# Patient Record
Sex: Male | Born: 1935 | Race: White | Hispanic: No | Marital: Single | Smoking: Former smoker
Health system: Southern US, Community
[De-identification: ages and names within clinical notes are randomized; demographics above are authoritative.]

## PROBLEM LIST (undated history)

## (undated) DIAGNOSIS — R0602 Shortness of breath: Secondary | ICD-10-CM

## (undated) DIAGNOSIS — I5042 Chronic combined systolic (congestive) and diastolic (congestive) heart failure: Secondary | ICD-10-CM

## (undated) DIAGNOSIS — I714 Abdominal aortic aneurysm, without rupture, unspecified: Secondary | ICD-10-CM

## (undated) DIAGNOSIS — I10 Essential (primary) hypertension: Secondary | ICD-10-CM

## (undated) DIAGNOSIS — E785 Hyperlipidemia, unspecified: Secondary | ICD-10-CM

## (undated) DIAGNOSIS — I2510 Atherosclerotic heart disease of native coronary artery without angina pectoris: Secondary | ICD-10-CM

## (undated) DIAGNOSIS — I255 Ischemic cardiomyopathy: Secondary | ICD-10-CM

## (undated) DIAGNOSIS — I480 Paroxysmal atrial fibrillation: Secondary | ICD-10-CM

## (undated) DIAGNOSIS — K219 Gastro-esophageal reflux disease without esophagitis: Secondary | ICD-10-CM

## (undated) DIAGNOSIS — J449 Chronic obstructive pulmonary disease, unspecified: Secondary | ICD-10-CM

## (undated) DIAGNOSIS — G4730 Sleep apnea, unspecified: Secondary | ICD-10-CM

## (undated) DIAGNOSIS — J8410 Pulmonary fibrosis, unspecified: Secondary | ICD-10-CM

## (undated) DIAGNOSIS — I350 Nonrheumatic aortic (valve) stenosis: Secondary | ICD-10-CM

## (undated) HISTORY — PX: LEG SURGERY: SHX1003

## (undated) HISTORY — DX: Sleep apnea, unspecified: G47.30

## (undated) HISTORY — PX: OTHER SURGICAL HISTORY: SHX169

## (undated) HISTORY — DX: Abdominal aortic aneurysm, without rupture: I71.4

## (undated) HISTORY — DX: Chronic obstructive pulmonary disease, unspecified: J44.9

## (undated) HISTORY — DX: Hyperlipidemia, unspecified: E78.5

## (undated) HISTORY — DX: Essential (primary) hypertension: I10

## (undated) HISTORY — DX: Abdominal aortic aneurysm, without rupture, unspecified: I71.40

## (undated) HISTORY — PX: NECK SURGERY: SHX720

## (undated) HISTORY — PX: BACK SURGERY: SHX140

## (undated) HISTORY — DX: Pulmonary fibrosis, unspecified: J84.10

## (undated) HISTORY — DX: Nonrheumatic aortic (valve) stenosis: I35.0

---

## 1998-01-10 ENCOUNTER — Emergency Department (HOSPITAL_COMMUNITY): Admission: EM | Admit: 1998-01-10 | Discharge: 1998-01-10 | Payer: Self-pay | Admitting: Emergency Medicine

## 1998-03-10 ENCOUNTER — Emergency Department (HOSPITAL_COMMUNITY): Admission: EM | Admit: 1998-03-10 | Discharge: 1998-03-10 | Payer: Self-pay | Admitting: Emergency Medicine

## 1998-12-12 ENCOUNTER — Other Ambulatory Visit: Admission: RE | Admit: 1998-12-12 | Discharge: 1998-12-12 | Payer: Self-pay | Admitting: Gastroenterology

## 1999-11-19 ENCOUNTER — Encounter: Payer: Self-pay | Admitting: Cardiology

## 1999-11-19 ENCOUNTER — Ambulatory Visit (HOSPITAL_COMMUNITY): Admission: RE | Admit: 1999-11-19 | Discharge: 1999-11-19 | Payer: Self-pay | Admitting: Cardiology

## 2001-03-26 ENCOUNTER — Emergency Department (HOSPITAL_COMMUNITY): Admission: EM | Admit: 2001-03-26 | Discharge: 2001-03-26 | Payer: Self-pay | Admitting: Emergency Medicine

## 2001-03-26 ENCOUNTER — Encounter: Payer: Self-pay | Admitting: Emergency Medicine

## 2004-05-11 ENCOUNTER — Emergency Department (HOSPITAL_COMMUNITY): Admission: EM | Admit: 2004-05-11 | Discharge: 2004-05-11 | Payer: Self-pay | Admitting: Emergency Medicine

## 2004-06-19 ENCOUNTER — Ambulatory Visit: Payer: Self-pay | Admitting: Internal Medicine

## 2004-07-02 ENCOUNTER — Ambulatory Visit: Payer: Self-pay | Admitting: Internal Medicine

## 2004-07-02 ENCOUNTER — Ambulatory Visit: Payer: Self-pay | Admitting: Endocrinology

## 2004-09-30 ENCOUNTER — Ambulatory Visit: Payer: Self-pay | Admitting: Internal Medicine

## 2004-11-12 ENCOUNTER — Ambulatory Visit: Payer: Self-pay | Admitting: Internal Medicine

## 2005-01-07 ENCOUNTER — Ambulatory Visit: Payer: Self-pay | Admitting: Internal Medicine

## 2005-06-17 ENCOUNTER — Ambulatory Visit: Payer: Self-pay | Admitting: Internal Medicine

## 2005-06-24 ENCOUNTER — Ambulatory Visit: Payer: Self-pay | Admitting: Family Medicine

## 2005-12-11 ENCOUNTER — Ambulatory Visit: Payer: Self-pay | Admitting: Family Medicine

## 2006-01-09 ENCOUNTER — Ambulatory Visit: Payer: Self-pay | Admitting: Family Medicine

## 2006-03-18 ENCOUNTER — Ambulatory Visit: Payer: Self-pay | Admitting: Family Medicine

## 2006-03-25 ENCOUNTER — Encounter: Payer: Self-pay | Admitting: Cardiology

## 2006-03-25 ENCOUNTER — Ambulatory Visit: Payer: Self-pay

## 2006-04-04 ENCOUNTER — Encounter (INDEPENDENT_AMBULATORY_CARE_PROVIDER_SITE_OTHER): Payer: Self-pay | Admitting: *Deleted

## 2006-04-04 ENCOUNTER — Emergency Department (HOSPITAL_COMMUNITY): Admission: EM | Admit: 2006-04-04 | Discharge: 2006-04-04 | Payer: Self-pay | Admitting: Emergency Medicine

## 2006-05-15 ENCOUNTER — Ambulatory Visit: Payer: Self-pay | Admitting: Cardiology

## 2006-06-30 ENCOUNTER — Ambulatory Visit: Payer: Self-pay | Admitting: Family Medicine

## 2006-07-15 ENCOUNTER — Ambulatory Visit: Payer: Self-pay | Admitting: Family Medicine

## 2006-07-15 LAB — CONVERTED CEMR LAB
ALT: 19 units/L (ref 0–40)
AST: 25 units/L (ref 0–37)
Albumin: 3.7 g/dL (ref 3.5–5.2)
Alkaline Phosphatase: 58 units/L (ref 39–117)
BUN: 21 mg/dL (ref 6–23)
GFR calc non Af Amer: 63 mL/min
Glomerular Filtration Rate, Af Am: 76 mL/min/{1.73_m2}
Sodium: 141 meq/L (ref 135–145)
Total Bilirubin: 0.7 mg/dL (ref 0.3–1.2)
Total Protein: 7.3 g/dL (ref 6.0–8.3)
VLDL: 24 mg/dL (ref 0–40)

## 2006-07-16 ENCOUNTER — Ambulatory Visit: Payer: Self-pay | Admitting: Cardiology

## 2006-07-17 ENCOUNTER — Ambulatory Visit: Payer: Self-pay | Admitting: Family Medicine

## 2006-10-26 ENCOUNTER — Ambulatory Visit: Payer: Self-pay | Admitting: Internal Medicine

## 2006-10-27 ENCOUNTER — Encounter: Payer: Self-pay | Admitting: Internal Medicine

## 2006-11-17 ENCOUNTER — Ambulatory Visit: Payer: Self-pay | Admitting: Family Medicine

## 2006-11-17 LAB — CONVERTED CEMR LAB
Eosinophils Absolute: 0.3 10*3/uL (ref 0.0–0.6)
Eosinophils Relative: 4.5 % (ref 0.0–5.0)
Hemoglobin: 14.5 g/dL (ref 13.0–17.0)
MCHC: 33.3 g/dL (ref 30.0–36.0)
Monocytes Relative: 7.2 % (ref 3.0–11.0)
Neutrophils Relative %: 58.5 % (ref 43.0–77.0)
RBC: 5.16 M/uL (ref 4.22–5.81)
TSH: 1.38 microintl units/mL (ref 0.35–5.50)

## 2006-12-24 ENCOUNTER — Ambulatory Visit (HOSPITAL_BASED_OUTPATIENT_CLINIC_OR_DEPARTMENT_OTHER): Admission: RE | Admit: 2006-12-24 | Discharge: 2006-12-24 | Payer: Self-pay | Admitting: Internal Medicine

## 2006-12-24 ENCOUNTER — Encounter: Payer: Self-pay | Admitting: Internal Medicine

## 2007-01-02 ENCOUNTER — Ambulatory Visit: Payer: Self-pay | Admitting: Pulmonary Disease

## 2007-02-10 ENCOUNTER — Ambulatory Visit: Payer: Self-pay | Admitting: Vascular Surgery

## 2007-06-04 ENCOUNTER — Ambulatory Visit: Payer: Self-pay | Admitting: Cardiology

## 2007-06-04 ENCOUNTER — Telehealth (INDEPENDENT_AMBULATORY_CARE_PROVIDER_SITE_OTHER): Payer: Self-pay | Admitting: *Deleted

## 2007-06-04 DIAGNOSIS — J984 Other disorders of lung: Secondary | ICD-10-CM | POA: Insufficient documentation

## 2007-06-14 ENCOUNTER — Ambulatory Visit: Payer: Self-pay | Admitting: Cardiology

## 2007-06-18 ENCOUNTER — Ambulatory Visit: Payer: Self-pay | Admitting: Family Medicine

## 2007-06-21 ENCOUNTER — Ambulatory Visit: Payer: Self-pay | Admitting: Cardiology

## 2007-06-22 ENCOUNTER — Telehealth (INDEPENDENT_AMBULATORY_CARE_PROVIDER_SITE_OTHER): Payer: Self-pay | Admitting: *Deleted

## 2007-06-29 ENCOUNTER — Encounter: Payer: Self-pay | Admitting: Cardiology

## 2007-06-29 ENCOUNTER — Ambulatory Visit: Payer: Self-pay

## 2007-06-29 ENCOUNTER — Encounter: Payer: Self-pay | Admitting: Pulmonary Disease

## 2007-06-29 ENCOUNTER — Ambulatory Visit: Payer: Self-pay | Admitting: Cardiology

## 2007-07-06 ENCOUNTER — Ambulatory Visit: Payer: Self-pay | Admitting: Pulmonary Disease

## 2007-07-20 ENCOUNTER — Ambulatory Visit: Payer: Self-pay | Admitting: Internal Medicine

## 2007-08-11 ENCOUNTER — Ambulatory Visit: Payer: Self-pay | Admitting: Internal Medicine

## 2007-08-11 ENCOUNTER — Ambulatory Visit (HOSPITAL_COMMUNITY): Admission: RE | Admit: 2007-08-11 | Discharge: 2007-08-11 | Payer: Self-pay | Admitting: Internal Medicine

## 2007-08-13 ENCOUNTER — Ambulatory Visit: Payer: Self-pay | Admitting: Pulmonary Disease

## 2007-08-13 DIAGNOSIS — I509 Heart failure, unspecified: Secondary | ICD-10-CM | POA: Insufficient documentation

## 2007-08-13 DIAGNOSIS — G4730 Sleep apnea, unspecified: Secondary | ICD-10-CM | POA: Insufficient documentation

## 2007-08-13 DIAGNOSIS — R599 Enlarged lymph nodes, unspecified: Secondary | ICD-10-CM | POA: Insufficient documentation

## 2007-08-13 DIAGNOSIS — J84111 Idiopathic interstitial pneumonia, not otherwise specified: Secondary | ICD-10-CM | POA: Insufficient documentation

## 2007-08-13 DIAGNOSIS — I2510 Atherosclerotic heart disease of native coronary artery without angina pectoris: Secondary | ICD-10-CM | POA: Insufficient documentation

## 2007-09-27 ENCOUNTER — Encounter: Payer: Self-pay | Admitting: Family Medicine

## 2007-09-27 ENCOUNTER — Inpatient Hospital Stay (HOSPITAL_COMMUNITY): Admission: EM | Admit: 2007-09-27 | Discharge: 2007-09-29 | Payer: Self-pay | Admitting: Emergency Medicine

## 2007-09-27 ENCOUNTER — Ambulatory Visit: Payer: Self-pay | Admitting: Internal Medicine

## 2007-10-12 ENCOUNTER — Ambulatory Visit: Payer: Self-pay | Admitting: Family Medicine

## 2007-10-12 DIAGNOSIS — J209 Acute bronchitis, unspecified: Secondary | ICD-10-CM | POA: Insufficient documentation

## 2007-10-12 DIAGNOSIS — I714 Abdominal aortic aneurysm, without rupture, unspecified: Secondary | ICD-10-CM | POA: Insufficient documentation

## 2007-10-12 DIAGNOSIS — I10 Essential (primary) hypertension: Secondary | ICD-10-CM

## 2007-10-12 DIAGNOSIS — E785 Hyperlipidemia, unspecified: Secondary | ICD-10-CM | POA: Insufficient documentation

## 2007-12-31 ENCOUNTER — Ambulatory Visit: Payer: Self-pay | Admitting: Cardiology

## 2008-01-03 ENCOUNTER — Telehealth (INDEPENDENT_AMBULATORY_CARE_PROVIDER_SITE_OTHER): Payer: Self-pay | Admitting: *Deleted

## 2008-01-24 ENCOUNTER — Encounter: Payer: Self-pay | Admitting: Internal Medicine

## 2008-01-31 ENCOUNTER — Ambulatory Visit: Payer: Self-pay | Admitting: Pulmonary Disease

## 2008-01-31 DIAGNOSIS — F17200 Nicotine dependence, unspecified, uncomplicated: Secondary | ICD-10-CM | POA: Insufficient documentation

## 2008-02-15 ENCOUNTER — Ambulatory Visit: Payer: Self-pay | Admitting: Vascular Surgery

## 2008-02-25 ENCOUNTER — Telehealth (INDEPENDENT_AMBULATORY_CARE_PROVIDER_SITE_OTHER): Payer: Self-pay | Admitting: *Deleted

## 2008-04-21 ENCOUNTER — Telehealth: Payer: Self-pay | Admitting: Family Medicine

## 2008-05-22 ENCOUNTER — Ambulatory Visit: Payer: Self-pay | Admitting: Family Medicine

## 2008-05-22 ENCOUNTER — Telehealth (INDEPENDENT_AMBULATORY_CARE_PROVIDER_SITE_OTHER): Payer: Self-pay | Admitting: *Deleted

## 2008-05-22 DIAGNOSIS — I83893 Varicose veins of bilateral lower extremities with other complications: Secondary | ICD-10-CM | POA: Insufficient documentation

## 2008-05-29 ENCOUNTER — Ambulatory Visit: Payer: Self-pay | Admitting: Family Medicine

## 2008-05-29 DIAGNOSIS — J189 Pneumonia, unspecified organism: Secondary | ICD-10-CM

## 2008-06-12 ENCOUNTER — Ambulatory Visit: Payer: Self-pay | Admitting: Family Medicine

## 2008-07-03 ENCOUNTER — Ambulatory Visit: Payer: Self-pay | Admitting: Family Medicine

## 2008-09-12 ENCOUNTER — Ambulatory Visit: Payer: Self-pay | Admitting: Family Medicine

## 2008-09-13 ENCOUNTER — Ambulatory Visit: Payer: Self-pay | Admitting: Family Medicine

## 2008-09-27 LAB — CONVERTED CEMR LAB
ALT: 16 units/L (ref 0–53)
Albumin: 3.7 g/dL (ref 3.5–5.2)
BUN: 18 mg/dL (ref 6–23)
CO2: 32 meq/L (ref 19–32)
Calcium: 9.3 mg/dL (ref 8.4–10.5)
Cholesterol: 237 mg/dL (ref 0–200)
Creatinine, Ser: 1 mg/dL (ref 0.4–1.5)
Direct LDL: 169.3 mg/dL
Glucose, Bld: 102 mg/dL — ABNORMAL HIGH (ref 70–99)
Total Bilirubin: 0.9 mg/dL (ref 0.3–1.2)
Total CHOL/HDL Ratio: 8.3
Total Protein: 7.1 g/dL (ref 6.0–8.3)
Triglycerides: 184 mg/dL — ABNORMAL HIGH (ref 0–149)

## 2008-09-28 ENCOUNTER — Encounter (INDEPENDENT_AMBULATORY_CARE_PROVIDER_SITE_OTHER): Payer: Self-pay | Admitting: *Deleted

## 2008-10-28 ENCOUNTER — Ambulatory Visit: Payer: Self-pay | Admitting: Diagnostic Radiology

## 2008-10-28 ENCOUNTER — Emergency Department (HOSPITAL_BASED_OUTPATIENT_CLINIC_OR_DEPARTMENT_OTHER): Admission: EM | Admit: 2008-10-28 | Discharge: 2008-10-28 | Payer: Self-pay | Admitting: Emergency Medicine

## 2008-11-15 ENCOUNTER — Ambulatory Visit: Payer: Self-pay | Admitting: Family Medicine

## 2008-11-16 ENCOUNTER — Telehealth (INDEPENDENT_AMBULATORY_CARE_PROVIDER_SITE_OTHER): Payer: Self-pay | Admitting: *Deleted

## 2008-11-20 ENCOUNTER — Telehealth (INDEPENDENT_AMBULATORY_CARE_PROVIDER_SITE_OTHER): Payer: Self-pay | Admitting: *Deleted

## 2008-11-23 ENCOUNTER — Ambulatory Visit: Payer: Self-pay | Admitting: Family Medicine

## 2008-12-01 ENCOUNTER — Ambulatory Visit: Payer: Self-pay | Admitting: Pulmonary Disease

## 2009-01-12 ENCOUNTER — Telehealth (INDEPENDENT_AMBULATORY_CARE_PROVIDER_SITE_OTHER): Payer: Self-pay | Admitting: *Deleted

## 2009-01-15 ENCOUNTER — Telehealth (INDEPENDENT_AMBULATORY_CARE_PROVIDER_SITE_OTHER): Payer: Self-pay | Admitting: *Deleted

## 2009-02-20 ENCOUNTER — Ambulatory Visit: Payer: Self-pay | Admitting: Vascular Surgery

## 2009-03-09 ENCOUNTER — Telehealth (INDEPENDENT_AMBULATORY_CARE_PROVIDER_SITE_OTHER): Payer: Self-pay | Admitting: *Deleted

## 2009-03-27 ENCOUNTER — Ambulatory Visit: Payer: Self-pay | Admitting: Family Medicine

## 2009-03-27 DIAGNOSIS — W57XXXA Bitten or stung by nonvenomous insect and other nonvenomous arthropods, initial encounter: Secondary | ICD-10-CM

## 2009-03-27 DIAGNOSIS — T148 Other injury of unspecified body region: Secondary | ICD-10-CM | POA: Insufficient documentation

## 2009-03-29 LAB — CONVERTED CEMR LAB
Basophils Absolute: 0 10*3/uL (ref 0.0–0.1)
Eosinophils Relative: 8.4 % — ABNORMAL HIGH (ref 0.0–5.0)
Monocytes Relative: 5.9 % (ref 3.0–12.0)
Neutrophils Relative %: 55.4 % (ref 43.0–77.0)
Platelets: 158 10*3/uL (ref 150.0–400.0)
WBC: 5.8 10*3/uL (ref 4.5–10.5)

## 2009-04-02 ENCOUNTER — Telehealth (INDEPENDENT_AMBULATORY_CARE_PROVIDER_SITE_OTHER): Payer: Self-pay | Admitting: *Deleted

## 2009-04-02 ENCOUNTER — Encounter (INDEPENDENT_AMBULATORY_CARE_PROVIDER_SITE_OTHER): Payer: Self-pay | Admitting: *Deleted

## 2009-05-10 ENCOUNTER — Ambulatory Visit: Payer: Self-pay | Admitting: Family Medicine

## 2009-05-24 ENCOUNTER — Telehealth (INDEPENDENT_AMBULATORY_CARE_PROVIDER_SITE_OTHER): Payer: Self-pay | Admitting: *Deleted

## 2009-06-11 ENCOUNTER — Ambulatory Visit: Payer: Self-pay | Admitting: Family Medicine

## 2009-06-11 ENCOUNTER — Telehealth: Payer: Self-pay | Admitting: Family Medicine

## 2009-06-12 ENCOUNTER — Ambulatory Visit (HOSPITAL_BASED_OUTPATIENT_CLINIC_OR_DEPARTMENT_OTHER): Admission: RE | Admit: 2009-06-12 | Discharge: 2009-06-12 | Payer: Self-pay | Admitting: Family Medicine

## 2009-06-12 ENCOUNTER — Ambulatory Visit: Payer: Self-pay | Admitting: Diagnostic Radiology

## 2009-06-14 ENCOUNTER — Telehealth (INDEPENDENT_AMBULATORY_CARE_PROVIDER_SITE_OTHER): Payer: Self-pay | Admitting: *Deleted

## 2009-06-21 ENCOUNTER — Telehealth (INDEPENDENT_AMBULATORY_CARE_PROVIDER_SITE_OTHER): Payer: Self-pay | Admitting: *Deleted

## 2009-07-24 ENCOUNTER — Ambulatory Visit: Payer: Self-pay | Admitting: Family Medicine

## 2009-07-24 DIAGNOSIS — D239 Other benign neoplasm of skin, unspecified: Secondary | ICD-10-CM | POA: Insufficient documentation

## 2009-07-24 DIAGNOSIS — R413 Other amnesia: Secondary | ICD-10-CM | POA: Insufficient documentation

## 2009-07-25 ENCOUNTER — Telehealth: Payer: Self-pay | Admitting: Family Medicine

## 2009-07-25 LAB — CONVERTED CEMR LAB
AST: 20 units/L (ref 0–37)
Albumin: 4.1 g/dL (ref 3.5–5.2)
BUN: 27 mg/dL — ABNORMAL HIGH (ref 6–23)
Basophils Absolute: 0 10*3/uL (ref 0.0–0.1)
CO2: 30 meq/L (ref 19–32)
Calcium: 9.7 mg/dL (ref 8.4–10.5)
Direct LDL: 185.1 mg/dL
Eosinophils Absolute: 1.1 10*3/uL — ABNORMAL HIGH (ref 0.0–0.7)
Folate: 19.6 ng/mL
GFR calc non Af Amer: 68.84 mL/min (ref >60)
Glucose, Bld: 104 mg/dL — ABNORMAL HIGH (ref 70–99)
HCT: 45 % (ref 39.0–52.0)
HDL: 33.8 mg/dL — ABNORMAL LOW (ref >39.00)
Lymphs Abs: 1.9 10*3/uL (ref 0.7–4.0)
MCHC: 32.3 g/dL (ref 30.0–36.0)
Monocytes Relative: 7.5 % (ref 3.0–12.0)
Neutro Abs: 3.9 10*3/uL (ref 1.4–7.7)
Platelets: 151 10*3/uL (ref 150.0–400.0)
Potassium: 4.7 meq/L (ref 3.5–5.1)
RDW: 14.1 % (ref 11.5–14.6)
TSH: 0.99 microintl units/mL (ref 0.35–5.50)
Total Bilirubin: 0.9 mg/dL (ref 0.3–1.2)
Triglycerides: 165 mg/dL — ABNORMAL HIGH (ref 0.0–149.0)
Vitamin B-12: 971 pg/mL — ABNORMAL HIGH (ref 211–911)

## 2009-07-30 ENCOUNTER — Ambulatory Visit: Payer: Self-pay | Admitting: Family Medicine

## 2009-07-30 ENCOUNTER — Encounter: Payer: Self-pay | Admitting: Internal Medicine

## 2009-08-01 ENCOUNTER — Telehealth: Payer: Self-pay | Admitting: Internal Medicine

## 2009-08-01 ENCOUNTER — Telehealth (INDEPENDENT_AMBULATORY_CARE_PROVIDER_SITE_OTHER): Payer: Self-pay | Admitting: *Deleted

## 2009-09-25 ENCOUNTER — Ambulatory Visit: Payer: Self-pay | Admitting: Family Medicine

## 2009-09-25 ENCOUNTER — Telehealth: Payer: Self-pay | Admitting: Family Medicine

## 2009-09-25 LAB — CONVERTED CEMR LAB
Alkaline Phosphatase: 142 units/L — ABNORMAL HIGH (ref 39–117)
Bilirubin, Direct: 0 mg/dL (ref 0.0–0.3)
Total Bilirubin: 0.3 mg/dL (ref 0.3–1.2)
Total Protein: 8.2 g/dL (ref 6.0–8.3)

## 2009-12-11 ENCOUNTER — Ambulatory Visit: Payer: Self-pay | Admitting: Family Medicine

## 2009-12-11 DIAGNOSIS — I739 Peripheral vascular disease, unspecified: Secondary | ICD-10-CM

## 2009-12-11 DIAGNOSIS — R748 Abnormal levels of other serum enzymes: Secondary | ICD-10-CM | POA: Insufficient documentation

## 2009-12-11 DIAGNOSIS — I8310 Varicose veins of unspecified lower extremity with inflammation: Secondary | ICD-10-CM | POA: Insufficient documentation

## 2009-12-12 ENCOUNTER — Telehealth (INDEPENDENT_AMBULATORY_CARE_PROVIDER_SITE_OTHER): Payer: Self-pay | Admitting: *Deleted

## 2009-12-12 LAB — CONVERTED CEMR LAB
Alkaline Phosphatase: 81 units/L (ref 39–117)
Bilirubin, Direct: 0.1 mg/dL (ref 0.0–0.3)
Calcium: 9.2 mg/dL (ref 8.4–10.5)
Creatinine, Ser: 1.2 mg/dL (ref 0.4–1.5)
GFR calc non Af Amer: 65.33 mL/min (ref >60)
HDL: 33.5 mg/dL — ABNORMAL LOW (ref >39.00)
LDL Cholesterol: 138 mg/dL — ABNORMAL HIGH (ref 0–99)
Sodium: 145 meq/L (ref 135–145)
Total Bilirubin: 0.6 mg/dL (ref 0.3–1.2)
Total CHOL/HDL Ratio: 6
Total Protein: 7.1 g/dL (ref 6.0–8.3)
Triglycerides: 126 mg/dL (ref 0.0–149.0)
VLDL: 25.2 mg/dL (ref 0.0–40.0)

## 2009-12-17 ENCOUNTER — Encounter: Payer: Self-pay | Admitting: Family Medicine

## 2010-01-01 ENCOUNTER — Telehealth: Payer: Self-pay | Admitting: Family Medicine

## 2010-01-01 ENCOUNTER — Ambulatory Visit: Payer: Self-pay | Admitting: Vascular Surgery

## 2010-01-01 ENCOUNTER — Encounter: Payer: Self-pay | Admitting: Family Medicine

## 2010-01-21 ENCOUNTER — Telehealth (INDEPENDENT_AMBULATORY_CARE_PROVIDER_SITE_OTHER): Payer: Self-pay | Admitting: *Deleted

## 2010-01-23 ENCOUNTER — Ambulatory Visit: Payer: Self-pay | Admitting: Family Medicine

## 2010-02-05 ENCOUNTER — Ambulatory Visit: Payer: Self-pay | Admitting: Family Medicine

## 2010-02-05 ENCOUNTER — Ambulatory Visit: Payer: Self-pay | Admitting: Diagnostic Radiology

## 2010-02-05 ENCOUNTER — Ambulatory Visit (HOSPITAL_BASED_OUTPATIENT_CLINIC_OR_DEPARTMENT_OTHER): Admission: RE | Admit: 2010-02-05 | Discharge: 2010-02-05 | Payer: Self-pay | Admitting: Family Medicine

## 2010-04-09 ENCOUNTER — Ambulatory Visit: Payer: Self-pay | Admitting: Vascular Surgery

## 2010-04-15 ENCOUNTER — Ambulatory Visit: Payer: Self-pay | Admitting: Vascular Surgery

## 2010-04-18 ENCOUNTER — Telehealth (INDEPENDENT_AMBULATORY_CARE_PROVIDER_SITE_OTHER): Payer: Self-pay | Admitting: *Deleted

## 2010-04-19 ENCOUNTER — Ambulatory Visit: Payer: Self-pay | Admitting: Family Medicine

## 2010-04-22 ENCOUNTER — Ambulatory Visit: Payer: Self-pay | Admitting: Vascular Surgery

## 2010-05-01 ENCOUNTER — Telehealth (INDEPENDENT_AMBULATORY_CARE_PROVIDER_SITE_OTHER): Payer: Self-pay | Admitting: *Deleted

## 2010-05-01 ENCOUNTER — Ambulatory Visit (HOSPITAL_BASED_OUTPATIENT_CLINIC_OR_DEPARTMENT_OTHER): Admission: RE | Admit: 2010-05-01 | Discharge: 2010-05-01 | Payer: Self-pay | Admitting: Family Medicine

## 2010-05-01 ENCOUNTER — Ambulatory Visit: Payer: Self-pay | Admitting: Diagnostic Radiology

## 2010-05-02 ENCOUNTER — Telehealth (INDEPENDENT_AMBULATORY_CARE_PROVIDER_SITE_OTHER): Payer: Self-pay | Admitting: *Deleted

## 2010-05-20 ENCOUNTER — Telehealth: Payer: Self-pay | Admitting: Family Medicine

## 2010-05-21 ENCOUNTER — Ambulatory Visit: Payer: Self-pay | Admitting: Family Medicine

## 2010-07-10 ENCOUNTER — Ambulatory Visit: Payer: Self-pay | Admitting: Family Medicine

## 2010-07-19 ENCOUNTER — Ambulatory Visit: Payer: Self-pay | Admitting: Family Medicine

## 2010-07-19 ENCOUNTER — Telehealth: Payer: Self-pay | Admitting: Family Medicine

## 2010-07-19 LAB — CONVERTED CEMR LAB
ALT: 14 units/L (ref 0–53)
BUN: 27 mg/dL — ABNORMAL HIGH (ref 6–23)
Bilirubin, Direct: 0.1 mg/dL (ref 0.0–0.3)
Chloride: 105 meq/L (ref 96–112)
Direct LDL: 177.1 mg/dL
GFR calc non Af Amer: 70.9 mL/min (ref >60.00)
Glucose, Bld: 114 mg/dL — ABNORMAL HIGH (ref 70–99)
HDL: 34.2 mg/dL — ABNORMAL LOW (ref >39.00)
Potassium: 4.9 meq/L (ref 3.5–5.1)
Sodium: 142 meq/L (ref 135–145)
Total Bilirubin: 0.5 mg/dL (ref 0.3–1.2)
Total Protein: 6.9 g/dL (ref 6.0–8.3)
Triglycerides: 162 mg/dL — ABNORMAL HIGH (ref 0.0–149.0)
VLDL: 32.4 mg/dL (ref 0.0–40.0)

## 2010-07-30 ENCOUNTER — Ambulatory Visit
Admission: RE | Admit: 2010-07-30 | Discharge: 2010-07-30 | Payer: Self-pay | Source: Home / Self Care | Attending: Internal Medicine | Admitting: Internal Medicine

## 2010-08-21 ENCOUNTER — Telehealth (INDEPENDENT_AMBULATORY_CARE_PROVIDER_SITE_OTHER): Payer: Self-pay | Admitting: *Deleted

## 2010-09-03 NOTE — Medication Information (Signed)
Summary: Order for Compression Garment/[facility]  Order for Compression Garment/[facility]   Imported By: Lanelle Bal 01/09/2010 11:12:42  _____________________________________________________________________  External Attachment:    Type:   Image     Comment:   External Document

## 2010-09-03 NOTE — Progress Notes (Signed)
Summary: COMPRESSION STOCKING  Phone Note From Other Clinic   Caller: liz worth([facility]) Summary of Call: rx received for compression stocking, however documentation is needed that pt has tried them before.According to pt dr Laury Axon did about 6 to 8 months ago. If there is documentation the pt can skip the conservative session which is 3 month of wearing the stocking and they can began helping the pt with his varicose vein issues. Please fax same form along with documentation back to [phone], ATTN: LIZ . ph  Initial call taken by: Jeremy Johann CMA,  Jan 01, 2010 4:10 PM  Follow-up for Phone Call        I thought that vascular called asking for documentation.  Ask liz who called earlier---- ok to send another rx for stockings if needed.   Follow-up by: Loreen Freud DO,  Jan 01, 2010 5:10 PM  Additional Follow-up for Phone Call Additional follow up Details #1::        Will fax everything over again. Army Fossa CMA  January 02, 2010 9:03 AM

## 2010-09-03 NOTE — Assessment & Plan Note (Signed)
Summary: congested/cbs   Vital Signs:  Patient profile:   77 year old male Weight:      204.4 pounds O2 Sat:      95 % on Room air Temp:     98.3 degrees F oral Pulse rate:   62 / minute Pulse rhythm:   regular BP sitting:   118 / 60  (left arm)  Vitals Entered By: Almeta Monas CMA [facility]) (April 19, 2010 4:17 PM)  O2 Flow:  Room air CC: c/o cough, sinus pressure and congestion, Cough   History of Present Illness:  Cough      This is a 77 year old man who presents with Cough.  The symptoms began 1 week ago.  Pt taking advil cold and sinus.  The patient reports productive cough, shortness of breath, wheezing, and exertional dyspnea, but denies non-productive cough, pleuritic chest pain, fever, and hemoptysis.  Associated symtpoms include cold/URI symptoms and nasal congestion.  The patient denies the following symptoms: sore throat, chronic rhinitis, weight loss, acid reflux symptoms, and peripheral edema.  The cough is worse with activity and lying down.  Ineffective prior treatments have included OTC cough medication.    Current Medications (verified): 1)  Adult Aspirin Ec Low Strength 81 Mg  Tbec (Aspirin) .Marland Kitchen.. 1 By Mouth Once Daily 2)  Lisinopril-Hydrochlorothiazide 20-12.5 Mg Tabs (Lisinopril-Hydrochlorothiazide) .... 2 By Mouth Once Daily 3)  Lipitor 10 Mg Tabs (Atorvastatin Calcium) .Marland Kitchen.. 1 By Mouth Every Day. 4)  Mvi 5)  Garlic 6)  Advair Diskus 250-50 Mcg/dose Aepb (Fluticasone-Salmeterol) .Marland Kitchen.. 1 Inh Two Times A Day 7)  Biaxin Xl Pac 500 Mg Xr24h-Tab (Clarithromycin) .... 2 By Mouth Once Daily  Allergies (verified): No Known Drug Allergies  Past History:  Past medical, surgical, family and social histories (including risk factors) reviewed for relevance to current acute and chronic problems.  Past Medical History: Reviewed history from 10/12/2007 and no changes required. Coronary Heart Disease - isch CMP 45%, aortic stenosis 1.0 cm211/08 Sleep Apnea aortic  aneurysm, abdominal Pulmonary fibrosis Congestive heart failure pulmonary nodule Hyperlipidemia Hypertension  Past Surgical History: Reviewed history from 12/01/2008 and no changes required. neck surgery arms surgery leg surgery  back surgery  Family History: Reviewed history from 12/01/2008 and no changes required. heart disease- mother rheumatism- mother  Social History: Reviewed history from 10/12/2007 and no changes required. Occupation:TRUCK DRIVER Married Regular exercise-yes Drug use-no  Review of Systems      See HPI  Physical Exam  General:  Well-developed,well-nourished,in no acute distress; alert,appropriate and cooperative throughout examination Ears:  External ear exam shows no significant lesions or deformities.  Otoscopic examination reveals clear canals, tympanic membranes are intact bilaterally without bulging, retraction, inflammation or discharge. Hearing is grossly normal bilaterally. Nose:  External nasal examination shows no deformity or inflammation. Nasal mucosa are pink and moist without lesions or exudates. Mouth:  Oral mucosa and oropharynx without lesions or exudates.  Teeth in good repair. Neck:  No deformities, masses, or tenderness noted. Lungs:  R wheezes and L wheezes.   Heart:  Normal rate and regular rhythm. S1 and S2 normal without gallop, murmur, click, rub or other extra sounds. Skin:  Intact without suspicious lesions or rashes Psych:  Cognition and judgment appear intact. Alert and cooperative with normal attention span and concentration. No apparent delusions, illusions, hallucinations   Impression & Recommendations:  Problem # 1:  ACUTE BRONCHITIS (ICD-466.0)  His updated medication list for this problem includes:    Advair Diskus 250-50 Mcg/dose  Aepb (Fluticasone-salmeterol) .Marland Kitchen... 1 inh two times a day    Biaxin Xl Pac 500 Mg Xr24h-tab (Clarithromycin) .Marland Kitchen... 2 by mouth once daily  Take antibiotics and other medications as  directed. Encouraged to push clear liquids, get enough rest, and take acetaminophen as needed. To be seen in 5-7 days if no improvement, sooner if worse.  Complete Medication List: 1)  Adult Aspirin Ec Low Strength 81 Mg Tbec (Aspirin) .Marland Kitchen.. 1 by mouth once daily 2)  Lisinopril-hydrochlorothiazide 20-12.5 Mg Tabs (Lisinopril-hydrochlorothiazide) .... 2 by mouth once daily 3)  Lipitor 10 Mg Tabs (Atorvastatin calcium) .Marland Kitchen.. 1 by mouth every day. 4)  Mvi  5)  Garlic  6)  Advair Diskus 250-50 Mcg/dose Aepb (Fluticasone-salmeterol) .Marland Kitchen.. 1 inh two times a day 7)  Biaxin Xl Pac 500 Mg Xr24h-tab (Clarithromycin) .... 2 by mouth once daily  Patient Instructions: 1)  Acute Bronchitis symptoms for less then 10 days are not  helped by antibiotics. Take over the counter cough medications. Call if no improvement in 5-7 days, sooner if increasing cough, fever, or new symptoms ( shortness of breath, chest pain) .  Prescriptions: BIAXIN XL PAC 500 MG XR24H-TAB (CLARITHROMYCIN) 2 by mouth once daily  #14 x 0   Entered and Authorized by:   Loreen Freud DO   Signed by:   Loreen Freud DO on 04/19/2010   Method used:   Print then Give to Patient   RxID:   [id number] ADVAIR DISKUS 250-50 MCG/DOSE AEPB (FLUTICASONE-SALMETEROL) 1 inh two times a day  #1 x 2   Entered and Authorized by:   Loreen Freud DO   Signed by:   Loreen Freud DO on 04/19/2010   Method used:   Print then Give to Patient   RxID:   [id number]

## 2010-09-03 NOTE — Progress Notes (Signed)
Summary: QUESTION ABOUT PNEUMONIA SHOT TOMORROW??  Phone Note Call from Patient   Caller: Spouse Summary of Call: PATIENT IS COMING IN TUESDAY 10/18 AT 9:30 FOR FLU SHOT---WIFE MADE APPT AND WANTS TO KNOW IF HE SHOULD GET PNEUMONIA SHOT TOO---(PAPER CHART SHOWS PNEUMONIA SHOT = 09/1999)--DOES  HE NEED ANOTHER  ONE??      IF YES,  CAN HE GET FLU SHOT AND PNEUMONIA SHOT TOGETHER OR SHOULD THEY BE SPACED ONE WEEK APART?? Initial call taken by: Jerolyn Shin,  May 20, 2010 9:33 AM  Follow-up for Phone Call        yes --he can have both Follow-up by: Loreen Freud DO,  May 20, 2010 11:55 AM  Additional Follow-up for Phone Call Additional follow up Details #1::        left pt detail message ok to have both...........Marland KitchenFelecia Deloach CMA  May 20, 2010 12:23 PM

## 2010-09-03 NOTE — Miscellaneous (Signed)
Summary: Orders Update  Medications Added ZOSTAVAX 16109 UNT/0.65ML SOLR (ZOSTER VACCINE LIVE) 1 ml  Im x1       Clinical Lists Changes  Medications: Added new medication of ZOSTAVAX 60454 UNT/0.65ML SOLR (ZOSTER VACCINE LIVE) 1 ml  Im x1 - Signed Rx of ZOSTAVAX 09811 UNT/0.65ML SOLR (ZOSTER VACCINE LIVE) 1 ml  Im x1;  #1 x 0;  Signed;  Entered by: Loreen Freud DO;  Authorized by: Loreen Freud DO;  Method used: Print then Give to Patient    Prescriptions: ZOSTAVAX 91478 UNT/0.65ML SOLR (ZOSTER VACCINE LIVE) 1 ml  Im x1  #1 x 0   Entered and Authorized by:   Loreen Freud DO   Signed by:   Loreen Freud DO on 12/17/2009   Method used:   Print then Give to Patient   RxID:   [id number]

## 2010-09-03 NOTE — Assessment & Plan Note (Signed)
Summary: bp check/cbs   Vital Signs:  Patient profile:   77 year old male Weight:      199.25 pounds Pulse rate:   78 / minute Pulse rhythm:   regular BP sitting:   142 / 70  (left arm) Cuff size:   large  Vitals Entered By: Army Fossa CMA (January 23, 2010 11:10 AM) CC: Pt here for bp follow up   History of Present Illness:  Hypertension follow-up      This is a 77 year old man who presents for Hypertension follow-up.  The patient denies lightheadedness, urinary frequency, headaches, edema, impotence, rash, and fatigue.  The patient denies the following associated symptoms: chest pain, chest pressure, exercise intolerance, dyspnea, palpitations, syncope, leg edema, and pedal edema.  Compliance with medications (by patient report) has been near 100%.  The patient reports that dietary compliance has been good.  Adjunctive measures currently used by the patient include salt restriction.    Current Medications (verified): 1)  Adult Aspirin Ec Low Strength 81 Mg  Tbec (Aspirin) .Marland Kitchen.. 1 By Mouth Once Daily 2)  Lisinopril-Hydrochlorothiazide 20-12.5 Mg Tabs (Lisinopril-Hydrochlorothiazide) .... 2 By Mouth Once Daily 3)  Lipitor 10 Mg Tabs (Atorvastatin Calcium) .Marland Kitchen.. 1 By Mouth Every Other Day. 4)  Mvi 5)  Garlic 6)  Zostavax 14782 Unt/0.50ml Solr (Zoster Vaccine Live) .Marland Kitchen.. 1 Ml Im X1  Allergies (verified): No Known Drug Allergies  Past History:  Past medical, surgical, family and social histories (including risk factors) reviewed for relevance to current acute and chronic problems.  Past Medical History: Reviewed history from 10/12/2007 and no changes required. Coronary Heart Disease - isch CMP 45%, aortic stenosis 1.0 cm211/08 Sleep Apnea aortic aneurysm, abdominal Pulmonary fibrosis Congestive heart failure pulmonary nodule Hyperlipidemia Hypertension  Past Surgical History: Reviewed history from 12/01/2008 and no changes required. neck surgery arms surgery leg surgery   back surgery  Family History: Reviewed history from 12/01/2008 and no changes required. heart disease- mother rheumatism- mother  Social History: Reviewed history from 10/12/2007 and no changes required. Occupation:TRUCK DRIVER Married Regular exercise-yes Drug use-no  Review of Systems      See HPI  Physical Exam  General:  Well-developed,well-nourished,in no acute distress; alert,appropriate and cooperative throughout examination Lungs:  Normal respiratory effort, chest expands symmetrically. Lungs are clear to auscultation, no crackles or wheezes. Heart:  normal rate and no murmur.   Extremities:  No clubbing, cyanosis, edema, or deformity noted with normal full range of motion of all joints.   Psych:  Oriented X3 and normally interactive.     Impression & Recommendations:  Problem # 1:  HYPERTENSION (ICD-401.9)  His updated medication list for this problem includes:    Lisinopril-hydrochlorothiazide 20-12.5 Mg Tabs (Lisinopril-hydrochlorothiazide) .Marland Kitchen... 2 by mouth once daily  BP today: 142/70 Prior BP: 122/76 (12/11/2009)  Labs Reviewed: K+: 4.7 (12/11/2009) Creat: : 1.2 (12/11/2009)   Chol: 197 (12/11/2009)   HDL: 33.50 (12/11/2009)   LDL: 138 (12/11/2009)   TG: 126.0 (12/11/2009)  Orders: Prescription Created Electronically [phone])  Problem # 2:  HYPERLIPIDEMIA (ICD-272.4)  His updated medication list for this problem includes:    Lipitor 10 Mg Tabs (Atorvastatin calcium) .Marland Kitchen... 1 by mouth every other day.  Labs Reviewed: SGOT: 20 (12/11/2009)   SGPT: 13 (12/11/2009)   HDL:33.50 (12/11/2009), 33.80 (07/24/2009)  LDL:138 (12/11/2009), DEL (09/13/2008)  Chol:197 (12/11/2009), 265 (07/24/2009)  Trig:126.0 (12/11/2009), 165.0 (07/24/2009)  Orders: Prescription Created Electronically [phone])  Complete Medication List: 1)  Adult Aspirin Ec Low Strength 81  Mg Tbec (Aspirin) .Marland Kitchen.. 1 by mouth once daily 2)  Lisinopril-hydrochlorothiazide 20-12.5 Mg Tabs  (Lisinopril-hydrochlorothiazide) .... 2 by mouth once daily 3)  Lipitor 10 Mg Tabs (Atorvastatin calcium) .Marland Kitchen.. 1 by mouth every other day. 4)  Mvi  5)  Garlic  6)  Zostavax 29562 Unt/0.71ml Solr (Zoster vaccine live) .Marland Kitchen.. 1 ml im x1  Patient Instructions: 1)  Please schedule a follow-up appointment in 6 months -- CPE 2)  fasting labs august---lipid, hep, bmp 272.4  401.9 Prescriptions: ZOSTAVAX 13086 UNT/0.65ML SOLR (ZOSTER VACCINE LIVE) 1 ml IM x1  #1 x 0   Entered and Authorized by:   Loreen Freud DO   Signed by:   Loreen Freud DO on 01/23/2010   Method used:   Print then Give to Patient   RxID:   [id number] LISINOPRIL-HYDROCHLOROTHIAZIDE 20-12.5 MG TABS (LISINOPRIL-HYDROCHLOROTHIAZIDE) 2 by mouth once daily  #180 x 3   Entered and Authorized by:   Loreen Freud DO   Signed by:   Loreen Freud DO on 01/23/2010   Method used:   Electronically to        [facility] # [id number]* (retail)       [address]       [place], [place]  [address]       Ph: [phone]       Fax: [phone]   RxID:   [id number] LISINOPRIL-HYDROCHLOROTHIAZIDE 20-12.5 MG TABS (LISINOPRIL-HYDROCHLOROTHIAZIDE) 2 by mouth once daily  #180 x 3   Entered and Authorized by:   Loreen Freud DO   Signed by:   Loreen Freud DO on 01/23/2010   Method used:   Faxed to ...       [facility]  [address]  [id number] ([facility]ail[facility]       [address]       [place], [place]  [address]       Ph: [phone] or [phone]       Fax: [phone]   RxID:   [id number]

## 2010-09-03 NOTE — Progress Notes (Signed)
Summary: Pt not feeling well  Phone Note Call from Patient   Caller: Spouse Summary of Call: wife [name] called and said pt is having allergy symptoms and sounds congested no fever but a wheezing/whisteling sound. and wanted to know if there was anything we can do with him coming in. adv that pt will need to be seen so we can be sure he is not having anything serious going on with the him. xfrd call to appts. Initial call taken by: Almeta Monas CMA [facility]),  April 18, 2010 3:10 PM

## 2010-09-03 NOTE — Progress Notes (Signed)
Summary: Lab Results  Phone Note Outgoing Call   Call placed by: Army Fossa CMA,  Dec 12, 2009 1:16 PM Summary of Call: Lab Results, LMTCB:  try to increase lipitor to every day---LDL too high recheck 3 months---lipid, hep 272.4 Signed by Loreen Freud DO on 12/12/2009 at 12:31 PM  Alkaline Phosphate: Normal  Follow-up for Phone Call        Left message to call back. Army Fossa CMA  Dec 13, 2009 8:58 AM   Additional Follow-up for Phone Call Additional follow up Details #1::        D/W patient's wife, copy of labs mailed. Patient's wife indicated she will call back to schedule appointment for recheck. Additional Follow-up by: Shonna Chock,  Dec 14, 2009 9:55 AM

## 2010-09-03 NOTE — Assessment & Plan Note (Signed)
Summary: discomfort in leg//fd   Vital Signs:  Patient profile:   77 year old male Height:      68 inches Weight:      197 pounds BMI:     30.06 Pulse rate:   76 / minute Pulse rhythm:   regular BP sitting:   122 / 76  (left arm) Cuff size:   large  Vitals Entered By: Army Fossa CMA (Dec 11, 2009 8:43 AM) CC: Pt here c/o pain in left lower leg when he is doing a lot of walking.   History of Present Illness: Pt here c/o L calf pain with walking only.  Pt states he can walk about 400 ft then the back of his leg starts to hurt.  He recently noticed a sore on the back of his leg.  It has been bruised for almost 2 years.  Sore he noticed today.   No other complaints.    Preventive Screening-Counseling & Management  Alcohol-Tobacco     Alcohol drinks/day: 0     Smoking Status: current     Smoking Cessation Counseling: yes     Smoke Cessation Stage: not ready     Packs/Day: 0.75     Year Started: 72 yrs     Cans of tobacco/week: yes  Caffeine-Diet-Exercise     Does Patient Exercise: no  Current Medications (verified): 1)  Adult Aspirin Ec Low Strength 81 Mg  Tbec (Aspirin) .Marland Kitchen.. 1 By Mouth Once Daily 2)  Lisinopril-Hydrochlorothiazide 20-12.5 Mg Tabs (Lisinopril-Hydrochlorothiazide) .... 2 By Mouth Once Daily 3)  Lipitor 10 Mg Tabs (Atorvastatin Calcium) .Marland Kitchen.. 1 By Mouth Every Other Day.  Allergies (verified): No Known Drug Allergies  Past History:  Past medical, surgical, family and social histories (including risk factors) reviewed for relevance to current acute and chronic problems.  Past Medical History: Reviewed history from 10/12/2007 and no changes required. Coronary Heart Disease - isch CMP 45%, aortic stenosis 1.0 cm211/08 Sleep Apnea aortic aneurysm, abdominal Pulmonary fibrosis Congestive heart failure pulmonary nodule Hyperlipidemia Hypertension  Past Surgical History: Reviewed history from 12/01/2008 and no changes required. neck surgery arms  surgery leg surgery  back surgery  Family History: Reviewed history from 12/01/2008 and no changes required. heart disease- mother rheumatism- mother  Social History: Reviewed history from 10/12/2007 and no changes required. Occupation:TRUCK DRIVER Married Regular exercise-yes Drug use-no Packs/Day:  0.75 Does Patient Exercise:  no  Review of Systems      See HPI  Physical Exam  General:  Well-developed,well-nourished,in no acute distress; alert,appropriate and cooperative throughout examination Lungs:  Normal respiratory effort, chest expands symmetrically. Lungs are clear to auscultation, no crackles or wheezes. Heart:  normal rate and Grade   3/6 systolic ejection murmur.   Extremities:  varicose veins b/l low ext groin to ankle L calf--+ ecchymosis with small ulceration--no signs of infections no calf pain net hommens sighn Skin:  see above Psych:  Oriented X3 and normally interactive.     Impression & Recommendations:  Problem # 1:  VARICOSE VEINS, LOWER EXTREMITIES, WITH INFLAMMATION (ICD-454.1) encouraged pt to wear compression stockings---he has them at home Orders: [facility] (Vascular)  Problem # 2:  CLAUDICATION, INTERMITTENT (ICD-443.9)  Orders: LE Arterial Doppler/ABI (Le arterial doppler) [facility] (Vascular) Venipuncture (82956) TLB-Lipid Panel (80061-LIPID) TLB-BMP (Basic Metabolic Panel-BMET) (80048-METABOL) TLB-Hepatic/Liver Function Pnl (80076-HEPATIC)  Problem # 3:  ALKALINE PHOSPHATASE, ELEVATED (ICD-790.5)  Orders: T- * Misc. Laboratory test [phone])  Problem # 4:  TOBACCO ABUSE (ICD-305.1)  Encouraged smoking cessation  and discussed different methods for smoking cessation.   Problem # 5:  HYPERTENSION (ICD-401.9)  His updated medication list for this problem includes:    Lisinopril-hydrochlorothiazide 20-12.5 Mg Tabs (Lisinopril-hydrochlorothiazide) .Marland Kitchen... 2 by mouth once daily  Orders: Venipuncture (09811) TLB-Lipid  Panel (80061-LIPID) TLB-BMP (Basic Metabolic Panel-BMET) (80048-METABOL) TLB-Hepatic/Liver Function Pnl (80076-HEPATIC)  BP today: 122/76 Prior BP: 126/82 (07/24/2009)  Labs Reviewed: K+: 4.7 (07/24/2009) Creat: : 1.1 (07/24/2009)   Chol: 265 (07/24/2009)   HDL: 33.80 (07/24/2009)   LDL: DEL (09/13/2008)   TG: 165.0 (07/24/2009)  Problem # 6:  HYPERLIPIDEMIA (ICD-272.4)  His updated medication list for this problem includes:    Lipitor 10 Mg Tabs (Atorvastatin calcium) .Marland Kitchen... 1 by mouth every other day.  Orders: Venipuncture (91478) TLB-Lipid Panel (80061-LIPID) TLB-BMP (Basic Metabolic Panel-BMET) (80048-METABOL) TLB-Hepatic/Liver Function Pnl (80076-HEPATIC)  Labs Reviewed: SGOT: 17 (09/25/2009)   SGPT: 13 (09/25/2009)   HDL:33.80 (07/24/2009), 28.6 (09/13/2008)  LDL:DEL (09/13/2008), 114 (07/15/2006)  Chol:265 (07/24/2009), 237 (09/13/2008)  Trig:165.0 (07/24/2009), 184 (09/13/2008)  Problem # 7:  CONGESTIVE HEART FAILURE (ICD-428.0)  His updated medication list for this problem includes:    Adult Aspirin Ec Low Strength 81 Mg Tbec (Aspirin) .Marland Kitchen... 1 by mouth once daily    Lisinopril-hydrochlorothiazide 20-12.5 Mg Tabs (Lisinopril-hydrochlorothiazide) .Marland Kitchen... 2 by mouth once daily  Orders: Venipuncture (29562) TLB-Lipid Panel (80061-LIPID) TLB-BMP (Basic Metabolic Panel-BMET) (80048-METABOL) TLB-Hepatic/Liver Function Pnl (80076-HEPATIC)  Problem # 8:  CORONARY HEART DISEASE (ICD-414.00)  His updated medication list for this problem includes:    Adult Aspirin Ec Low Strength 81 Mg Tbec (Aspirin) .Marland Kitchen... 1 by mouth once daily    Lisinopril-hydrochlorothiazide 20-12.5 Mg Tabs (Lisinopril-hydrochlorothiazide) .Marland Kitchen... 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 1 by mouth once daily 2)  Lisinopril-hydrochlorothiazide 20-12.5 Mg Tabs (Lisinopril-hydrochlorothiazide) .... 2 by mouth once daily 3)  Lipitor 10 Mg Tabs (Atorvastatin calcium) .Marland Kitchen.. 1 by mouth every other day.  Patient Instructions: 1)  Please make a f/u appointment with Dr Vassie Loll in Pulmonary 2)  Please make a f/u appointment with Cardiology

## 2010-09-03 NOTE — Progress Notes (Signed)
Summary: Pt still feels bad  Phone Note Outgoing Call   Details for Reason: Results/Pt still sick Summary of Call: called to give pt resullts of chest x-ray. Wife Clotilde Dieter advised that pt was still feeling really bad, said he is coughing up yelow phelgm and has a low grade temp of 100.0 wanted to know if he could get another antibiotic and pt is using advair but still wheezing. C/B [phone]. Please advise  Initial call taken by: Almeta Monas CMA [facility]),  May 02, 2010 12:04 PM  Follow-up for Phone Call        avelox 400 1 by mouth once daily for 10 days---ov if no better after that Follow-up by: Loreen Freud DO,  May 02, 2010 12:51 PM  Additional Follow-up for Phone Call Additional follow up Details #1::        spoke w/ patient wife aware prescription sent to pharmacy .....Marland KitchenMarland KitchenDoristine Devoid CMA  May 02, 2010 3:16 PM     New/Updated Medications: AVELOX 400 MG TABS (MOXIFLOXACIN HCL) take one tablet daily x10 days Prescriptions: AVELOX 400 MG TABS (MOXIFLOXACIN HCL) take one tablet daily x10 days  #10 x 0   Entered by:   Doristine Devoid CMA   Authorized by:   Loreen Freud DO   Signed by:   Doristine Devoid CMA on 05/02/2010   Method used:   Printed then [facility] ...       [facility]  [address]  [id number]ail[facility]       [address]       [place], [place]  [address]       Ph: [phone] or [phone]       Fax: [phone]   RxID:   [id number]

## 2010-09-03 NOTE — Progress Notes (Signed)
Summary: Refill Request-APPT scheduled [id number]  Phone Note Refill Request Call back at [phone] Message from:  Pharmacy on January 21, 2010 9:19 AM  Refills Requested: Medication #1:  LISINOPRIL-HYDROCHLOROTHIAZIDE 20-12.5 MG TABS 2 by mouth once daily   Supply Requested: 3 months   Notes: Patient is requesting a 90-day supply [facility] [phone]  Next Appointment Scheduled: none Initial call taken by: Harold Barban,  January 21, 2010 9:19 AM  Follow-up for Phone Call        Pt needs BP Follow up before additional refills. Army Fossa CMA  January 21, 2010 9:32 AM   Additional Follow-up for Phone Call Additional follow up Details #1::        patient has appt [id number] .Marland KitchenOkey Regal [place]  January 21, 2010 4:26 PM     Prescriptions: LISINOPRIL-HYDROCHLOROTHIAZIDE 20-12.5 MG TABS (LISINOPRIL-HYDROCHLOROTHIAZIDE) 2 by mouth once daily  #120 Tablet x 0   Entered by:   Army Fossa CMA   Authorized by:   Loreen Freud DO   Signed by:   Army Fossa CMA on 01/21/2010   Method used:   [facility] ...       [facility]  [address]  [id number] ([facility]ail[facility]       [address]       [place], [place]  [address]       Ph: [phone] or [phone]       Fax: [phone]   RxID:   [id number]

## 2010-09-03 NOTE — Assessment & Plan Note (Signed)
Summary: FLU SHOT AND PNEUMONIA SHOT///SPH   Nurse Visit   Allergies: No Known Drug Allergies  Immunizations Administered:  Pneumonia Vaccine:    Vaccine Type: Pneumovax    Site: right deltoid    Mfr: Merck    Dose: 0.5 ml    Route: IM    Given by: Jeremy Johann CMA    Exp. Date: 10/21/2011    Lot #: [id number]    VIS given: 07/09/09 version given May 21, 2010.  Orders Added: 1)  Admin 1st Vaccine [90471] 2)  Flu Vaccine 49yrs + [62952] 3)  Pneumococcal Vaccine [90732] 4)  Admin of Any Addtl Vaccine [84132] Flu Vaccine Consent Questions     Do you have a history of severe allergic reactions to this vaccine? no    Any prior history of allergic reactions to egg and/or gelatin? no    Do you have a sensitivity to the preservative Thimersol? no    Do you have a past history of Guillan-Barre Syndrome? no    Do you currently have an acute febrile illness? no    Have you ever had a severe reaction to latex? no    Vaccine information given and explained to patient? yes    Are you currently pregnant? no    Lot Number:[id number]   Exp Date:02/01/2011   Site Given  Left Deltoid IM .lbflu

## 2010-09-03 NOTE — Progress Notes (Signed)
Summary: cough no better-  Phone Note Call from Patient Call back at Home Phone [phone]   Caller: Patient Summary of Call: Patient wife called says patient isn't feeling any better offered appt for patient to see [facility] doesn't want to see another physician would like to go to get chest xray then come in if needed. Wife reports that cough is terrible and that she is concerned about pneumonia informed her then her really needs appt to be seen but refused. Initial call taken by: Doristine Devoid CMA,  May 01, 2010 9:12 AM  Follow-up for Phone Call        xray ordered---they can go to [facility],  If they want to go to [place] they need to p/u order first.   Follow-up by: Loreen Freud DO,  May 01, 2010 11:31 AM  Additional Follow-up for Phone Call Additional follow up Details #1::        left message on machine ........Marland KitchenDoristine Devoid CMA  May 01, 2010 11:45 AM   spoke w/ patient wife will come to pick up xray order and have done at [facility]....Marland KitchenMarland KitchenDoristine Devoid CMA  May 01, 2010 11:57 AM

## 2010-09-03 NOTE — Assessment & Plan Note (Signed)
Summary: POSSIBLE SINUS PROBLEMS//KN   Vital Signs:  Patient profile:   77 year old male Height:      68 inches (172.72 cm) Weight:      196.50 pounds (89.32 kg) BMI:     29.99 O2 Sat:      97 % on Room air Temp:     98.1 degrees F (36.72 degrees C) oral Pulse rate:   49 / minute BP sitting:   120 / 56  (right arm)  Vitals Entered By: Lucious Groves (February 05, 2010 10:59 AM)  O2 Flow:  Room air CC: Est pt C/O fatigue, being lethargic, and sinus congestion/allergy issue./kb, URI symptoms Is Patient Diabetic? No Pain Assessment Patient in pain? no      Comments Requests refill of Lipitor (90 day supply). Pt notes that he is not taking any vitamins right now/kb   History of Present Illness:       This is a 77 year old man who presents with URI symptoms.  The symptoms began 1 week ago.  The patient complains of nasal congestion, purulent nasal discharge, productive cough, and sick contacts, but denies earache.  Associated symptoms include dyspnea and wheezing.  The patient denies fever, low-grade fever (<100.5 degrees), fever of 100.5-103 degrees, fever of 103.1-104 degrees, fever to >104 degrees, stiff neck, rash, vomiting, diarrhea, use of an antipyretic, and response to antipyretic.  The patient also reports sneezing and severe fatigue.  The patient denies itchy watery eyes, itchy throat, seasonal symptoms, response to antihistamine, headache, and muscle aches.  The patient denies the following risk factors for Strep sinusitis: unilateral facial pain, unilateral nasal discharge, poor response to decongestant, double sickening, tooth pain, Strep exposure, tender adenopathy, and absence of cough.  Pt has been using neti pot with good results.    Preventive Screening-Counseling & Management  Alcohol-Tobacco     Alcohol drinks/day: 0     Smoking Status: current     Smoking Cessation Counseling: yes     Smoke Cessation Stage: not ready     Packs/Day: 0.75     Year Started: 72 yrs  Cans of tobacco/week: yes  Caffeine-Diet-Exercise     Does Patient Exercise: no  Current Medications (verified): 1)  Adult Aspirin Ec Low Strength 81 Mg  Tbec (Aspirin) .Marland Kitchen.. 1 By Mouth Once Daily 2)  Lisinopril-Hydrochlorothiazide 20-12.5 Mg Tabs (Lisinopril-Hydrochlorothiazide) .... 2 By Mouth Once Daily 3)  Lipitor 10 Mg Tabs (Atorvastatin Calcium) .Marland Kitchen.. 1 By Mouth Every Other Day. 4)  Mvi 5)  Garlic 6)  Zithromax Z-Pak 250 Mg Tabs (Azithromycin) .... As Directed 7)  Prednisone 10 Mg Tabs (Prednisone) .... 3 By Mouth Once Daily For 3 Days Then 2 By Mouth Once Daily For 3 Days Then 1 By Mouth Once Daily For 3 Days  Allergies (verified): No Known Drug Allergies  Past History:  Past medical, surgical, family and social histories (including risk factors) reviewed for relevance to current acute and chronic problems.  Past Medical History: Reviewed history from 10/12/2007 and no changes required. Coronary Heart Disease - isch CMP 45%, aortic stenosis 1.0 cm211/08 Sleep Apnea aortic aneurysm, abdominal Pulmonary fibrosis Congestive heart failure pulmonary nodule Hyperlipidemia Hypertension  Past Surgical History: Reviewed history from 12/01/2008 and no changes required. neck surgery arms surgery leg surgery  back surgery  Family History: Reviewed history from 12/01/2008 and no changes required. heart disease- mother rheumatism- mother  Social History: Reviewed history from 10/12/2007 and no changes required. Occupation:TRUCK DRIVER Married Regular exercise-yes Drug  use-no  Review of Systems      See HPI  Physical Exam  General:  Well-developed,well-nourished,in no acute distress; alert,appropriate and cooperative throughout examination Ears:  External ear exam shows no significant lesions or deformities.  Otoscopic examination reveals clear canals, tympanic membranes are intact bilaterally without bulging, retraction, inflammation or discharge. Hearing is grossly  normal bilaterally. Nose:  External nasal examination shows no deformity or inflammation. Nasal mucosa are pink and moist without lesions or exudates. Mouth:  Oral mucosa and oropharynx without lesions or exudates.  Teeth in good repair. Lungs:  normal respiratory effort, no intercostal retractions, R wheezes, and L wheezes.   Heart:  normal rate and no murmur.   Extremities:  No clubbing, cyanosis, edema, or deformity noted with normal full range of motion of all joints.   Cervical Nodes:  No lymphadenopathy noted Psych:  Oriented X3 and normally interactive.     Impression & Recommendations:  Problem # 1:  BRONCHITIS- ACUTE (ICD-466.0)  Orders: T-2 View CXR (71020TC) Nebulizer Tx ([address]109) Depo- Medrol 80mg  (J1040) Admin of Therapeutic Inj  intramuscular or subcutaneous (60454)  His updated medication list for this problem includes:    Zithromax Z-pak 250 Mg Tabs (Azithromycin) .Marland Kitchen... As directed  Take antibiotics and other medications as directed. Encouraged to push clear liquids, get enough rest, and take acetaminophen as needed. To be seen in 5-7 days if no improvement, sooner if worse.  Complete Medication List: 1)  Adult Aspirin Ec Low Strength 81 Mg Tbec (Aspirin) .Marland Kitchen.. 1 by mouth once daily 2)  Lisinopril-hydrochlorothiazide 20-12.5 Mg Tabs (Lisinopril-hydrochlorothiazide) .... 2 by mouth once daily 3)  Lipitor 10 Mg Tabs (Atorvastatin calcium) .Marland Kitchen.. 1 by mouth every other day. 4)  Mvi  5)  Garlic  6)  Zithromax Z-pak 098 Mg Tabs (Azithromycin) .... As directed 7)  Prednisone 10 Mg Tabs (Prednisone) .... 3 by mouth once daily for 3 days then 2 by mouth once daily for 3 days then 1 by mouth once daily for 3 days Prescriptions: PREDNISONE 10 MG TABS (PREDNISONE) 3 by mouth once daily for 3 days then 2 by mouth once daily for 3 days then 1 by mouth once daily for 3 days  #18 x 0   Entered and Authorized by:   Loreen Freud DO   Signed by:   Loreen Freud DO on 02/05/2010   Method  used:   [facility] ...       [facility]  [address]  [id number] ([facility]ail[facility]       [address]       [place], [place]  [address]       Ph: [phone] or [phone]       Fax: [phone]   RxID:   [id number] ZITHROMAX Z-PAK 250 MG TABS (AZITHROMYCIN) as directed  #1 x 0   Entered and Authorized by:   Loreen Freud DO   Signed by:   Loreen Freud DO on 02/05/2010   Method used:   Faxed to ...       [facility]  [address]  [phone] (retail)       [address]       [place], [place]  [address]       Ph: [phone] or [phone]       Fax: [phone]   RxID:   [id number]    Medication Administration  Injection # 1:    Medication: Depo- Medrol 80mg     Diagnosis: BRONCHITIS- ACUTE (ICD-466.0)    Route:  IM    Site: RUOQ gluteus    Exp Date: 06/04/2010    Lot #: [id number]    Mfr: novaplus    Patient tolerated injection without complications    Given by: Lucious Groves (February 05, 2010 12:05 PM)  Orders Added: 1)  T-2 View CXR [71020TC] 2)  Est. Patient Level IV [47829] 3)  Nebulizer Tx [56213] 4)  Depo- Medrol 80mg  [J1040] 5)  Admin of Therapeutic Inj  intramuscular or subcutaneous [96372]  Appended Document: Orders Update     Clinical Lists Changes  Medications: Rx of LIPITOR 10 MG TABS (ATORVASTATIN CALCIUM) 1 by mouth every other day.;  #90 x 1;  Signed;  Entered by: Loreen Freud DO;  Authorized by: Loreen Freud DO;  Method used: Print then Give to Patient Rx of LIPITOR 10 MG TABS (ATORVASTATIN CALCIUM) 1 by mouth every other day.;  #90 x 1;  Signed;  Entered by: Loreen Freud DO;  Authorized by: Loreen Freud DO;  Method used: [facility]  [id number], [address], , [place]  [address], Ph: [phone] or [phone], Fax: [phone]    Prescriptions: LIPITOR 10 MG TABS (ATORVASTATIN CALCIUM) 1 by mouth every other day.  #90 x 1   Entered and Authorized by:   Loreen Freud DO   Signed by:   Loreen Freud DO on 02/05/2010   Method used:   Faxed to ...       [facility]  [id number] (retail)        [address]       [place], [place]  [address]       Ph: [phone] or [phone]       Fax: [phone]   RxID:   [id number] LIPITOR 10 MG TABS (ATORVASTATIN CALCIUM) 1 by mouth every other day.  #90 x 1   Entered and Authorized by:   Loreen Freud DO   Signed by:   Loreen Freud DO on 02/05/2010   Method used:   Print then Give to Patient   RxID:   [id number]

## 2010-09-03 NOTE — Progress Notes (Signed)
Summary: Lab results   Phone Note Outgoing Call   Call placed by: Army Fossa CMA,  September 25, 2009 5:05 PM Summary of Call: Regarding lab results, LMTCB:  improved--- cont off otc meds---recheck 3 months 790.4  hep panal Signed by Loreen Freud DO on 09/25/2009 at 4:58 PM  Follow-up for Phone Call        [facility] Army Fossa CMA  September 26, 2009 1:46 PM   Additional Follow-up for Phone Call Additional follow up Details #1::        I spoke with pts wife informed her off this- she would like to know if he can his vitamins. Army Fossa CMA  September 26, 2009 2:23 PM     Additional Follow-up for Phone Call Additional follow up Details #2::    MVI ok but i want him to stay off otc meds Follow-up by: Loreen Freud DO,  September 26, 2009 2:26 PM  Additional Follow-up for Phone Call Additional follow up Details #3:: Details for Additional Follow-up Action Taken: left message informing pt of this. Army Fossa CMA  September 26, 2009 3:07 PM

## 2010-09-05 NOTE — Progress Notes (Signed)
Summary: cough and congestion  Phone Note Call from Patient   Caller: Spouse Call For: Loreen Freud DO Details for Reason: Cough Summary of Call: spoke with Wife Rosa and she stated that the patient is having a cough, congestion, drainage and yellow sputum. They have been trying mucinex-d with no relief. I advised they will need and appt to be evaluated, she stated she would like to see if Dr.Lowne would call something in because he has the same issues all of the time. c/b is [phone].Marland KitchenMarland KitchenMarland Kitchenplease advised Initial call taken by: Almeta Monas CMA Duncan Dull),  July 19, 2010 1:42 PM Call placed to: Patient  Follow-up for Phone Call        because of copd and his other health issues he needs ov because he can get too sick too fast Follow-up by: Loreen Freud DO,  July 19, 2010 2:01 PM  Additional Follow-up for Phone Call Additional follow up Details #1::        appt scheduled today at 4:15 Additional Follow-up by: Almeta Monas CMA Duncan Dull),  July 19, 2010 2:59 PM

## 2010-09-05 NOTE — Assessment & Plan Note (Signed)
Summary: still has bronchitis///sph---Dr Laury Axon patient///sph   Vital Signs:  Patient profile:   77 year old male Weight:      202 pounds O2 Sat:      94 % on Room air Temp:     98.2 degrees F oral Pulse rate:   69 / minute Pulse rhythm:   regular BP sitting:   130 / 84  (left arm) Cuff size:   large  Vitals Entered By: Army Fossa CMA (July 30, 2010 1:29 PM)  O2 Flow:  Room air CC: c/o feeling very congested worse at night- has been on ATB.  Comments possible gout in (R) great toe swollen x 3-4 days wants rx printed    History of Present Illness: here with his wife was seen 12/16, dx with bronchitis, got a Depo-Medrol and Avelox He finished the antibiotics 3 days ago Overall feels better but not completely well. Still coughing and having chest congestion.  Also 2 days history of pain and swelling at the right great toe. Gout? Never told "officially"  he has  gout but these episodes have been on and off, about once a year  ROS No fever Still producing white, thick sputum, no hemoptysis Difficulty breathing decreased Wheezing has decreased as well Has  a nebulizer at home that he uses as needed  Current Medications (verified): 1)  Adult Aspirin Ec Low Strength 81 Mg  Tbec (Aspirin) .Marland Kitchen.. 1 By Mouth Once Daily 2)  Lisinopril-Hydrochlorothiazide 20-12.5 Mg Tabs (Lisinopril-Hydrochlorothiazide) .... 2 By Mouth Once Daily 3)  Lipitor 10 Mg Tabs (Atorvastatin Calcium) .Marland Kitchen.. 1 By Mouth At Bedtime 4)  Mvi 5)  Garlic 6)  Advair Diskus 250-50 Mcg/dose Aepb (Fluticasone-Salmeterol) .Marland Kitchen.. 1 Inh Two Times A Day 7)  Nebulizer Compressor  Misc (Nebulizers) .... As Directed 8)  Duoneb 0.5-2.5 (3) Mg/31ml Soln (Ipratropium-Albuterol) .... 3ml Neb Qid  Allergies (verified): No Known Drug Allergies  Past History:  Past Medical History: Reviewed history from 10/12/2007 and no changes required. Coronary Heart Disease - isch CMP 45%, aortic stenosis 1.0 cm211/08 Sleep  Apnea aortic aneurysm, abdominal Pulmonary fibrosis Congestive heart failure pulmonary nodule Hyperlipidemia Hypertension  Past Surgical History: Reviewed history from 12/01/2008 and no changes required. neck surgery arms surgery leg surgery  back surgery  Physical Exam  General:  alert and well-developed.   Ears:  wax  bilaterally, TMs poorly seen  but normal Nose:  no congested  Mouth:  no redness or discharge Lungs:  normal respiratory effort and no intercostal retractions.  decreased breath sounds bilaterally, few rhonchi with cough, slight increased expiratory time Heart:  normal rate.   Extremities:  base of the right great toe slightly red and tender.   Impression & Recommendations:  Problem # 1:  ACUTE BRONCHITIS (ICD-466.0) acute bronchitis, slowly improving. Status post Avelox see instructions The following medications were removed from the medication list:    Avelox 400 Mg Tabs (Moxifloxacin hcl) .Marland Kitchen... Take one tablet daily x10 days His updated medication list for this problem includes:    Advair Diskus 250-50 Mcg/dose Aepb (Fluticasone-salmeterol) .Marland Kitchen... 1 inh two times a day    Duoneb 0.5-2.5 (3) Mg/47ml Soln (Ipratropium-albuterol) .Marland KitchenMarland KitchenMarland KitchenMarland Kitchen 3ml neb qid  Problem # 2:  ? of GOUT, UNSPECIFIED (ICD-274.9) symptoms consistent with mild podagra At some point, it would be worth it to check a uric acid level. He will  receive some steroids for treatment of #1. That should  help his gout as well.    Complete Medication List: 1)  Adult Aspirin Ec Low Strength 81 Mg Tbec (Aspirin) .Marland Kitchen.. 1 by mouth once daily 2)  Lisinopril-hydrochlorothiazide 20-12.5 Mg Tabs (Lisinopril-hydrochlorothiazide) .... 2 by mouth once daily 3)  Lipitor 10 Mg Tabs (Atorvastatin calcium) .Marland Kitchen.. 1 by mouth at bedtime 4)  Mvi  5)  Garlic  6)  Advair Diskus 250-50 Mcg/dose Aepb (Fluticasone-salmeterol) .Marland Kitchen.. 1 inh two times a day 7)  Nebulizer Compressor Misc (Nebulizers) .... As directed 8)  Duoneb  0.5-2.5 (3) Mg/72ml Soln (Ipratropium-albuterol) .... 3ml neb qid 9)  Prednisone 20 Mg Tabs (Prednisone) .... One by mouth daily for 5 days  Patient Instructions: 1)  rest, fluids, Tylenol  2)  mucinex DM twice a day untiul better  3)  Albuterol mobilization 4  times a day as needed for cough or wheezing. For the next 3 days, take 2 nebulizations for sure. 4)  Prednisone as prescribed 5)  Call if not improving gradually, call  any time if you feel worse 6)  please scheduled a routine check up with your PCP, your sugar was slightly high last time she checked Prescriptions: LIPITOR 10 MG TABS (ATORVASTATIN CALCIUM) 1 by mouth at bedtime  #90 x 0   Entered and Authorized by:   Nolon Rod. Paz MD   Signed by:   Nolon Rod. Paz MD on 07/30/2010   Method used:   Print then Give to Patient   RxID:   [id number] PREDNISONE 20 MG TABS (PREDNISONE) one by mouth daily for 5 days  #5 x 0   Entered and Authorized by:   Nolon Rod. Paz MD   Signed by:   Nolon Rod. Paz MD on 07/30/2010   Method used:   Print then Give to Patient   RxID:   [id number]    Orders Added: 1)  Est. Patient Level III [52841]

## 2010-09-05 NOTE — Assessment & Plan Note (Signed)
Summary: cough,congestion//kp   Vital Signs:  Patient profile:   77 year old male Weight:      201.2 pounds O2 Sat:      94 % on Room air Temp:     97.9 degrees F oral Pulse rate:   63 / minute Pulse rhythm:   regular BP sitting:   120 / 60  (left arm)  Vitals Entered By: Almeta Monas CMA Duncan Dull) (July 19, 2010 4:33 PM)  O2 Flow:  Room air CC: x3days c/o cough, congestion, runny nose and scratchy throat, Cough   History of Present Illness:  Cough      This is a 77 year old man who presents with Cough.  The symptoms began 3 days ago.  The patient reports productive cough, shortness of breath, wheezing, and exertional dyspnea, but denies non-productive cough, pleuritic chest pain, fever, hemoptysis, and malaise.  Associated symtpoms include cold/URI symptoms and nasal congestion.  The patient denies the following symptoms: sore throat, chronic rhinitis, weight loss, acid reflux symptoms, and peripheral edema.  The cough is worse with activity and lying down.  Ineffective prior treatments have included OTC cough medication and other asthma medication.    Current Medications (verified): 1)  Adult Aspirin Ec Low Strength 81 Mg  Tbec (Aspirin) .Marland Kitchen.. 1 By Mouth Once Daily 2)  Lisinopril-Hydrochlorothiazide 20-12.5 Mg Tabs (Lisinopril-Hydrochlorothiazide) .... 2 By Mouth Once Daily 3)  Lipitor 10 Mg Tabs (Atorvastatin Calcium) .Marland Kitchen.. 1 By Mouth Every Day. ***labs Are Due Now*** 4)  Mvi 5)  Garlic 6)  Advair Diskus 250-50 Mcg/dose Aepb (Fluticasone-Salmeterol) .Marland Kitchen.. 1 Inh Two Times A Day 7)  Avelox 400 Mg Tabs (Moxifloxacin Hcl) .... Take One Tablet Daily X10 Days  Allergies (verified): No Known Drug Allergies  Past History:  Past Medical History: Last updated: 10/12/2007 Coronary Heart Disease - isch CMP 45%, aortic stenosis 1.0 cm211/08 Sleep Apnea aortic aneurysm, abdominal Pulmonary fibrosis Congestive heart failure pulmonary nodule Hyperlipidemia Hypertension  Past  Surgical History: Last updated: 12/01/2008 neck surgery arms surgery leg surgery  back surgery  Family History: Last updated: 12/01/2008 heart disease- mother rheumatism- mother  Social History: Last updated: 10/12/2007 Occupation:TRUCK DRIVER Married Regular exercise-yes Drug use-no  Risk Factors: Alcohol Use: 0 (02/05/2010) Exercise: no (02/05/2010)  Risk Factors: Smoking Status: current (02/05/2010) Packs/Day: 0.75 (02/05/2010) Cans of tobacco/wk: yes (02/05/2010)  Family History: Reviewed history from 12/01/2008 and no changes required. heart disease- mother rheumatism- mother  Social History: Reviewed history from 10/12/2007 and no changes required. Occupation:TRUCK DRIVER Married Regular exercise-yes Drug use-no  Review of Systems      See HPI  Physical Exam  General:  Well-developed,well-nourished,in no acute distress; alert,appropriate and cooperative throughout examination Ears:  External ear exam shows no significant lesions or deformities.  Otoscopic examination reveals clear canals, tympanic membranes are intact bilaterally without bulging, retraction, inflammation or discharge. Hearing is grossly normal bilaterally. Nose:  External nasal examination shows no deformity or inflammation. Nasal mucosa are pink and moist without lesions or exudates. Mouth:  Oral mucosa and oropharynx without lesions or exudates.  Teeth in good repair. Neck:  No deformities, masses, or tenderness noted. Lungs:  normal respiratory effort, R decreased breath sounds, R wheezes, L decreased breath sounds, and L wheezes.   improved after albu/ atrovent neb but still wheezing Heart:  normal rate.   Extremities:  No clubbing, cyanosis, edema, or deformity noted with normal full range of motion of all joints.   Skin:  Intact without suspicious lesions or rashes Cervical  Nodes:  No lymphadenopathy noted Psych:  Cognition and judgment appear intact. Alert and cooperative with  normal attention span and concentration. No apparent delusions, illusions, hallucinations   Impression & Recommendations:  Problem # 1:  ACUTE BRONCHITIS (ICD-466.0)  His updated medication list for this problem includes:    Advair Diskus 250-50 Mcg/dose Aepb (Fluticasone-salmeterol) .Marland Kitchen... 1 inh two times a day    Avelox 400 Mg Tabs (Moxifloxacin hcl) .Marland Kitchen... Take one tablet daily x10 days    Duoneb 0.5-2.5 (3) Mg/86ml Soln (Ipratropium-albuterol) .Marland KitchenMarland KitchenMarland KitchenMarland Kitchen 3ml neb qid    prednisone taper  Take antibiotics and other medications as directed. Encouraged to push clear liquids, get enough rest, and take acetaminophen as needed. To be seen in 5-7 days if no improvement, sooner if worse.  Orders: Depo- Medrol 80mg  (J1040) Nebulizer Tx (98119) Admin of Therapeutic Inj  intramuscular or subcutaneous (14782) Albuterol Sulfate Sol 1mg  unit dose (N5621) Ipratropium inhalation sol. unit dose (H0865)  Problem # 2:  HYPERLIPIDEMIA (ICD-272.4)  His updated medication list for this problem includes:    Lipitor 10 Mg Tabs (Atorvastatin calcium) .Marland Kitchen... 1 by mouth at bedtime  Labs Reviewed: SGOT: 20 (12/11/2009)   SGPT: 13 (12/11/2009)   HDL:33.50 (12/11/2009), 33.80 (07/24/2009)  LDL:138 (12/11/2009), DEL (09/13/2008)  Chol:197 (12/11/2009), 265 (07/24/2009)  Trig:126.0 (12/11/2009), 165.0 (07/24/2009)  His updated medication list for this problem includes:    Lipitor 10 Mg Tabs (Atorvastatin calcium) .Marland Kitchen... 1 by mouth at bedtime  Complete Medication List: 1)  Adult Aspirin Ec Low Strength 81 Mg Tbec (Aspirin) .Marland Kitchen.. 1 by mouth once daily 2)  Lisinopril-hydrochlorothiazide 20-12.5 Mg Tabs (Lisinopril-hydrochlorothiazide) .... 2 by mouth once daily 3)  Lipitor 10 Mg Tabs (Atorvastatin calcium) .Marland Kitchen.. 1 by mouth at bedtime 4)  Mvi  5)  Garlic  6)  Advair Diskus 250-50 Mcg/dose Aepb (Fluticasone-salmeterol) .Marland Kitchen.. 1 inh two times a day 7)  Avelox 400 Mg Tabs (Moxifloxacin hcl) .... Take one tablet daily x10  days 8)  Prednisone 10 Mg Tabs (Prednisone) .... 3 by mouth once daily for 3 days then 2 by mouth once daily for 3 days then 1 by mouth once daily for 3 days 9)  Nebulizer Compressor Misc (Nebulizers) .... As directed 10)  Duoneb 0.5-2.5 (3) Mg/28ml Soln (Ipratropium-albuterol) .... 3ml neb qid  Patient Instructions: 1)  Acute Bronchitis symptoms for less then 10 days are not  helped by antibiotics. Take over the counter cough medications. Call if no improvement in 5-7 days, sooner if increasing cough, fever, or new symptoms ( shortness of breath, chest pain) .  2)  fasting labs 3 months---272.4  lipid, hep,   790.6 bmp, hgba1c Prescriptions: PREDNISONE 10 MG TABS (PREDNISONE) 3 by mouth once daily for 3 days then 2 by mouth once daily for 3 days then 1 by mouth once daily for 3 days  #18 x 0   Entered and Authorized by:   Loreen Freud DO   Signed by:   Loreen Freud DO on 07/19/2010   Method used:   Print then Give to Patient   RxID:   [id number] AVELOX 400 MG TABS (MOXIFLOXACIN HCL) take one tablet daily x10 days  #5 x 0   Entered and Authorized by:   Loreen Freud DO   Signed by:   Loreen Freud DO on 07/19/2010   Method used:   Print then Give to Patient   RxID:   [id number] LIPITOR 10 MG TABS (ATORVASTATIN CALCIUM) 1 by mouth at bedtime  #90 x 3  Entered and Authorized by:   Loreen Freud DO   Signed by:   Loreen Freud DO on 07/19/2010   Method used:   Electronically to        [facility] # [phone]* (retail)       [address]       [place], [place]  [address]       Ph: [phone]       Fax: [phone]   RxID:   [id number] DUONEB 0.5-2.5 (3) MG/3ML SOLN (IPRATROPIUM-ALBUTEROL) 3ml NEB qid  #1 box x 3   Entered and Authorized by:   Loreen Freud DO   Signed by:   Loreen Freud DO on 07/19/2010   Method used:   Printed then faxed to ...       [facility]  [address] # [phone]* (retail)       [phone] [address]       [place], [place]  [address]       Ph: [phone]       Fax: [phone]   RxID:   [id number] NEBULIZER COMPRESSOR  MISC (NEBULIZERS) as directed  #1 x 0   Entered and Authorized by:   Loreen Freud DO   Signed by:   Loreen Freud DO on 07/19/2010   Method used:   Printed then faxed to ...       [facility]  [address] # [phone]* (retail)       [phone]       [place], [place]  [address]       Ph: [phone]       Fax: [phone]   RxID:   [id number] LIPITOR 20 MG TABS (ATORVASTATIN CALCIUM) 1 by mouth at bedtime  #90 x 3   Entered and Authorized by:   Loreen Freud DO   Signed by:   Loreen Freud DO on 07/19/2010   Method used:   Electronically to        [facility] # [phone]* (retail)       [address]       [place], [place]  [address]       Ph: [phone]       Fax: [phone]   RxID:   [id number] PREDNISONE 10 MG TABS (PREDNISONE) 3 by mouth once daily for 3 days then 2 by mouth once daily for 3 days then 1 by mouth once daily for 3 days  #18 x 0   Entered and Authorized by:   Loreen Freud DO   Signed by:   Loreen Freud DO on 07/19/2010   Method used:   Electronically to        [facility] # [phone]* (retail)       [address]       [place], [place]  [address]       Ph: [phone]       Fax: [phone]   RxID:   [id number] AVELOX 400 MG TABS (MOXIFLOXACIN HCL) take one tablet daily x10 days  #5 x 0   Entered and Authorized by:   Loreen Freud DO   Signed by:   Loreen Freud DO on 07/19/2010   Method used:  Electronically to        [facility] # [phone]* (retail)       [address]       [place], [place]  [address]       Ph: [phone]       Fax: [phone]   RxID:   [id number]    Medication Administration  Injection # 1:    Medication: Depo- Medrol 80mg     Diagnosis: ACUTE BRONCHITIS (ICD-466.0)    Route: IM    Site: RUOQ gluteus     Exp Date: 02/02/2011    Lot #: [id number]    Patient tolerated injection without complications    Given by: Almeta Monas CMA Duncan Dull) (July 19, 2010 5:17 PM)  Medication # 1:    Medication: Albuterol Sulfate Sol 1mg  unit dose    Diagnosis: ACUTE BRONCHITIS (ICD-466.0)    Dose: 2.5mg /32ml    Route: inhaled    Exp Date: 02/02/2012    Lot #: [id number]    Mfr: nephron    Patient tolerated medication without complications    Given by: Almeta Monas CMA Duncan Dull) (July 19, 2010 5:18 PM)  Medication # 2:    Medication: Ipratropium inhalation sol. unit dose    Diagnosis: ACUTE BRONCHITIS (ICD-466.0)    Dose: .5mg /2.16ml    Route: inhaled    Exp Date: 05/05/2011    Lot #: [id number]    Mfr: nephron  Orders Added: 1)  Est. Patient Level IV [33295] 2)  Depo- Medrol 80mg  [J1040] 3)  Nebulizer Tx [18841] 4)  Admin of Therapeutic Inj  intramuscular or subcutaneous [96372] 5)  Albuterol Sulfate Sol 1mg  unit dose [Y6063] 6)  Ipratropium inhalation sol. unit dose [K1601]

## 2010-09-05 NOTE — Progress Notes (Signed)
Summary: refills  Phone Note Refill Request Call back at Home Phone [phone]   Refills Requested: Medication #1:  LISINOPRIL-HYDROCHLOROTHIAZIDE 20-12.5 MG TABS 2 by mouth once daily  Medication #2:  DUONEB 0.5-2.5 (3) MG/3ML SOLN 3ml NEB qid Please fax Rx to [place] @ [phone], attn:karen............Marland KitchenFelecia Deloach CMA  August 21, 2010 4:03 PM      Prescriptions: DUONEB 0.5-2.5 (3) MG/3ML SOLN (IPRATROPIUM-ALBUTEROL) 3ml NEB qid  #1 box x 3   Entered by:   Almeta Monas CMA (AAMA)   Authorized by:   Loreen Freud DO   Signed by:   Almeta Monas CMA (AAMA) on 08/22/2010   Method used:   Printed then faxed to ...       [facility] (retail)             , [place]         Ph:        Fax:    RxID:   [id number] LISINOPRIL-HYDROCHLOROTHIAZIDE 20-12.5 MG TABS (LISINOPRIL-HYDROCHLOROTHIAZIDE) 2 by mouth once daily  #180 x 3   Entered by:   Almeta Monas CMA (AAMA)   Authorized by:   Loreen Freud DO   Signed by:   Almeta Monas CMA (AAMA) on 08/22/2010   Method used:   Printed then faxed to ...       [facility] (retail)             , [place]         Ph:        Fax:    RxID:   [id number]

## 2010-09-17 ENCOUNTER — Encounter: Payer: Self-pay | Admitting: Family Medicine

## 2010-09-17 ENCOUNTER — Ambulatory Visit (INDEPENDENT_AMBULATORY_CARE_PROVIDER_SITE_OTHER): Payer: Medicare PPO | Admitting: Family Medicine

## 2010-09-17 DIAGNOSIS — J209 Acute bronchitis, unspecified: Secondary | ICD-10-CM

## 2010-09-17 DIAGNOSIS — J449 Chronic obstructive pulmonary disease, unspecified: Secondary | ICD-10-CM

## 2010-09-17 DIAGNOSIS — J4489 Other specified chronic obstructive pulmonary disease: Secondary | ICD-10-CM

## 2010-09-25 ENCOUNTER — Ambulatory Visit (INDEPENDENT_AMBULATORY_CARE_PROVIDER_SITE_OTHER): Payer: Medicare PPO | Admitting: Pulmonary Disease

## 2010-09-25 ENCOUNTER — Ambulatory Visit (INDEPENDENT_AMBULATORY_CARE_PROVIDER_SITE_OTHER)
Admission: RE | Admit: 2010-09-25 | Discharge: 2010-09-25 | Disposition: A | Discharge status: Home / Self Care | Payer: Medicare PPO | Source: Ambulatory Visit | Attending: Pulmonary Disease | Admitting: Pulmonary Disease

## 2010-09-25 ENCOUNTER — Encounter: Payer: Self-pay | Admitting: Pulmonary Disease

## 2010-09-25 ENCOUNTER — Other Ambulatory Visit: Payer: Self-pay | Admitting: Pulmonary Disease

## 2010-09-25 DIAGNOSIS — F17200 Nicotine dependence, unspecified, uncomplicated: Secondary | ICD-10-CM

## 2010-09-25 DIAGNOSIS — J209 Acute bronchitis, unspecified: Secondary | ICD-10-CM

## 2010-09-25 DIAGNOSIS — J84111 Idiopathic interstitial pneumonia, not otherwise specified: Secondary | ICD-10-CM

## 2010-09-25 DIAGNOSIS — J449 Chronic obstructive pulmonary disease, unspecified: Secondary | ICD-10-CM

## 2010-09-25 NOTE — Assessment & Plan Note (Signed)
Summary: cough,/cbs   Vital Signs:  Patient profile:   77 year old male Weight:      203.4 pounds O2 Sat:      93 % on Room air Temp:     98.2 degrees F oral Pulse rate:   59 / minute Pulse rhythm:   regular BP sitting:   118 / 68  (right arm) Cuff size:   large  Vitals Entered By: Almeta Monas CMA Duncan Dull) (September 17, 2010 1:17 PM)  O2 Flow:  Room air CC: x2 weeks c/o cough with wheezing, Cough   History of Present Illness:  Cough      This is a 77 year old man who presents with Cough.  The symptoms began 1 week ago.  The patient reports productive cough and wheezing, but denies non-productive cough, pleuritic chest pain, shortness of breath, exertional dyspnea, fever, hemoptysis, and malaise.  Associated symtpoms include cold/URI symptoms and nasal congestion.  The patient denies the following symptoms: sore throat, chronic rhinitis, weight loss, acid reflux symptoms, and peripheral edema.  The cough is worse with activity and lying down.  Ineffective prior treatments have included OTC cough medication and other asthma medication.  Risk factors include history of asthma.    Current Medications (verified): 1)  Adult Aspirin Ec Low Strength 81 Mg  Tbec (Aspirin) .Marland Kitchen.. 1 By Mouth Once Daily 2)  Lisinopril-Hydrochlorothiazide 20-12.5 Mg Tabs (Lisinopril-Hydrochlorothiazide) .... 2 By Mouth Once Daily 3)  Lipitor 10 Mg Tabs (Atorvastatin Calcium) .Marland Kitchen.. 1 By Mouth At Bedtime 4)  Mvi 5)  Garlic 6)  Advair Diskus 250-50 Mcg/dose Aepb (Fluticasone-Salmeterol) .Marland Kitchen.. 1 Inh Two Times A Day 7)  Nebulizer Compressor  Misc (Nebulizers) .... As Directed 8)  Duoneb 0.5-2.5 (3) Mg/1ml Soln (Ipratropium-Albuterol) .... 3ml Neb Qid 9)  Prednisone 20 Mg Tabs (Prednisone) .... One By Mouth Daily For 5 Days 10)  Zostavax 16109 Unt/0.78ml Solr (Zoster Vaccine Live) .Marland Kitchen.. 1ml  Im X1 11)  Zithromax Z-Pak 250 Mg Tabs (Azithromycin) .... As Directed  Allergies (verified): No Known Drug Allergies  Past  History:  Past Medical History: Last updated: 10/12/2007 Coronary Heart Disease - isch CMP 45%, aortic stenosis 1.0 cm211/08 Sleep Apnea aortic aneurysm, abdominal Pulmonary fibrosis Congestive heart failure pulmonary nodule Hyperlipidemia Hypertension  Past Surgical History: Last updated: 12/01/2008 neck surgery arms surgery leg surgery  back surgery  Family History: Last updated: 12/01/2008 heart disease- mother rheumatism- mother  Social History: Last updated: 10/12/2007 Occupation:TRUCK DRIVER Married Regular exercise-yes Drug use-no  Risk Factors: Alcohol Use: 0 (02/05/2010) Exercise: no (02/05/2010)  Risk Factors: Smoking Status: current (02/05/2010) Packs/Day: 0.75 (02/05/2010) Cans of tobacco/wk: yes (02/05/2010)  Family History: Reviewed history from 12/01/2008 and no changes required. heart disease- mother rheumatism- mother  Social History: Reviewed history from 10/12/2007 and no changes required. Occupation:TRUCK DRIVER Married Regular exercise-yes Drug use-no  Review of Systems      See HPI  Physical Exam  General:  Well-developed,well-nourished,in no acute distress; alert,appropriate and cooperative throughout examination Nose:  External nasal examination shows no deformity or inflammation. Nasal mucosa are pink and moist without lesions or exudates. Mouth:  Oral mucosa and oropharynx without lesions or exudates.  Teeth in good repair. Neck:  No deformities, masses, or tenderness noted. Lungs:  normal respiratory effort, no intercostal retractions, R wheezes, and L wheezes.   Extremities:  No clubbing, cyanosis, edema, or deformity noted with normal full range of motion of all joints.   Cervical Nodes:  No lymphadenopathy noted Psych:  Cognition  and judgment appear intact. Alert and cooperative with normal attention span and concentration. No apparent delusions, illusions, hallucinations   Impression & Recommendations:  Problem # 1:   ACUTE BRONCHITIS (ICD-466.0)  His updated medication list for this problem includes:    Advair Diskus 250-50 Mcg/dose Aepb (Fluticasone-salmeterol) .Marland Kitchen... 1 inh two times a day    Duoneb 0.5-2.5 (3) Mg/7ml Soln (Ipratropium-albuterol) .Marland KitchenMarland KitchenMarland KitchenMarland Kitchen 3ml neb qid    Zithromax Z-pak 250 Mg Tabs (Azithromycin) .Marland Kitchen... As directed  Take antibiotics and other medications as directed. Encouraged to push clear liquids, get enough rest, and take acetaminophen as needed. To be seen in 5-7 days if no improvement, sooner if worse.  Complete Medication List: 1)  Adult Aspirin Ec Low Strength 81 Mg Tbec (Aspirin) .Marland Kitchen.. 1 by mouth once daily 2)  Lisinopril-hydrochlorothiazide 20-12.5 Mg Tabs (Lisinopril-hydrochlorothiazide) .... 2 by mouth once daily 3)  Lipitor 10 Mg Tabs (Atorvastatin calcium) .Marland Kitchen.. 1 by mouth at bedtime 4)  Mvi  5)  Garlic  6)  Advair Diskus 250-50 Mcg/dose Aepb (Fluticasone-salmeterol) .Marland Kitchen.. 1 inh two times a day 7)  Nebulizer Compressor Misc (Nebulizers) .... As directed 8)  Duoneb 0.5-2.5 (3) Mg/33ml Soln (Ipratropium-albuterol) .... 3ml neb qid 9)  Prednisone 20 Mg Tabs (Prednisone) .... One by mouth daily for 5 days 10)  Zostavax 06237 Unt/0.75ml Solr (Zoster vaccine live) .Marland Kitchen.. 1ml  im x1 11)  Zithromax Z-pak 250 Mg Tabs (Azithromycin) .... As directed  Other Orders: Albuterol Sulfate Sol 1mg  unit dose (S2831) Ipratropium inhalation sol. unit dose (D1761) Nebulizer Tx (60737) Pulmonary Referral (Pulmonary) Prescriptions: ZITHROMAX Z-PAK 250 MG TABS (AZITHROMYCIN) as directed  #1 x 0   Entered and Authorized by:   Loreen Freud DO   Signed by:   Loreen Freud DO on 09/17/2010   Method used:   Faxed to ...       [facility]  [address]  [phone] (retail)       [address]       [place], [place]  [address]       Ph: [phone] or [phone]       Fax: [phone]   RxID:   [id number] PREDNISONE 20 MG TABS (PREDNISONE) one by mouth daily for 5 days  #5 x 0   Entered and Authorized by:   Loreen Freud DO   Signed by:   Loreen Freud DO on 09/17/2010   Method used:   Faxed to ...       [facility]  [address]  [phone] (retail)       [address]       [place], [place]  [address]       Ph: [phone] or [phone]       Fax: [phone]   RxID:   [id number] ZOSTAVAX 19400 UNT/0.65ML SOLR (ZOSTER VACCINE LIVE) 1ml  IM x1  #1 x 0   Entered and Authorized by:   Loreen Freud DO   Signed by:   Loreen Freud DO on 09/17/2010   Method used:   Print then Give to Patient   RxID:   [id number] LISINOPRIL-HYDROCHLOROTHIAZIDE 20-12.5 MG TABS (LISINOPRIL-HYDROCHLOROTHIAZIDE) 2 by mouth once daily  #180 x 3   Entered by:   Almeta Monas CMA (AAMA)   Authorized by:   Loreen Freud DO   Signed by:   Almeta Monas CMA (AAMA) on 09/17/2010   Method used:   Print then Give to Patient   RxID:   [id number]    Medication Administration  Medication #  1:    Medication: Albuterol Sulfate Sol 1mg  unit dose    Diagnosis: COPD (ICD-496)    Dose: 2.5mg /31ml    Route: inhaled    Exp Date: 02/02/2012    Lot #: [id number]    Mfr: nephron    Patient tolerated medication without complications    Given by: Almeta Monas CMA Duncan Dull) (September 17, 2010 2:17 PM)  Medication # 2:    Medication: Ipratropium inhalation sol. unit dose    Diagnosis: COPD (ICD-496)    Dose: 0.5mg /2.60ml    Route: inhaled    Exp Date: 05/05/2011    Lot #: [id number]    Mfr: nephron    Patient tolerated medication without complications    Given by: Almeta Monas CMA Duncan Dull) (September 17, 2010 2:19 PM)  Orders Added: 1)  Albuterol Sulfate Sol 1mg  unit dose [W2956] 2)  Ipratropium inhalation sol. unit dose [J7644] 3)  Nebulizer Tx [94640] 4)  Pulmonary Referral [Pulmonary] 5)  Est. Patient Level IV [21308]

## 2010-10-01 ENCOUNTER — Ambulatory Visit: Payer: Medicare PPO | Admitting: Pulmonary Disease

## 2010-10-01 ENCOUNTER — Ambulatory Visit (INDEPENDENT_AMBULATORY_CARE_PROVIDER_SITE_OTHER): Payer: Medicare PPO | Admitting: Pulmonary Disease

## 2010-10-01 ENCOUNTER — Encounter: Payer: Self-pay | Admitting: Pulmonary Disease

## 2010-10-01 DIAGNOSIS — I10 Essential (primary) hypertension: Secondary | ICD-10-CM

## 2010-10-01 DIAGNOSIS — J209 Acute bronchitis, unspecified: Secondary | ICD-10-CM

## 2010-10-01 DIAGNOSIS — F17200 Nicotine dependence, unspecified, uncomplicated: Secondary | ICD-10-CM

## 2010-10-01 NOTE — Assessment & Plan Note (Signed)
Summary: COPD- SOB- [address] //[place]   Primary Provider/Referring Provider:  Laury Axon  CC:  Pt last seen 2010. Pt c/o wheezing, increased sob, and cough w/ clear phlem x 1-2 months.  History of Present Illness: 77/M, smoker with interstitial prominence dating back to 1998, pulm nodules & mediastinal LNs stable since 2004. He has periodic flares of 'asthmatic bronchitis' improving with several rounds of antibiotics & steroids Mild OSA on PSG with PLMs 58/h. Sinus bradycardia with extensive evaluation by Cards. Pfts last 2009 have surprisingly not shown any obstruction, but mild restriction with preserved DLCO. ON Lisinopril for years   September 25, 2010 9:20 AM  -last seen in 2010 WOrse x 3 months, increased wheezing & dyspnea Took Abx,clear phlegm now, used prednisone 20 mg x 5ds Stopped advair, , on duonebs 4 times /day Working outside - bobcat, Secondary school teacher, sanded wood CXR >> interstitial prominence unchanged, no consolidation   Current Medications (verified): 1)  Adult Aspirin Ec Low Strength 81 Mg  Tbec (Aspirin) .Marland Kitchen.. 1 By Mouth Once Daily 2)  Lisinopril-Hydrochlorothiazide 20-12.5 Mg Tabs (Lisinopril-Hydrochlorothiazide) .... 2 By Mouth Once Daily 3)  Lipitor 10 Mg Tabs (Atorvastatin Calcium) .Marland Kitchen.. 1 By Mouth At Bedtime 4)  Mvi 5)  Garlic 6)  Advair Diskus 250-50 Mcg/dose Aepb (Fluticasone-Salmeterol) .Marland Kitchen.. 1 Inh Two Times A Day As Needed 7)  Nebulizer Compressor  Misc (Nebulizers) .... As Directed 8)  Duoneb 0.5-2.5 (3) Mg/39ml Soln (Ipratropium-Albuterol) .... 3ml Neb Qid 9)  Zostavax 04540 Unt/0.51ml Solr (Zoster Vaccine Live) .Marland Kitchen.. 1ml  Im X1 (Not Taken Yet) 10)  Mucinex 600 Mg Xr12h-Tab (Guaifenesin) .... Once Daily  Allergies (verified): No Known Drug Allergies  Past History:  Past Medical History: Last updated: 10/12/2007 Coronary Heart Disease - isch CMP 45%, aortic stenosis 1.0 cm211/08 Sleep Apnea aortic aneurysm, abdominal Pulmonary fibrosis Congestive heart  failure pulmonary nodule Hyperlipidemia Hypertension  Social History: Last updated: 10/12/2007 Occupation:TRUCK DRIVER Married Regular exercise-yes Drug use-no  Past Pulmonary History:  Pulmonary History: Fibrosis seems to be non progressive based on symptoms & PFTs- 1/09 FVC 60%, ratio 81 - no airway obstructoin! No active alveolitis Doubt IPF , could be post inflammatory or related to old granbulomatous dz.  Review of Systems       The patient complains of dyspnea on exertion.  The patient denies anorexia, fever, weight loss, weight gain, vision loss, decreased hearing, hoarseness, chest pain, syncope, peripheral edema, prolonged cough, headaches, hemoptysis, abdominal pain, melena, hematochezia, severe indigestion/heartburn, hematuria, muscle weakness, suspicious skin lesions, transient blindness, difficulty walking, depression, unusual weight change, abnormal bleeding, enlarged lymph nodes, and angioedema.    Vital Signs:  Patient profile:   77 year old male Height:      68 inches Weight:      204.38 pounds BMI:     31.19 O2 Sat:      95 % on Room air Pulse rate:   55 / minute BP sitting:   158 / 78  (right arm) Cuff size:   large  Vitals Entered By: Carver Fila (September 25, 2010 9:01 AM)  O2 Flow:  Room air CC: Pt last seen 2010. Pt c/o wheezing, increased sob, cough w/ clear phlem x 1-2 months Comments meds and allergies updated Phone number updated Carver Fila  September 25, 2010 9:01 AM    Physical Exam  Additional Exam:  Gen. Pleasant, well-nourished, in no distress ENT - no lesions, no post nasal drip Neck: No JVD, no thyromegaly, no carotid bruits Lungs: no use of accessory  muscles, no dullness to percussion, Bl scattered rhonchi  Cardiovascular: Rhythm regular, heart sounds  normal, no murmurs or gallops, no peripheral edema Musculoskeletal: No deformities, no cyanosis or clubbing , mud stained nails     CXR  Procedure date:   09/25/2010  Findings:      IMPRESSION: Diffuse interstitial changes in the lungs, probably representing fibrosis.  No acute changes since previous study.  Impression & Recommendations:  Problem # 1:  ASTHMATIC BRONCHITIS, ACUTE (ICD-466.0)  He seems to be having another attack - that is hard to resolve Trial of symbicort 160 2 puffs two times a day - STOP advair Prednisone course as directed - start at 40 mg Stay on duonebs upto 4 times/ day Trial of ZYRTEC for allergies  The following medications were removed from the medication list:    Advair Diskus 250-50 Mcg/dose Aepb (Fluticasone-salmeterol) .Marland Kitchen... 1 inh two times a day as needed His updated medication list for this problem includes:    Duoneb 0.5-2.5 (3) Mg/21ml Soln (Ipratropium-albuterol) .Marland KitchenMarland KitchenMarland KitchenMarland Kitchen 3ml neb qid    Mucinex 600 Mg Xr12h-tab (Guaifenesin) ..... Once daily    Symbicort 160-4.5 Mcg/act Aero (Budesonide-formoterol fumarate) .Marland Kitchen... 2 puffs bid  Orders: Est. Patient Level IV (04540)  Problem # 2:  TOBACCO ABUSE (ICD-305.1)  He has expressed a desire to make a quit attempt Will assist with chantix once current exacerbation resolved  Orders: Est. Patient Level IV (98119) Prescription Created Electronically [phone])  Problem # 3:  PULMONARY IDIOPATHIC FIBROSING ALVEOLITIS-IPF (ICD-516.3)  Unclear cause of interstitial prominence dating back severeal years but non progressive - doubt IPF here, does not appear to be in overt CHF Sarcoidosis would be a great unifying diagnosis.  Orders: Est. Patient Level IV (95621) Prescription Created Electronically [phone])  Medications Added to Medication List This Visit: 1)  Advair Diskus 250-50 Mcg/dose Aepb (Fluticasone-salmeterol) .Marland Kitchen.. 1 inh two times a day as needed 2)  Zostavax 78469 Unt/0.1ml Solr (Zoster vaccine live) .Marland Kitchen.. 1ml  im x1 (not taken yet) 3)  Mucinex 600 Mg Xr12h-tab (Guaifenesin) .... Once daily 4)  Symbicort 160-4.5 Mcg/act Aero (Budesonide-formoterol  fumarate) .... 2 puffs bid 5)  Prednisone 10 Mg Tabs (Prednisone) .... Take 4 tabs  daily with food x 4 days, then 3 tabs daily x 4 days, then 2 tabs daily x 4 days, then 1 tab daily x4 days then stop. #40  Other Orders: T-2 View CXR ([id number])  Patient Instructions: 1)  Copy sent to: Dr Laury Axon 2)  Please schedule a follow-up appointment in 1 week. 3)  A chest x-ray has been recommended.  Your imaging study may require preauthorization.  4)  Trial of symbicort 160 2 puffs two times a day - STOP advair 5)  Prednisone course as directed - start at 40 mg 6)  Stay on duonebs upto 4 times/ day 7)  Trial of ZYRTEC for allergies Prescriptions: PREDNISONE 10 MG TABS (PREDNISONE) Take 4 tabs  daily with food x 4 days, then 3 tabs daily x 4 days, then 2 tabs daily x 4 days, then 1 tab daily x4 days then stop. #40  #40 x 0   Entered and Authorized by:   Comer Locket Vassie Loll MD   Signed by:   Comer Locket Vassie Loll MD on 09/25/2010   Method used:   Print then Give to Patient   RxID:   [id number]

## 2010-10-03 ENCOUNTER — Ambulatory Visit: Payer: Medicare PPO | Admitting: Pulmonary Disease

## 2010-10-03 ENCOUNTER — Telehealth (INDEPENDENT_AMBULATORY_CARE_PROVIDER_SITE_OTHER): Payer: Self-pay | Admitting: *Deleted
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
## 2010-10-10 NOTE — Progress Notes (Signed)
Summary: needs prescription called into [facility] [place]  Phone Note Call from Patient   Caller: spouse/Rosalie Call For: alva Summary of Call: Patients spouse phoned stated that patient was seen by Dr Vassie Loll in [facility] on Tuesday and he had a prescription for Chantix starter [facility] in [place] on [address] but [facility] is telling the patient that they never received the [facility] prescription. They would like this called in to [facility] on [address] in [place]. She can be reached at [phone] Initial call taken by: Vedia Coffer,  October 03, 2010 10:38 AM  Follow-up for Phone Call        Spoke with pt's spouse and notified that rx was sent to [facility]  [address] in [place]. Follow-up by: Vernie Murders,  October 03, 2010 11:49 AM    New/Updated Medications: CHANTIX STARTING MONTH PAK 0.5 MG X 11 & 1 MG X 42 TABS (VARENICLINE TARTRATE) take as directed Prescriptions: CHANTIX STARTING MONTH PAK 0.5 MG X 11 & 1 MG X 42 TABS (VARENICLINE TARTRATE) take as directed  #1 x 0   Entered by:   Vernie Murders   Authorized by:   Comer Locket. Vassie Loll MD   Signed by:   Vernie Murders on 10/03/2010   Method used:   Electronically to        [facility] # [id number]* (retail)       [address]       [place], [place]  [address]       Ph: [phone]       Fax: [phone]   RxID:   [id number]

## 2010-11-14 LAB — BASIC METABOLIC PANEL
Chloride: 102 mEq/L (ref 96–112)
GFR calc non Af Amer: 59 mL/min — ABNORMAL LOW (ref >60)
Potassium: 3.7 mEq/L (ref 3.5–5.1)
Sodium: 140 mEq/L (ref 135–145)

## 2010-11-14 LAB — DIFFERENTIAL
Eosinophils Absolute: 0.6 10*3/uL (ref 0.0–0.7)
Eosinophils Relative: 8 % — ABNORMAL HIGH (ref 0–5)
Lymphocytes Relative: 17 % (ref 12–46)
Lymphs Abs: 1.3 10*3/uL (ref 0.7–4.0)
Monocytes Absolute: 0.7 10*3/uL (ref 0.1–1.0)
Monocytes Relative: 9 % (ref 3–12)

## 2010-11-14 LAB — CBC
HCT: 42.5 % (ref 39.0–52.0)
Hemoglobin: 13.9 g/dL (ref 13.0–17.0)
MCV: 85.5 fL (ref 78.0–100.0)
WBC: 7.6 10*3/uL (ref 4.0–10.5)

## 2010-12-17 NOTE — Discharge Summary (Signed)
Joe Jackson, Joe Jackson                 ACCOUNT NO.:  [id number]   MEDICAL RECORD NO.:  [id number]          PATIENT TYPE:  INP   LOCATION:  1507                         FACILITY:  [facility]   PHYSICIAN:  Rosalyn Gess. Norins, MD  DATE OF BIRTH:  Nov 02, 1929   DATE OF ADMISSION:  09/27/2007  DATE OF DISCHARGE:  09/29/2007                               DISCHARGE SUMMARY   ADMITTING DIAGNOSIS:  Asthmatic bronchitis with exacerbation of PIF.   DISCHARGE DIAGNOSIS:  Asthmatic bronchitis with exacerbation of PIF.   HISTORY OF PRESENT ILLNESS:  Mr. Joe Jackson is a 77 year old gentleman  followed by Dr. Randa Lynn, Dr. Vassie Loll, Dr. Riley Kill, Dr. Graciela Husbands, and Dr. Durwin Nora.   The patient has a history of pulmonary interstitial fibrosis/alveolitis.  Several days prior to admission, he developed increasing shortness of  breath and wheezing with progressive cough.  He was seen at Urgent Care  in [facility] where he was told he had pulmonary edema and was  directed to the emergency department.  He proceeded to [facility] for  evaluation.   On presentation to [facility], the patient was short of  breath, ashen, and wheezing.  His lab work evaluation revealed a normal  white count.  Chest x-ray revealed chronic interstitial changes but no  acute disease and no pulmonary edema.  BNP was 203.  The patient was  given IV Solu-Medrol and Xopenex handheld nebulizer treatment with  significant improvement in his respiratory function.  With the patient's  underlying chronic lung disease and an asthmatic bronchitic  exacerbation, he was admitted for IV steroids.   Please see the H&P for past medical history, family history, social  history and physical exam at admission.   HOSPITAL COURSE:  The patient was started on Solu-Medrol 80 mg IV q.12h.  x2 doses.  He also was started on azithromycin.  On this regimen, the  patient did very well.  His respiratory function was back to normal.  He  had no significant shortness of  breath or wheezing.  He had no further  temperature.  His cough was improved.  With the patient showing  significant improvement with good O2 saturations, he thought to be  stable and ready for discharge to home to continue a prednisone burst  and taper.   DISCHARGE EXAMINATION:  Temperature 98.4, blood pressure 154/75, heart  rate 70, respirations 20, O2 saturations 94% on room air ranging up to  97% on room air.  GENERAL APPEARANCE:  This a pleasant elderly gentleman who is up bathing  in no distress.  CHEST:  Patient is moving air well.  There are no rales, wheezes or  rhonchi.  CARDIOVASCULAR EXAMINATION:  Was unremarkable with a regular rate and  rhythm.   FINAL LABORATORY:  The patient had no additional labs since admission.  Of note, he had a D-dimer that returned at 1.68; however, the patient  had a normal chest x-ray with no volume loss, no shortness of breath, no  wheezing, no chest pain, and therefore further evaluation was not  conducted.  Final imaging study as noted.   DISCHARGE MEDICATIONS:  The patient will be sent home on prednisone  burst and taper at 40 mg of prednisone p.o. daily x1, then 30 mg p.o.  b.i.d. x2 days, then 40 mg daily x3, 20 mg daily x3, 10 mg daily until  seen by Dr. Laury Axon for follow-up.  He will continue all of his other  medications.  He will complete azithromycin at 250 mg daily for 3  additional days.   The patient is instructed if he has any recurrent chest pain, chest  discomfort, shortness of breath, wheezing or other symptoms he is to  return for further evaluation.   The patient to see Dr. Laury Axon in follow-up in 7-10 days.  He is to see  Dr. Vassie Loll as scheduled.  He sees his other physicians as scheduled.   The patient's condition at time of discharge dictation is stable and  improved.      Rosalyn Gess Norins, MD  Electronically Signed     MEN/MEDQ  D:  09/29/2007  T:  09/29/2007  Job:  [id number]

## 2010-12-17 NOTE — Assessment & Plan Note (Signed)
[facility]                             PULMONARY OFFICE NOTE   NAME:Kaas, MAINOR HELLMANN                        MRN:          [id number]  DATE:07/06/2007                            DOB:          October 03, 1929    Mr. Eskew is a 77 year old smoker who is referred for an abnormal CT  scan. He has seen Dr. Sherene Sires in the past and his serial x-rays dating back  as far as June 1998, show interstitial prominence. Nodular opacities  have been noted in his lung going back to 2004. A CT chest from June 21, 2007, showed stable appearance of these small lung nodules measuring  5-mm along the minor fissure and in the left lower lobe. Mediastinal  lymph nodes were noted, which were stable. Most prominent was a  subcarinal 1.6 x 1.6 cm and a para tracheal measuring 1.4 x 1.9 cm.  Pleural calcification was noted. A diffuse homogenous ground glass  attenuation was noted. Also, slightly degree compared to his CT scan  from December 2007. Hence, we were consulted.   Mr. Sar denies any worsening of his baseline dyspnea. He is still  quite active out in the yard, sometimes he gets dyspneic on walking back  from the barn. He is still able to dance once a week.   PAST MEDICAL HISTORY:  1. Hypertension.  2. Ischemic cardiomyopathy with an ejection fraction of 40-45%.  3. Aortic stenosis of about 1.6 cm square.  4. Abdominal aortic aneurysm being followed by Dr. Edilia Bo (every      year).  5. Mild obstructive sleep apnea based on a polysomnogram in May 2008,      showing an AHI of 9 per hour and REM AHI of 15.9 per hour (60.5      minutes of REM sleep) with the lowest desaturation of 78% during      REM. Bradycardia with a heart rate of 33 beats per minute during      non-REM sleep. Limb movement index of 58.6 events per hour.   ALLERGIES:  None.   CURRENT MEDICATIONS:  1. Diovan 160/2.5 mg daily.  2. Aspirin 81 mg daily.  3. Multivitamin.  4. Garlic.  5. Glucosamine.  6. Flax seed oil.  7. B12.   SOCIAL HISTORY:  Smokes about a pack per day. He was able to quit with  Chantix about a year ago, but this gave him a weird feeling and hence he  stopped. He is married and lives with his wife. He is a recovered  alcoholic. He has worked multiple jobs Geologist, engineering, plumbing.   FAMILY HISTORY:  Family history of heart disease in mother.   REVIEW OF SYSTEMS:  Dyspnea with activity. Denies wheezing or chest  colds. Reports irregular heart beat, swelling of his hands and feet and  joint stiffness.   PHYSICAL EXAMINATION:  Height 5 feet, 8 inches. Weight is 211 pounds.  Temperature 98.1, heart rate 38 per minute. Blood pressure 110/58.  Oxygen saturation is 95% on room air.  HEENT: Class 3 airway.  CARDIOVASCULAR: S1, S2, brady. Ejection systolic  murmur 3/6 at the base.  CHEST: Crackles at the right base.  ABDOMEN: Soft and nontender.  EXTREMITIES: No edema.   PFTs in February 2006, showed FEV1/FEC ratio of 79. FEV1 of 77%. Lung  volumes were decreased with an FEC of 65%, TLC of 70%. Diffusion  capacity was preserved at 116%.   IMPRESSION:  1. Mild extraparenchymal restriction with some ground glass and      interstitial prominence noted on his serial imaging studies dating      back at least to 2004.  2. Surprisingly, no significant airway obstruction in this heavy      smoker.  3. Stable pulmonary nodule and mediastinal lymph nodes.  4. Mild obstructive sleep apnea with increased limb movements.   RECOMMENDATIONS:  1. I will repeat his pulmonary function tests to look for worsening      restriction and followup diffusion capacity. The interstitial      infiltrates date back at least four years and he does not seem to      be symptomatically worse.  2. Clearly smoking cessation is paramount here and that is emphasized.      He has quit earlier with Chantix, but does not want to try this      again. I suggested Nicotrol inhalers. He will  pursue this with Dr.      Laury Axon again.  3. Bradycardia has been observed and a pacemaker has been considered      by Dr. Graciela Husbands. I do not see any pulmonary contraindication to this.  4. A followup CT scan will be scheduled in six months, but pulmonary      nodules and mediastinal lymphadenopathy do appear to be stable from      at least December 2007.     Oretha Milch, MD  Electronically Signed    RVA/MedQ  DD: 07/06/2007  DT: 07/06/2007  Job #: [id number]   cc:   Lelon Perla, DO  Arturo Morton Riley Kill, MD, [facility]

## 2010-12-17 NOTE — Procedures (Signed)
DUPLEX ULTRASOUND OF ABDOMINAL AORTA   INDICATION:  Follow up of known AAA.   HISTORY:  Diabetes:  No.  Cardiac:  MI, CHF.  Hypertension:  Yes.  Smoking:  Yes, one pack per day.  Connective Tissue Disorder:  Family History:  No.  Previous Surgery:   DUPLEX EXAM:         AP (cm)                   TRANSVERSE (cm)  Proximal             2.05 cm                   2.0 cm  Mid                  2.81 cm                   2.81 cm  Distal               2.02 cm                   2.23 cm  Right Iliac          1.43 cm                   1.43 cm  Left Iliac           1.32 cm                   1.43 cm   PREVIOUS:  Date: 02/10/07  AP:  2.6  TRANSVERSE:  2.5   IMPRESSION:  1. Technically difficult study due to body habitus and bowel gas.  2. Essentially stable abdominal aortic aneurysm with maximum diameter      measuring 2.81 cm (anteroposterior) X 2.81 cm.  3. Ectatic common iliac arteries bilaterally.   ___________________________________________  Di Kindle. Edilia Bo, M.D.   PB/MEDQ  D:  02/15/2008  T:  02/15/2008  Job:  [id number]

## 2010-12-17 NOTE — Procedures (Signed)
Joe Jackson, ROUPP NO.:  [id number]   MEDICAL RECORD NO.:  [id number]          PATIENT TYPE:  OUT   LOCATION:  SLEEP CENTER                 FACILITY:  [facility]   PHYSICIAN:  Barbaraann Share, MD,FCCPDATE OF BIRTH:  07/14[birth year]1932   DATE OF STUDY:  12/24/2006                            NOCTURNAL POLYSOMNOGRAM   REFERRING PHYSICIAN:   INDICATION FOR STUDY:  Hypersomnia with sleep apnea.   EPWORTH SLEEPINESS SCORE:  19.   SLEEP ARCHITECTURE:  The patient had a total sleep time of 381 minutes  with decreased REM and never achieved slow-wave sleep.  Sleep onset  latency was normal at 8 minutes, and REM onset was prolonged at 152  minutes.  Sleep efficiency was only mildly reduced at 89%.   RESPIRATORY DATA:  The patient was found to have 19 obstructive  hypopneas and 38 obstructive apneas for an apnea/hypopnea index of 9  events per hour.  The events were clearly worse during the supine  position, and there was loud to very loud snoring noted throughout.  The  patient did not meet split-night protocol secondary to most of his  events occurring not until after 2 a.m.   OXYGEN DATA:  There was O2 desaturation as low as 78% with the patient's  obstructive events.   CARDIAC DATA:  Frequent PVCs were noted throughout with an occasional  couplet and triplet.   MOVEMENT/PARASOMNIA:  The patient was found to have 372 leg jerks with 3  per hour resulting in arousal or awakening.   IMPRESSION/RECOMMENDATION:  1. Mild obstructive sleep apnea/hypopnea syndrome with an      apnea/hypopnea index of 9 events per hour and O2 desaturation as      low as 78%.  The patient did not meet split-night protocol      secondary to the majority of his events occurring after 2 a.m.      Treatment for this degree of sleep apnea can include weight loss      alone if applicable, upper airway surgery, oral appliance, and also      continuous positive airway pressure.  2. Frequent premature  ventricular contractions with occasional      couplets and triplets noted throughout.  3. Very large numbers of leg jerks with significant sleep disruption.      The number of leg jerks was way out of proportion to the amount of      his obstructive sleep apnea.  Clinical correlation is suggested to      see if the patient      has a history consistent with a primary movement disorder of sleep,      or possibly a chronic pain syndrome of some type.      Barbaraann Share, MD,FCCP  Diplomate, American Board of Sleep  Medicine  Electronically Signed     KMC/MEDQ  D:  01/05/2007 16:19:27  T:  01/05/2007 20:04:09  Job:  [id number]

## 2010-12-17 NOTE — Assessment & Plan Note (Signed)
[facility]                            CARDIOLOGY OFFICE NOTE   NAME:Joe Jackson                        MRN:          [id number]  DATE:06/04/2007                            DOB:          08-29-1929    Joe Jackson is in for followup.  He is stable.  He was in the emergency  room at [facility].  He really was asymptomatic from a cardiac  standpoint, but it sounds like he had a lot ectopy.  Today, he has  bigeminal rhythm.  He generally is feeling well.  He does not have any  significant chest pain.  He has, unfortunately, continued to smoke, and  his wife and I had a long talk with him about this.   He has also had an abdominal aneurysm followup, and it is stable.   CURRENT MEDICATIONS:  Include:  1. Diovan 160/25 mg daily.  2. Aspirin 81 mg daily.  3. Garlic 1200 mg daily.  4. Flax seed oil daily.  5. B-12 daily.   PHYSICAL EXAMINATION:  Blood pressure is 138/58.  Pulse is 72.  LUNG FIELDS:  Clear with decreased breath sounds.  CARDIAC:  Rhythm was regular with a 3/6 systolic ejection murmur.  EKG:  Reveals normal sinus rhythm.  There is bigeminal PVCs.  CT scan done in December of 2007 suggested a small pulmonary nodule by  Dr. Laury Axon.   At the present time, the patient is stable.  I have asked the patient to  contact Dr. Laury Axon with regard to followup of the CT scan.  We have also  called their office, and forwarded the information to Dr. Ernst Spell nurse  for followup.  We will get a 2-D echo on this gentleman, as well as a  Holter monitor.  I will see him back in followup in 2 months to go over  these.     Joe Jackson. Riley Kill, MD, [facility]  Electronically Signed    TDS/MedQ  DD: 06/04/2007  DT: 06/05/2007  Job #: [id number]   cc:   Lelon Perla, DO

## 2010-12-17 NOTE — Assessment & Plan Note (Signed)
OFFICE VISIT   LASALLE, ABEE  DOB:  07-Apr-1930                                       04/22/2010  ZOXWR#:[id number]   The patient returns 1 week post laser ablation of his left great  saphenous vein for painful varicosities, venous hypertension and chronic  edema in the left leg.  He states that the tightness and swelling below  the knees have already improved following his procedure 1 week ago.  He  has been wearing his long leg elastic compression stockings on a daily  basis and elevating the legs.  He had some mild to moderate tenderness  from the saphenofemoral junction to the mid thigh where the closure was  performed.  He states that is improving.  He had no chest pain, dyspnea  on exertion, or hemoptysis although he has had a recent upper  respiratory infection which is resolving.   CHRONIC MEDICAL PROBLEMS:  1. Coronary artery disease.  2. Small abdominal aortic aneurysm.  3. Hypertension.  4. Hyperlipidemia.  5. COPD.   SOCIAL HISTORY:  He is married.  He is retired. He continues to smoke a  pack of cigarettes per day and has done so for  70+ years.   PHYSICAL EXAMINATION:  Vital signs:  Blood pressure is 160/ 75, heart  rate 63, respirations 22.  General:  He is an elderly male who is in no  apparent distress, alert and oriented x3.  Chest:  Clear to  auscultation.  Cardiovascular:  Regular rhythm, no murmurs.  Abdomen:  Soft, nontender with no masses.  Lower extremity exam:  Reveals mild to  moderate tenderness along the course of the proximal left great  saphenous vein from the saphenofemoral junction to the mid thigh with  some moderate ecchymosis.  The varicosities in the thigh and diffusely  through the calf are much less tense than prelaser and he has minimal  edema having worn the stocking to the office today.  He has diffuse  varicosities in the right leg similar to the left with documented reflux  throughout the right great  saphenous vein.   He will return in a few weeks for laser ablation of his right great  saphenous vein.  We will then see him back in 3 months to evaluate him  for potential stab phlebectomies depending on his symptomatology.     Quita Skye Hart [place], M.D.  Electronically Signed   JDL/MEDQ  D:  04/22/2010  T:  04/23/2010  Job:  [id number]

## 2010-12-17 NOTE — Procedures (Signed)
DUPLEX ULTRASOUND OF ABDOMINAL AORTA   INDICATION:  History of abdominal aortic aneurysm.   HISTORY:  Diabetes:  No.  Cardiac:  MI, CHF.  Hypertension:  Yes.  Smoking:  Yes.  Connective Tissue Disorder:  Family History:  No.  Previous Surgery:  No.   DUPLEX EXAM:         AP (cm)                   TRANSVERSE (cm)  Proximal             2.6 cm                    2.8 cm  Mid                  2.6 cm                    2.4 cm  Distal               2.7 cm                    2.9 cm  Right Iliac          1.4 cm                    1.3 cm  Left Iliac           1.4 cm                    1.3 cm   PREVIOUS:  Date:  02/15/2008  AP:  2.8  TRANSVERSE:  2.8   IMPRESSION:  1. Stable abdominal aortic diameter measurements noted when compared      to the previous exam of 02/15/2008.  2. Decreased visualization of the abdominal aorta noted due to      overlying bowel gas patterns and patient body habitus.   ___________________________________________  Di Kindle. Edilia Bo, M.D.   CH/MEDQ  D:  02/20/2009  T:  02/21/2009  Job:  [id number]

## 2010-12-17 NOTE — Procedures (Signed)
DUPLEX ULTRASOUND OF ABDOMINAL AORTA   INDICATION:  Followup for known abdominal aortic aneurysm   HISTORY:  Diabetes:  No  Cardiac:  MI, congestive heart failure  Hypertension:  Yes  Smoking:  No  Connective Tissue Disorder:  Family History:  No  Previous Surgery:  No   DUPLEX EXAM:         AP (cm)                   TRANSVERSE (cm)  Proximal             2.4 cm                    2.3 cm  Mid                  2.6 cm                    2.5 cm  Distal               2.1 cm                    2.0 cm  Right Iliac          1.5 cm                    1.8 cm  Left Iliac           1.5 cm                    1.7 cm   PREVIOUS:  Date:  AP:  2.5  TRANSVERSE:  2.8   IMPRESSION:  Abdominal aortic aneurysm measurements are stable from  previous study.   ___________________________________________  Di Kindle. Edilia Bo, M.D.   MC/MEDQ  D:  02/11/2007  T:  02/11/2007  Job:  [id number]

## 2010-12-17 NOTE — Letter (Signed)
July 20, 2007    Arturo Morton. Riley Kill, MD, [facility]  [address]  [address]  [place], [place] [address]   RE:  KALONJI, ZURAWSKI  MRN:  [id number]  /  DOB:  Dec 23, 1929   Dear Elijah Birk,   It was a pleasure to see Tyrees Chopin and his wife today regarding his  bradycardia and his ventricular ectopy.   As you know, he has a history of ischemic heart disease with prior  occult occlusions of his LAD and his acute marginal, of which there has  been collateralization, and he maintains only modest depression of LV  systolic function with an ejection fraction of 45% within the last month  and mild-to-moderate aortic stenosis with a mean transient aortic valve  gradient of 9 and aortic valve area of 1.   He hurt his leg and went to the hospital.  There, significant  bradycardia was of concern by palpation.  Ultimately, you identified  that he had ventricular ectopy, creating a pattern of bigeminy,  resulting in a low pulse.  You undertook a Holter monitor, which was  notable for nocturnal bradycardia, some short runs of SVT, and then  moderate ventricular ectopy, comprising about 6% of his total beats, and  heart rate excursion from the 50s to the 90s.   The patient has good days and bad days, but he is not quite sure what  the difference is in a day between the two.  He has some shortness of  breath and some fatigue.  He has some dizziness when he bends over.   He has significant daytime somnolence.  He underwent a sleep study last  May, which was not particularly remarkable.  His wife does say he has an  obstructive snoring pattern.   Interestingly, he can still dance the night away without significant  exercise intolerance, at least as best as he admits.   His medications include aspirin and Diovan.   He has no known drug allergies.   SOCIAL HISTORY:  He is married.  He does not use cigarettes, alcohol, or  recreational drugs.  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   PHYSICAL EXAMINATION:  His  blood pressure is 146/60, pulse 57.  His  weight was 208.  HEENT:  No icterus or xanthomata.  NECK:  Neck veins are flat.  Carotids are brisk and full bilaterally  without bruits.  BACK:  Without kyphosis or scoliosis.  LUNGS:  Clear.  CARDIAC:  Heart sounds were regular with a 2/6 systolic murmur.  S2 was  almost single but was mildly late peaking.  ABDOMEN:  Protuberant but soft.  Femoral pulses were 2+.  Distal pulses were intact.  There is no  clubbing, cyanosis or edema.  NEUROLOGIC:  Grossly normal.   Review of the Holter monitors as noted previously.   IMPRESSION:  1. Modest bradycardia and question of chronotropic incompetence.  2. Frequent ventricular ectopy.  3. Ischemic heart disease.      a.     Prior spontaneous occlusions and collateralization.      b.     Ejection fraction of 45%.  4. Moderate aortic stenosis.  5. Symptoms suggestive of more sleep apnea than was identified in his      sleep study.   Elijah Birk, Mr. Cogliano has ventricular ectopy and some bradycardia.  His symptom  complex is not clearly related to bradycardia.  He is able to dance,  suggestive that he has adequate chronotropic reserve.  This  is supported  by his Holter, although it is not diagnostic.  I have suggested to the  family that perhaps stress testing, specifically in the form of  cardiopulmonary stress testing might not help [place] understand to what  degree his heart rates are in fact limited, and to what degree does he  feign exercise tolerance to a greater degree than is true.  His wife was  nodding in the background as I mentioned this.   We will plan to see what that shows, and I will forward those results to  you as well.  I did discuss with them pacemaker implantation.  I do not  think there is any reason, given his nocturnal bradycardia, that this  sufficiently justifies pacemaker implantation and unless we identify  symptoms that we think we can improve, I am not sure we can proceed   either.  They were quite  happy with that, as they would not like to have a pacemaker unless there  is a strong suggestion of benefit.   Thank you for the consultation.    Sincerely,      Duke Salvia, MD, [facility]  Electronically Signed    SCK/MedQ  DD: 07/20/2007  DT: 07/20/2007  Job #: [id number]

## 2010-12-17 NOTE — Assessment & Plan Note (Signed)
OFFICE VISIT   Joe, Jackson  DOB:  1930-05-08                                       04/09/2010  EAVWU#:[id number]   Joe Jackson returns for further follow-up regarding his severe venous  insufficiency of both legs.  I evaluated him in May of this year for  severe painful varicosities in both thighs and calves secondary to gross  reflux in both great saphenous systems.  He also has some stable  claudication symptoms at about one half to one block.  He has been  wearing long-leg elastic compression stockings (20 mm-30 mm gradient)  and has tried elevation and ibuprofen for the last 3 months with no  improvement in his symptoms.  He continues to have aching, burning, and  throbbing discomfort in the thighs and calves which worsens as the day  progresses.   On physical exam, he has 3+ femoral and 2+ popliteal pulses bilaterally.  Both feet are well-perfused.  He does have bulging varicosities  throughout the thigh and calf areas in both legs which are quite  impressive with some early hyperpigmentation distally.   I think we should proceed with laser ablation of the left great  saphenous vein followed by laser ablation of the right great saphenous  vein.  After 3 months, will reevaluate him for possible stab  phlebectomies depending on the results.  We will schedule this first  procedure in the near future.     Quita Skye Hart [place], M.D.  Electronically Signed   JDL/MEDQ  D:  04/09/2010  T:  04/10/2010  Job:  [id number]

## 2010-12-17 NOTE — Procedures (Signed)
LOWER EXTREMITY VENOUS REFLUX EXAM   INDICATION:  Varicose veins with inflammation.   EXAM:  Using color-flow imaging and pulse Doppler spectral analysis, the  bilateral common femoral, superficial femoral, popliteal, posterior  tibial, greater and lesser saphenous veins were evaluated.  There is  evidence suggesting deep venous insufficiency in the left lower  extremity.   The bilateral saphenofemoral junction is not competent with reflux of  >500 milliseconds.  Bilateral GSV is not competent with reflux of  >528milliseconds.   The bilateral proximal short saphenous vein demonstrates competency.   GSV Diameter (used if found to be incompetent only)                                            Right    Left  Proximal Greater Saphenous Vein           0.81 cm  1.43 cm  Proximal-to-mid-thigh                     0.73 cm  1.17 cm  Mid thigh                                 1.01 cm  1.17 cm  Mid-distal thigh                          0.41 cm  1.46 cm  Distal thigh                              0.42 cm  0.99 cm  Knee                                      0.54 cm  0.80 cm    IMPRESSION:  1. Bilateral greater saphenous vein reflux with >500 milliseconds is      identified with the caliber ranging from 0.41 cm to 1.01 cm knee to      groin on the right and 0.80 cm to 1.43 cm on the left.  2. The bilateral greater saphenous vein is not aneurysmal.  3. The right greater saphenous vein is tortuous.  4. The deep venous system is not competent with reflux of >500      milliseconds on the left leg.  5. The bilateral  left lesser saphenous vein is competent.         ___________________________________________  Quita Skye. Hart [place], M.D.   CB/MEDQ  D:  01/01/2010  T:  01/01/2010  Job:  [id number]

## 2010-12-17 NOTE — H&P (Signed)
NAMETAJ, NEVINS                 ACCOUNT NO.:  [id number]   MEDICAL RECORD NO.:  [id number]          PATIENT TYPE:  INP   LOCATION:  1507                         FACILITY:  [facility]   PHYSICIAN:  Rosalyn Gess. Norins, MD  DATE OF BIRTH:  07-Sep-1929   DATE OF ADMISSION:  09/27/2007  DATE OF DISCHARGE:                              HISTORY & PHYSICAL   CHIEF COMPLAINT:  Increased shortness of breath.   HISTORY OF PRESENT ILLNESS:  Mr. Garner is a 77 year old Caucasian  gentleman followed by Dr. Laury Axon for primary care, Dr. Vassie Loll for  pulmonary, Dr. Riley Kill for cardiology, Dr. Graciela Husbands for electrophysiology  and Dr. Durwin Nora for vascular.  The patient developed increased shortness  breath and wheezing on Saturday with progressive shortness of breath.  He was seen at an urgent care in [place].  Evaluation revealed on  x-ray a question of pulmonary edema and he was told to proceed to an  emergency department.  He did proceed to [facility] Emergency  Department for evaluation.   The patient has a history of pulmonary idiopathic fibrosis alveolitis  followed by Dr. Vassie Loll.  Last office visit August 13, 2007 including  pulmonary function studies at that time.  The patient by family's report  when he arrived at [facility] Emergency Department was ashen, wheezing  and short of breath.  In the emergency department, his evaluation  revealed a normal white blood count.  Chest x-ray with chronic  interstitial changes, but no acute disease, BNP was 200.  The patient  was given IV Solu-Medrol 125 mg as well Xopenex handheld nebulizer  treatment and had significant improvement.  Patient is now for admission  with probable viral bronchitis with asthmatic component exacerbating his  PIF for treatment with IV steroids.   PAST MEDICAL HISTORY:  1. Patient with known CAD with ischemic cardiomyopathy with ejection      fraction of 45%.  2. Aortic stenosis with a 1 cm valve area as of a 2-D echo from  September 14, 2006.  3. Idiopathic pulmonary fibrosis and valvulitis.  4. Aortic aneurysm abdominal followed by Dr. Durwin Nora.  5. Sleep apnea.  6. Hypertension.   PAST SURGICAL HISTORY:  Not available.   CURRENT MEDICATIONS:  1. Diovan/HCT 160/25 once daily.  2. Aspirin.  3. Multivitamins.  4. Mucinex as needed.   FAMILY HISTORY:  Noncontributory.   SOCIAL HISTORY:  The patient is retired.  He has a very supportive  family who is present with him in the emergency department.  The patient  does maintain a fairly high level of activity at home.   REVIEW OF SYSTEMS:  The patient has had no chills.  He has had low-grade  fever at home.  The patient has had no headache or neurologic  complaints.  He has had no chest pain or chest discomfort.  His weight  has been stable.  He has had no GI complaints.   PHYSICAL EXAMINATION:  VITAL SIGNS:  Temperature 98.3, blood pressure  140/90, heart rate 86, respirations 20, O2 sats 95% on room air.  GENERAL APPEARANCE:  This  is an elderly Caucasian male in no acute  distress with no increased work of breathing.  HEENT:  Normocephalic, atraumatic.  Conjunctivae and sclerae were clear.  Oropharynx without lesions.  Nose:  No adenopathy was noted in the  submandibular, cervical or supraclavicular regions.  NECK:  Supple.  No JVD was noted.  CHEST:  The patient has good breath sounds.  He has no increased work of  breathing using no accessory muscles of respiration.  He does have end-  expiratory wheezing that is faint.  CARDIOVASCULAR:  2+ radial pulses.  Precordium was quiet.  He had a  regular rate and rhythm with a 3/6 systolic murmur heard best at the  right sternal border.  ABDOMEN:  Obese.  Positive bowel sounds were noted throughout.  There is  no tenderness.  No organosplenomegaly.  GENITALIA/RECTAL:  Deferred.  EXTREMITIES:  Without edema.   LABORATORY DATA:  Hemoglobin was 13.8 grams, white count 8700 with 49%  segs, 18% lymphs, 2%  monos, 31% eosinophils, sodium 139, potassium 4.0,  chloride 102, CO2 29, BUN 17, creatinine 1.27, glucose 147.  LFTs were  normal.   ASSESSMENT/PLAN:  1. Pulmonary:  Patient with pulmonary interstitial fibrosis      exacerbated by a viral versus atypical bronchitis with an asthmatic      component.  Plan nontelemetry admission.  We will treat with      azithromycin orally.  We will continue IV Solu-Medrol at 80 mg IV      q.12 h. x2 doses and then reassess as to whether he needs continued      IV taper or can be switched to oral prednisone.  We will use ProAir      meter-dose inhaler 2 puffs 4 times a day.  2. Cardiovascular:  Patient with known coronary artery disease, seen      regularly by Dr. Riley Kill and deemed to be reasonably cardiac      stable.  He is also followed for aortic stenosis which also seems      stable.  3. Hypertension.  The patient will be continued on his home      medication.      Rosalyn Gess Norins, MD  Electronically Signed     MEN/MEDQ  D:  09/27/2007  T:  09/28/2007  Job:  [id number]

## 2010-12-17 NOTE — Procedures (Signed)
DUPLEX DEEP VENOUS EXAM - LOWER EXTREMITY   INDICATION:  Followup left GSV ablation.   HISTORY:  Edema:  Yes.  Trauma/Surgery:  Yes.  Pain:  Yes.  PE:  No.  Previous DVT:  No.  Anticoagulants:  No.  Other:   DUPLEX EXAM:                CFV   SFV   PopV  PTV    GSV                R  L  R  L  R  L  R   L  R  L  Thrombosis       0     0     0      0     +  Spontaneous      +     +     +      +     0  Phasic           +     +     +      +     0  Augmentation     +     +     +      +     0  Compressible     +     +     +      +     0  Competent        0     +     +      +     0   Legend:  + - yes  o - no  p - partial  D - decreased   IMPRESSION:  1. Left lower extremity deep veins appear negative for deep venous      thrombosis.  2. Left greater saphenous vein appears thrombosed beginning at the      saphenofemoral junction through the distal insertion site.  3. Small amount of reflux noted in the greater saphenous vein at the      level of the distal thigh.         _____________________________  Quita Skye. Hart [place], M.D.   EM/MEDQ  D:  04/22/2010  T:  04/22/2010  Job:  [id number]

## 2010-12-17 NOTE — Assessment & Plan Note (Signed)
OFFICE VISIT   Joe Jackson, Joe Jackson  DOB:  1930-04-14                                       04/15/2010  ZOXWR#:[id number]   The patient had laser ablation of his left great saphenous vein today  for painful varicosities secondary to severe reflux and valvular  incompetence in the left great saphenous system.  He tolerated the  procedure well.  He will return in 1 week for a followup venous duplex  exam to check for closure of the great saphenous vein.     Quita Skye Hart Rochester, M.D.  Electronically Signed   JDL/MEDQ  D:  04/15/2010  T:  04/16/2010  Job:  [id number]

## 2010-12-17 NOTE — Consult Note (Signed)
NEW PATIENT CONSULTATION   BRYTEN, MAHER  DOB:  1930/04/30                                       01/01/2010  BJYNW#:[id number]   REFERRING:  Dr. Lelon Perla   The patient is a 77 year old male patient with severe venous  insufficiency for 20+ years which has been progressively becoming worse.  He has had episodes of pain and swelling in the left posterior calf  which radiates into the pretibial area and worsens as the day  progresses.  His ankles both become swollen as the day progresses and he  has bulging varicosities causing aching and cramping discomfort.  He has  no history of deep venous thrombosis, bleeding, or thrombophlebitis.  He  does have calf claudication symptoms as well, the left worse than the  right, which affect him after walking about 1/2 to 1 block and no  history of gangrene.  He has been wearing long-leg elastic compression  stockings (20 mm - 30 mm gradient) for at least 6 to 8 months prescribed  by Dr. Laury Axon, his medical doctor.  He also elevates his legs frequently  and tries pain medication without improvement in his symptomatology.   CHRONIC MEDICAL PROBLEMS:  1. Coronary artery disease followed by Dr. Bonnee Quin.  2. Small abdominal aortic aneurysm followed in the past by Dr.      Edilia Bo, not seen in our office in several years.  3. Hypertension.  4. Hyperlipidemia.  5. COPD.   FAMILY HISTORY:  Positive for coronary artery disease in his mother.  Negative for diabetes and stroke.   SOCIAL HISTORY:  He is married.  He is retired.  He smokes a pack of  cigarettes per day and has done so for 70+ years.  He does not use  alcohol.   REVIEW OF SYSTEMS:  Positive for occasional dyspnea occasionally at rest  but also with ambulation.  This was about same time he developed  claudication symptoms in his calves.  He also has occasional wheezing,  arthritis, joint pain.  All other systems in the review of systems are  negative.   PHYSICAL EXAMINATION:  Blood pressure 163/76, heart rate 51,  respirations 24.  General:  He is a well-developed, well-nourished male  who is in no apparent distress.  He is alert and oriented x3.  HEENT  exam is normal for his age.  EOMs intact.  Chest is clear with no  evidence of rhonchi or wheezing.  Cardiovascular exam reveals a regular  rhythm, no murmurs.  Carotid pulses are 3+ with no audible bruits.  No  adenopathy is noted.  Abdomen:  Soft, nontender with no palpable masses.  Musculoskeletal exam is free of major deformities.  Neurologic exam is  normal.  Lower extremity exam reveals right leg has 3+ femoral, 2+  popliteal pulse.  No distal pulses palpable.  Left leg has a 3+ femoral,  1+ popliteal, and no distal pulses.  He has severe bulging varicosities  in both legs, left greater than right, involving the greater saphenous  system from the midthigh down into the medial calf anterior-pretibial  region as well as posteriorly in the ankle area with 1+ edema.  No  hyperpigmentation or ulceration on the left.  The right leg has large  bulging varicosities starting slightly further distally in the thigh,  extending medially into the  calf and pretibial region, and laterally.  1+ edema on the right is noted.   Today, I ordered a venous duplex exam which I have reviewed and  interpreted.  He has a normal deep system on the right with gross reflux  throughout the right great saphenous vein down to the knee level and at  the junction.  On the left, he has gross reflux throughout the left  great saphenous vein and a tortuous vein from the midthigh distally  feeding these bulging varicosities with some mild reflux in the deep  system with no DVT.   ASSESSMENT:  This patient had large bulging varicosities secondary to  bilateral greater saphenous reflux and should have the following  treatment.  1. Laser ablation of the left great saphenous vein.  2. Laser ablation of the right great  saphenous vein.   At a later date, we can evaluate him for possible stab phlebectomies if  that appears to be necessary, but not as an initial treatment.  We will  proceed with precertification since this patient has tried all medical  management over the last 6 to 8 months with no improvement.     Quita Skye Hart Rochester, M.D.  Electronically Signed   JDL/MEDQ  D:  01/01/2010  T:  01/02/2010  Job:  [id number]
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
## 2010-12-20 NOTE — Cardiovascular Report (Signed)
[place]  Patient:    Joe Jackson, Joe Jackson                       MRN: [id number] Proc. Date: 11/19/99 Attending:  Arturo Morton. Riley Kill, M.D. [facility] CC:         Charlaine Dalton. Sherene Sires, M.D. [facility]             Thomas D. Riley Kill, M.D. [facility]             CV Laboratory                        Cardiac Catheterization  INDICATIONS:  Mr. Yerger is a very delightful 77 year old white married male well known to me.  He has mild aortic stenosis and coronary artery disease.  He has ad some shortness of breath with activity.  He has had increasing fatigue as of late. He is on lipid-lowering therapy.  He has had known previous catheterization with occlusion of the circumflex marginal and right coronary with mild disease of the LAD.  As a result of this, we have recommended repeat catheterization because of his increased symptoms.  Risks, benefits and alternatives were discussed with the patient in detail.  He agreed to proceed.  PROCEDURES:  Left heart catheterization, selective coronary angiography, selective left ventriculography, distal aortography.  DESCRIPTION OF PROCEDURE:  The procedure was performed from the right femoral artery using 6 French catheters.  He tolerated the procedure without complication. Despite the heavy calcification of the aortic valve, we were able to cross relatively easily with a wire and pigtail catheter, a there was not a significant gradient across the valve.  Hence, right heart catheterization was not performed as a part of the procedure.  He tolerated the procedure without complication.  HEMODYNAMICS:  The central aortic pressure was 170/86. LV pressure 175/23. There was no gradient on pullback across the aortic valve.  ANGIOGRAPHIC DATA:  On plain fluoroscopy there is extensive calcification of both the left anterior descending artery as well as the aortic root.  The right coronary artery is also relatively calcified.  The left  main coronary artery is a fairly large caliber vessel but is free of significant disease.  The left anterior descending artery courses to the apex.  There is mild luminal  irregularity in the proximal to midportion of the vessel with heavy calcification and luminal narrowing of about perhaps 30% leading into a bend of the vessel. Beyond this the vessel bifurcates into a diagonal and LAD system that provides he distal vessel and this area appears to be free of critical disease.  The distal LAD system and diagonal system fill the circumflex marginal by retrograde collaterals.  The circumflex has mild luminal irregularity proximally and then the marginal is totally occluded just after the takeoff of the distal circumflex.  The distal AV circumflex supplies collaterals to the distal right coronary circulation.  The right coronary artery demonstrates 80% stenosis in the proximal mid bend and then a subtotal distally and the totally occluded distally.  There is collateralization through a right ventricular branch into the PDA system.  LEFT VENTRICULOGRAPHY:  Ventriculography in the RAO projection reveals very slight restriction of the aortic leaflets.  There is inferior wall hypokinesis, preserved overall LV function with an ejection fraction of 54% which is identical to the previous catheterization.  The distal aortogram demonstrates diffuse atherosclerotic change in the aorta. The renal arteries appear to be  patent.  There is diffuse atherosclerotic change. There appears to be some disease involving the proximal right iliac but there was no gradient across this area.  There is calcification in the iliac territory.  There is also evidence of irregularities in the iliac system bilaterally, in the internal iliac on the left with an ostial stenosis.  CONCLUSIONS: 1. Mild reduction in left ventricular function with inferior wall hypokinesis. 2. Mild aortic valvular stenosis with  heavy calcification of the valve, minimal  restriction of the leaflets, and no significant aortic valve gradient on    pullback. 3. Total occlusion of the circumflex marginal and distal right coronary arteries    as in previous catheterization. 4. Collateralization to the distal circumflex and distal right coronary    circulation as described above. 5. Heavy calcification of the left anterior descending without critical stenosis.  A pO2 was 66 on room air.  DISPOSITION:  We would recommended discontinuation of smoking.  The patient has  been unable to do this and I will talk with him again about possible opportunities with regard to smoking discontinuation.  With regard to lipid-lowering therapy, I would continue this at the present time as it seems to have been doing a good job with regard to stability of his coronary anatomy.  With a slightly reduced pO2,  again addressing his pulmonary situation would be worth while.  I will have him  followup with Dr. Sherene Sires as well. DD:  11/19/99 TD:  11/20/99 Job: [id number]

## 2010-12-20 NOTE — Letter (Signed)
October 30, 2006    Lelon Perla, DO  [address] [place], [place] [address]   RE:  Joe Jackson, Joe Jackson  MRN:  [id number]  /  DOB:  [birth year]   Dear [name]:   I saw Joe Jackson emergently October 26, 2006,  for frequent  urination/nocturia.  In reality, there were multiple concerns raised by  him and his daughter who accompanied him.   He actually had symptoms of nocturia and frequency for 3-4 weeks  associated with urgency.  The frequency is 4-5 times per day.  He has  been seen by Dr. Isabel Caprice, Urologist, but he is unsure why.   He has dyslipidemia, coronary artery disease and abdominal aortic  aneurysm.  Unfortunately, he continues to smoke a pack per day.   His wife is concerned that he sleeps a lot and has questions of apnea.  He has daytime somnolence.  Has history of congestive heart failure as  well.   He is followed by Dr. Durwin Nora annually for the aortic aneurysm.   Additionally, he was concerned by a place on the back.  He has had  pruritus for 3-4 weeks over the low back and testicles.   His past record was reviewed as well as the medication list.  [facility] has  [facility] and is supposed to be on Crestor, but  only takes it sometimes because of cramps.  He has previously been  prescribed Chantix but did not take this.   His weight was up approximately 2-1/2 pounds to q.13, pulse 16 and  regular, respirations 18, blood pressure 157/74.  There was no evidence  of heart failure at this time.  Specifically, there was no neck vein  distention or hepatojugular reflux.  Trace edema was noted.  Pedal  pulses were intact.  A flow murmur was present.  Abdomen is protuberant,  and there is a ventral hernia.   Hemmorhoidal tags were noted; hemoccult testing was negative.  The  scrotal exam was unremarkable.  There was a granuloma on the left of no  clinical import.   There were minimal changes over the lumbosacral area.  Lipomas versus an  epidermoid  inclusion cyst was present on the left buttocks.   Urine revealed trace blood; he was placed on Septra DS twice a day for  10 days with follow up urinalysis at that time.  Culture and sensitivity  is pending.   I would recommend scheduling A1c as he appears to have the metabolic  syndrome, and quite likely, has diabetes in addition to dyslipidemia.  He has not been compliant with therapy for the dyslipidemia.   For his daytime somnolence, sleep study will be performed.  Full thyroid  function test will also be appropriate.   For the minor changes of pruritic rash, I recommended Cortaid mixed with  Eucerin 1:1 applied once or twice a day.   If the hematuria persists he should be referred to Dr. Isabel Caprice for  reevaluation.  The A1c is certainly indicated in view of his history,  but also because of the fasting glucose of 102 noted in August of 2007.  At that time, his PSA was normal at 0.73.    Sincerely,      Titus Dubin. Alwyn Ren, MD,FACP,FCCP  Electronically Signed    WFH/MedQ  DD: 10/30/2006  DT: 10/30/2006  Job #: [id number]

## 2011-01-07 ENCOUNTER — Ambulatory Visit (INDEPENDENT_AMBULATORY_CARE_PROVIDER_SITE_OTHER): Payer: Medicare PPO | Admitting: Family Medicine

## 2011-01-07 ENCOUNTER — Encounter: Payer: Self-pay | Admitting: Family Medicine

## 2011-01-07 VITALS — BP 118/64 | HR 59 | Temp 98.7°F | Wt 206.8 lb

## 2011-01-07 DIAGNOSIS — J441 Chronic obstructive pulmonary disease with (acute) exacerbation: Secondary | ICD-10-CM

## 2011-01-07 DIAGNOSIS — E785 Hyperlipidemia, unspecified: Secondary | ICD-10-CM

## 2011-01-07 DIAGNOSIS — R059 Cough, unspecified: Secondary | ICD-10-CM

## 2011-01-07 DIAGNOSIS — R062 Wheezing: Secondary | ICD-10-CM

## 2011-01-07 DIAGNOSIS — R05 Cough: Secondary | ICD-10-CM

## 2011-01-07 MED ORDER — IPRATROPIUM BROMIDE 0.02 % IN SOLN
0.5000 mg | Freq: Once | RESPIRATORY_TRACT | Status: AC
  Administered 2011-01-07: 0.5 mg via RESPIRATORY_TRACT

## 2011-01-07 MED ORDER — PREDNISONE 10 MG PO TABS: 10.0000 mg | ORAL_TABLET | Freq: Every day | ORAL | Status: AC

## 2011-01-07 MED ORDER — ALBUTEROL SULFATE (2.5 MG/3ML) 0.083% IN NEBU
2.5000 mg | INHALATION_SOLUTION | Freq: Once | RESPIRATORY_TRACT | Status: AC
  Administered 2011-01-07: 2.5 mg via RESPIRATORY_TRACT

## 2011-01-07 MED ORDER — IPRATROPIUM BROMIDE 0.02 % IN SOLN: 0.5000 mg | RESPIRATORY_TRACT | Status: DC

## 2011-01-07 MED ORDER — ALBUTEROL SULFATE (2.5 MG/3ML) 0.083% IN NEBU: 2.5000 mg | INHALATION_SOLUTION | RESPIRATORY_TRACT | Status: DC

## 2011-01-07 MED ORDER — PREDNISONE 10 MG PO TABS: 10.0000 mg | ORAL_TABLET | Freq: Every day | ORAL | Status: DC

## 2011-01-07 NOTE — Patient Instructions (Signed)
Chronic Obstructive Pulmonary Disease (COPD)       Chronic obstructive pulmonary disease (COPD) is a condition in which airflow from the lungs is restricted. The lungs can never return to normal, but there are measures you can take which will improve them and make you feel better.     CAUSES   Smoking.   Breathing in irritants (pollution, cigarette smoke, strong odors, aerosol sprays, paint fumes).   History of lung infections.     TREATMENT   Treatment focuses on making you comfortable (supportive care).     HOME CARE INSTRUCTIONS   If you smoke, stop smoking. The carbon monoxide buildup in the blood robs you of your already short oxygen supply.   Take medicines (antibiotics) that kill germs as directed.     Avoid antihistamines and cough syrups. They dry up your system and slow down the elimination of secretions. This decreases respiratory capacity and may lead to infections.   Drink enough water and fluids to keep your urine clear or pale yellow. This loosens secretions.   Use humidifiers at home and at your bedside if they do not make breathing difficult.   Receive all protective vaccines your caregiver suggests, especially pneumococcal and influenza.   Use home oxygen as suggested.     SEEK MEDICAL CARE IF:   You develop pus-like mucus (sputum).   You have an oral temperature above 100.4.   Breathing is more labored or exercise becomes difficult to do.   You are running out of the medicine you take for your breathing.     SEEK IMMEDIATE MEDICAL CARE IF:   You have a rapid heart rate.   You have agitation, confusion, tremors, or are in a stupor (family members may need to observe this).   It becomes difficult to breathe.   You develop chest pain.   You have an oral temperature above 100.4, not controlled by medicine.     MAKE SURE YOU:    Understand these instructions.    Will watch your condition.   Will get help right away if you are not doing well or get worse.      Document Released: 04/30/2005  Document Re-Released: 10/15/2009  ExitCare Patient Information 2011 [employer].

## 2011-01-07 NOTE — Progress Notes (Signed)
  Subjective:     Joe Jackson is a 77 y.o. male here for evaluation of a cough. Onset of symptoms was 1 day ago. Symptoms have been gradually worsening since that time. The cough is barky and is aggravated by exercise and reclining position. Associated symptoms include: wheezing. Patient does not have a history of asthma. Patient does have a history of environmental allergens. Patient has not traveled recently. Patient does have a history of smoking. Patient has had a previous chest x-ray. Patient has not had a PPD done.  The following portions of the patient's history were reviewed and updated as appropriate: allergies, current medications, past family history, past medical history, past social history, past surgical history and problem list.  Review of Systems Pertinent items are noted in HPI.    Objective:    Oxygen saturation 96% on room air BP 118/64  Pulse 59  Temp(Src) 98.7 F (37.1 C) (Oral)  Wt 206 lb 12.8 oz (93.804 kg)  SpO2 96% General appearance: alert, cooperative, appears stated age and mild distress Lungs: rhonchi bilaterally and wheezes bilaterally Heart: S1, S2 normal Extremities: extremities normal, atraumatic, no cyanosis or edema    Assessment:    COPD with exacerbation    Plan:    Explained lack of efficacy of antibiotics in viral disease. Avoid exposure to tobacco smoke and fumes. B-agonist inhaler. Call if shortness of breath worsens, blood in sputum, change in character of cough, development of fever or chills, inability to maintain nutrition and hydration. Avoid exposure to tobacco smoke and fumes. prednisone taper

## 2011-02-21 ENCOUNTER — Other Ambulatory Visit: Payer: Medicare PPO

## 2011-02-21 ENCOUNTER — Encounter (INDEPENDENT_AMBULATORY_CARE_PROVIDER_SITE_OTHER): Payer: Medicare PPO

## 2011-02-21 DIAGNOSIS — I714 Abdominal aortic aneurysm, without rupture: Secondary | ICD-10-CM

## 2011-02-26 NOTE — Procedures (Unsigned)
DUPLEX ULTRASOUND OF ABDOMINAL AORTA  INDICATION:  Followup abdominal aortic aneurysm.  HISTORY: Diabetes:  No. Cardiac:  MI; CHF. Hypertension:  Yes. Smoking:  Yes. Connective Tissue Disorder: Family History:  No. Previous Surgery:  No.  DUPLEX EXAM:         AP (cm)                   TRANSVERSE (cm) Proximal             2.70 cm                   2.86 cm Mid                  3.00 cm                   3.11 cm Distal               2.96 cm                   3.22 cm Right Iliac          2.29 cm                   2.33 cm Left Iliac           1.42 cm                   1.84 cm  PREVIOUS:  Date:  02/20/2009  AP:  2.7  TRANSVERSE:  2.9  IMPRESSION: 1. Aneurysmal dilatation of the right common iliac artery measuring     2.29 cm x 2.33 cm. 2. Aneurysmal dilatation of the mid aorta measuring 3.0 cm x 3.1 cm. 3. Aneurysmal dilatation of the distal aorta measuring 2.96 cm x 3.22     cm. 4. All above dilatations do not present with intramural thrombus. 5. Increase in the disease process since study on 02/20/2009.  ___________________________________________ Di Kindle. Edilia Bo, M.D.  SH/MEDQ  D:  02/21/2011  T:  02/21/2011  Job:  [id number]

## 2011-02-28 ENCOUNTER — Other Ambulatory Visit: Payer: Medicare PPO

## 2011-03-04 ENCOUNTER — Other Ambulatory Visit (INDEPENDENT_AMBULATORY_CARE_PROVIDER_SITE_OTHER): Payer: Medicare PPO

## 2011-03-04 DIAGNOSIS — E785 Hyperlipidemia, unspecified: Secondary | ICD-10-CM

## 2011-03-04 LAB — HEPATIC FUNCTION PANEL
AST: 17 U/L (ref 0–37)
Albumin: 4 g/dL (ref 3.5–5.2)
Alkaline Phosphatase: 103 U/L (ref 39–117)
Total Protein: 7.7 g/dL (ref 6.0–8.3)

## 2011-03-04 LAB — LIPID PANEL
Cholesterol: 164 mg/dL (ref 0–200)
HDL: 33.9 mg/dL — ABNORMAL LOW (ref >39.00)
LDL Cholesterol: 100 mg/dL — ABNORMAL HIGH (ref 0–99)
Total CHOL/HDL Ratio: 5
Triglycerides: 153 mg/dL — ABNORMAL HIGH (ref 0.0–149.0)

## 2011-03-04 LAB — BASIC METABOLIC PANEL
CO2: 28 mEq/L (ref 19–32)
Calcium: 9 mg/dL (ref 8.4–10.5)
Creatinine, Ser: 1.3 mg/dL (ref 0.4–1.5)
Sodium: 140 mEq/L (ref 135–145)

## 2011-03-04 NOTE — Progress Notes (Signed)
12  

## 2011-03-06 ENCOUNTER — Telehealth: Payer: Self-pay

## 2011-03-06 MED ORDER — ATORVASTATIN CALCIUM 20 MG PO TABS: 20.0000 mg | ORAL_TABLET | Freq: Every day | ORAL | Status: DC

## 2011-03-06 NOTE — Telephone Encounter (Signed)
Message copied by Arnette Norris on Thu Mar 06, 2011  2:40 PM ------      Message from: Lelon Perla      Created: Thu Mar 06, 2011 11:06 AM       Cholesterol--- LDL goal < 100,  HDL >40,  TG < 150.  Diet and exercise will increase HDL and decrease LDL and TG.  Fish,  Fish Oil, Flaxseed oil will also help increase the HDL and decrease Triglycerides.   Recheck labs in 3 months.   Try to work on diet and exercise---increase lipitor to 20 mg daily #30  2 refills----- if TG are still elevated in 3 months-----we may need to add meds.  Also ---- BUN elevated-- pt needs to drink more water---at least 40 oz a day.      272.4 401.9  Bmp, hep, lipid

## 2011-03-06 NOTE — Telephone Encounter (Signed)
Discussed with patient and he wanted me to inform his wife. Discussed with [facility] and she voiced understanding will have him increase water because she thinks he is dehydrated. Rx faxed to [facility] on [address] in [place]    KP

## 2011-04-25 LAB — CBC
MCV: 83.6
Platelets: 175
RBC: 5
WBC: 8.7

## 2011-04-25 LAB — COMPREHENSIVE METABOLIC PANEL
ALT: 13
AST: 13
Albumin: 3.2 — ABNORMAL LOW
Alkaline Phosphatase: 94
CO2: 29
Chloride: 102
Creatinine, Ser: 1.27
GFR calc Af Amer: >60
GFR calc non Af Amer: 55 — ABNORMAL LOW
Potassium: 4
Sodium: 139
Total Bilirubin: 0.9

## 2011-04-25 LAB — DIFFERENTIAL
Blasts: 0
Lymphocytes Relative: 18
Neutrophils Relative %: 49
Promyelocytes Absolute: 0

## 2011-04-25 LAB — B-NATRIURETIC PEPTIDE (CONVERTED LAB): Pro B Natriuretic peptide (BNP): 205 — ABNORMAL HIGH

## 2011-04-25 LAB — D-DIMER, QUANTITATIVE: D-Dimer, Quant: 1.68 — ABNORMAL HIGH

## 2011-04-29 ENCOUNTER — Telehealth: Payer: Self-pay | Admitting: *Deleted

## 2011-04-29 NOTE — Telephone Encounter (Signed)
PC from pt wife requesting the names of previous lipid meds.  Pt is trying to get meds from [place] and Lipitor is not a preferred drug.  Advised [name] I would look in old chart and let her know.

## 2011-04-29 NOTE — Telephone Encounter (Signed)
RC to [name] with previous lipid meds.  Message left on her cell VM per her request.  Crestor 10 mg in 2007, Pravachol 40 mg in 09/2008, then lipitor in 07/2009.  Advised pt to call if any further needs.

## 2011-05-27 ENCOUNTER — Encounter: Payer: Self-pay | Admitting: Family Medicine

## 2011-05-27 ENCOUNTER — Ambulatory Visit (HOSPITAL_BASED_OUTPATIENT_CLINIC_OR_DEPARTMENT_OTHER)
Admission: RE | Admit: 2011-05-27 | Discharge: 2011-05-27 | Disposition: A | Discharge status: Home / Self Care | Payer: Medicare PPO | Source: Ambulatory Visit | Attending: Family Medicine | Admitting: Family Medicine

## 2011-05-27 ENCOUNTER — Ambulatory Visit (INDEPENDENT_AMBULATORY_CARE_PROVIDER_SITE_OTHER)
Admission: RE | Admit: 2011-05-27 | Discharge: 2011-05-27 | Disposition: A | Discharge status: Home / Self Care | Payer: Medicare PPO | Source: Ambulatory Visit | Attending: Family Medicine | Admitting: Family Medicine

## 2011-05-27 ENCOUNTER — Ambulatory Visit (INDEPENDENT_AMBULATORY_CARE_PROVIDER_SITE_OTHER): Payer: Medicare PPO | Admitting: Family Medicine

## 2011-05-27 VITALS — BP 148/78 | HR 60 | Temp 98.1°F | Wt 202.6 lb

## 2011-05-27 DIAGNOSIS — I509 Heart failure, unspecified: Secondary | ICD-10-CM

## 2011-05-27 DIAGNOSIS — M542 Cervicalgia: Secondary | ICD-10-CM

## 2011-05-27 DIAGNOSIS — J40 Bronchitis, not specified as acute or chronic: Secondary | ICD-10-CM

## 2011-05-27 DIAGNOSIS — R059 Cough, unspecified: Secondary | ICD-10-CM

## 2011-05-27 DIAGNOSIS — J8410 Pulmonary fibrosis, unspecified: Secondary | ICD-10-CM | POA: Insufficient documentation

## 2011-05-27 DIAGNOSIS — M5030 Other cervical disc degeneration, unspecified cervical region: Secondary | ICD-10-CM

## 2011-05-27 DIAGNOSIS — R05 Cough: Secondary | ICD-10-CM | POA: Insufficient documentation

## 2011-05-27 DIAGNOSIS — M47812 Spondylosis without myelopathy or radiculopathy, cervical region: Secondary | ICD-10-CM

## 2011-05-27 DIAGNOSIS — J984 Other disorders of lung: Secondary | ICD-10-CM

## 2011-05-27 MED ORDER — ALBUTEROL SULFATE (5 MG/ML) 0.5% IN NEBU
2.5000 mg | INHALATION_SOLUTION | Freq: Once | RESPIRATORY_TRACT | Status: AC
  Administered 2011-05-27: 2.5 mg via RESPIRATORY_TRACT

## 2011-05-27 MED ORDER — AZITHROMYCIN 250 MG PO TABS: ORAL_TABLET | ORAL | Status: AC

## 2011-05-27 MED ORDER — CYCLOBENZAPRINE HCL 10 MG PO TABS: 10.0000 mg | ORAL_TABLET | Freq: Three times a day (TID) | ORAL | Status: DC | PRN

## 2011-05-27 MED ORDER — PREDNISONE 10 MG PO TABS: ORAL_TABLET | ORAL | Status: DC

## 2011-05-27 MED ORDER — IPRATROPIUM BROMIDE 0.02 % IN SOLN
0.5000 mg | Freq: Once | RESPIRATORY_TRACT | Status: AC
  Administered 2011-05-27: 0.5 mg via RESPIRATORY_TRACT

## 2011-05-27 MED ORDER — TRAMADOL HCL 50 MG PO TABS: 50.0000 mg | ORAL_TABLET | Freq: Four times a day (QID) | ORAL | Status: DC | PRN

## 2011-05-27 NOTE — Patient Instructions (Signed)

## 2011-05-27 NOTE — Progress Notes (Signed)
  Subjective:     Joe Jackson is a 77 y.o. male here for evaluation of a cough. Onset of symptoms was 1 weeks ago. Symptoms have been unchanged since that time. The cough is productive of white sputum and is aggravated by fumes and reclining position. Associated symptoms include: shortness of breath, sputum production and wheezing. Patient has hx copd have a history of asthma. Patient does have a history of environmental allergens. Patient has not traveled recently. Patient does have a history of smoking. Patient has had a previous chest x-ray. Patient has not had a PPD done.  Pt also c/o neck pain for < than 1 week.   Pt is having increased pain. The following portions of the patient's history were reviewed and updated as appropriate: allergies, current medications, past family history, past medical history, past social history, past surgical history and problem list.  Review of Systems Pertinent items are noted in HPI.    Objective:    Oxygen saturation 95% on room air BP 148/78  Pulse 60  Temp(Src) 98.1 F (36.7 C) (Oral)  Wt 202 lb 9.6 oz (91.899 kg)  SpO2 95% General appearance: alert, cooperative, appears stated age and no distress Neck: no adenopathy, no carotid bruit, no JVD, supple, symmetrical, trachea midline and thyroid not enlarged, symmetric, no tenderness/mass/nodules Lungs: cma stated pt was wheezing and having trouble breathing on arrival -- neb given and BS were improved.  Still some wheezing Heart: S1, S2 normal Extremities: extremities normal, atraumatic, no cyanosis or edema   neck--  + tenderness base of scalp with palp b/l--pt is able to turn head and flex neck  Assessment:    Acute Bronchitis   neck pain--no known trauma-- ? If from coughing                          Flexeril and ultram,  Check xray secondary to severity of pain Plan:    Antibiotics per medication orders. Antitussives per medication orders. Avoid exposure to tobacco smoke and  fumes. B-agonist inhaler. Call if shortness of breath worsens, blood in sputum, change in character of cough, development of fever or chills, inability to maintain nutrition and hydration. Avoid exposure to tobacco smoke and fumes. Chest x-ray.

## 2011-06-10 ENCOUNTER — Ambulatory Visit (INDEPENDENT_AMBULATORY_CARE_PROVIDER_SITE_OTHER): Payer: Medicare PPO | Admitting: Family Medicine

## 2011-06-10 ENCOUNTER — Encounter: Payer: Self-pay | Admitting: Family Medicine

## 2011-06-10 DIAGNOSIS — Z23 Encounter for immunization: Secondary | ICD-10-CM

## 2011-06-10 DIAGNOSIS — I10 Essential (primary) hypertension: Secondary | ICD-10-CM

## 2011-06-10 DIAGNOSIS — E785 Hyperlipidemia, unspecified: Secondary | ICD-10-CM

## 2011-06-10 DIAGNOSIS — J449 Chronic obstructive pulmonary disease, unspecified: Secondary | ICD-10-CM

## 2011-06-10 DIAGNOSIS — J209 Acute bronchitis, unspecified: Secondary | ICD-10-CM

## 2011-06-10 DIAGNOSIS — M479 Spondylosis, unspecified: Secondary | ICD-10-CM

## 2011-06-10 LAB — BASIC METABOLIC PANEL
BUN: 33 mg/dL — ABNORMAL HIGH (ref 6–23)
CO2: 30 mEq/L (ref 19–32)
Chloride: 105 mEq/L (ref 96–112)
Creatinine, Ser: 1.3 mg/dL (ref 0.4–1.5)

## 2011-06-10 LAB — HEPATIC FUNCTION PANEL
Albumin: 3.8 g/dL (ref 3.5–5.2)
Alkaline Phosphatase: 79 U/L (ref 39–117)
Bilirubin, Direct: 0 mg/dL (ref 0.0–0.3)

## 2011-06-10 LAB — LIPID PANEL
LDL Cholesterol: 98 mg/dL (ref 0–99)
Total CHOL/HDL Ratio: 4

## 2011-06-10 NOTE — Progress Notes (Signed)
  Subjective:    Patient ID: Joe Jackson, male    DOB: 1929-11-12, 77 y.o.   MRN: [id number]  HPI Pt here to f/u from last visit.  Pt feeling much better with breathing and neck pain.   No other complaints.  His wife is present.   Review of Systems As above    Objective:   Physical Exam  Constitutional: He appears well-developed and well-nourished.  Cardiovascular: Normal rate and regular rhythm.   Pulmonary/Chest: Effort normal and breath sounds normal. No respiratory distress. He has no wheezes. He has no rales.  Psychiatric: He has a normal mood and affect. His behavior is normal. Judgment and thought content normal.          Assessment & Plan:

## 2011-06-10 NOTE — Assessment & Plan Note (Signed)
Pain controlled Use meds prn

## 2011-06-10 NOTE — Assessment & Plan Note (Signed)
Resolved----copd stable

## 2011-06-10 NOTE — Assessment & Plan Note (Signed)
Per pulm 

## 2011-06-10 NOTE — Progress Notes (Signed)
Addended by: Arnette Norris on: 06/10/2011 09:54 AM   Modules accepted: Orders

## 2011-06-10 NOTE — Patient Instructions (Signed)

## 2011-06-24 ENCOUNTER — Ambulatory Visit (INDEPENDENT_AMBULATORY_CARE_PROVIDER_SITE_OTHER): Payer: Medicare PPO | Admitting: Pulmonary Disease

## 2011-06-24 ENCOUNTER — Encounter: Payer: Self-pay | Admitting: Pulmonary Disease

## 2011-06-24 DIAGNOSIS — J84111 Idiopathic interstitial pneumonia, not otherwise specified: Secondary | ICD-10-CM

## 2011-06-24 DIAGNOSIS — J449 Chronic obstructive pulmonary disease, unspecified: Secondary | ICD-10-CM
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
## 2011-06-24 NOTE — Patient Instructions (Signed)
Stay on symbicort - refills will be sent Call us if you need help to quit smoking

## 2011-06-24 NOTE — Assessment & Plan Note (Signed)
Stable by history Vaccines uptodate Not interstd in pulm rehab

## 2011-06-24 NOTE — Assessment & Plan Note (Signed)
Although he does not have 'true ' airway obstruction, clinically flares respond to steroids & ABx Clearly smoking cessation paramount & most important intervention here, but he is not motivated

## 2011-08-20 ENCOUNTER — Ambulatory Visit (HOSPITAL_BASED_OUTPATIENT_CLINIC_OR_DEPARTMENT_OTHER)
Admission: RE | Admit: 2011-08-20 | Discharge: 2011-08-20 | Disposition: A | Discharge status: Home / Self Care | Payer: Medicare PPO | Source: Ambulatory Visit | Attending: Internal Medicine | Admitting: Internal Medicine

## 2011-08-20 ENCOUNTER — Ambulatory Visit (INDEPENDENT_AMBULATORY_CARE_PROVIDER_SITE_OTHER): Payer: Medicare PPO | Admitting: Internal Medicine

## 2011-08-20 ENCOUNTER — Encounter: Payer: Self-pay | Admitting: Internal Medicine

## 2011-08-20 VITALS — BP 148/82 | HR 74 | Temp 100.4°F | Wt 203.0 lb

## 2011-08-20 DIAGNOSIS — J441 Chronic obstructive pulmonary disease with (acute) exacerbation: Secondary | ICD-10-CM

## 2011-08-20 DIAGNOSIS — J449 Chronic obstructive pulmonary disease, unspecified: Secondary | ICD-10-CM | POA: Insufficient documentation

## 2011-08-20 DIAGNOSIS — I517 Cardiomegaly: Secondary | ICD-10-CM

## 2011-08-20 DIAGNOSIS — J4489 Other specified chronic obstructive pulmonary disease: Secondary | ICD-10-CM | POA: Insufficient documentation

## 2011-08-20 DIAGNOSIS — R079 Chest pain, unspecified: Secondary | ICD-10-CM | POA: Insufficient documentation

## 2011-08-20 MED ORDER — PREDNISONE 20 MG PO TABS: 20.0000 mg | ORAL_TABLET | Freq: Two times a day (BID) | ORAL | Status: AC

## 2011-08-20 MED ORDER — AZITHROMYCIN 250 MG PO TABS: ORAL_TABLET | ORAL | Status: AC

## 2011-08-20 NOTE — Progress Notes (Signed)
  Subjective:    Patient ID: Joe Jackson, male    DOB: [birth year], 78 y.o.   MRN: [id number]  HPI Respiratory tract infection Onset/symptoms:1/15 as coughing Exposures (illness/environmental/extrinsic):wife ill Progression of symptoms:worsening Treatments/response:Mucinex DM, Tussionex/ some benefit Present symptoms: Fever/chills/sweats:temp to 101.4; chills Frontal headache:no Facial pain:no Nasal purulence:no Sore throat:no Lymphadenopathy:no Wheezing/shortness of breath:some wheezing ; SOB is chronic (sees Dr Vassie Loll) Cough/sputum/hemoptysis:clear Pleuritic pain:no Smoking history:1/2 ppd           Review of Systems he has Duo neb which he is using in  his nebulizer 4 times a day and Symbicort which is used 2 puffs every 12 hours.     Objective:   Physical Exam General appearance:chronic ill  health ;but in no acute distress or increased work of breathing is present.  No  lymphadenopathy about the head, neck, or axilla noted.   Eyes: No conjunctival inflammation or lid edema is present.   Ears:  External ear exam shows no significant lesions or deformities.  Otoscopic examination reveals some wax bilaterally Nose:  External nasal examination shows no deformity or inflammation. Nasal mucosa are edematous  without lesions or exudates. No septal dislocation or deviation.No obstruction to airflow.   Oral exam: Dentures; lips and gums are healthy appearing.There is mild  oropharyngeal erythema w/o  exudate . Edematous changes of the uvula   Heart:  Normal rate and regular rhythm. S1 and S2 normal without gallop,  click, rub or other extra sounds. Brisk grade 1 systolic murmur at the base.  Lungs: Asymmetric wet rales greatest at the right lower lobe. No whispered pectoriloquy present. Scattered isolated musical pops and wheezes, low-grade. Extremities:  No cyanosis or  edema. Slight  clubbing  noted    Skin: Warm & dry           Assessment & Plan:    #1 COPD  exacerbation; rule out right lower lobe pneumonia  #2 nicotine addiction with consumption of half a pack a day  Plan: See orders/ recommendations

## 2011-08-20 NOTE — Patient Instructions (Addendum)
Order for x-rays entered into  the computer; these will be performed at [facility]. No appointment is necessary.  Please think about quitting smoking. Review the risks we discussed. Please call 1-800-QUIT-NOW (213-576-9135) for free smoking cessation counseling. NSAIDS ( Aleve, Advil, Naproxen) or Tylenol every 4 hrs as needed for fever as discussed based on label recommendations

## 2011-10-17 ENCOUNTER — Encounter: Payer: Self-pay | Admitting: Family Medicine

## 2011-10-17 ENCOUNTER — Ambulatory Visit (INDEPENDENT_AMBULATORY_CARE_PROVIDER_SITE_OTHER): Payer: Medicare PPO | Admitting: Family Medicine

## 2011-10-17 VITALS — BP 127/90 | HR 87 | Temp 101.2°F | Ht 70.0 in | Wt 202.4 lb

## 2011-10-17 DIAGNOSIS — R1110 Vomiting, unspecified: Secondary | ICD-10-CM

## 2011-10-17 MED ORDER — ONDANSETRON 4 MG PO TBDP: 4.0000 mg | ORAL_TABLET | Freq: Three times a day (TID) | ORAL | Status: AC | PRN

## 2011-10-17 MED ORDER — OSELTAMIVIR PHOSPHATE 75 MG PO CAPS: 75.0000 mg | ORAL_CAPSULE | Freq: Two times a day (BID) | ORAL | Status: AC

## 2011-10-17 NOTE — Progress Notes (Signed)
  Subjective:    Patient ID: Joe Jackson, male    DOB: [birth year], 78 y.o.   MRN: [id number]  HPI Vomiting- sxs started last night w/ chills.  Vomited 2-3x this morning.  Wife called EMS- they arrived and BP was '190 over something'.  Pt refused EMS transport.  No increased cough or SOB.  No diarrhea.  No abd pain.  No known sick contacts.  + body aches.   Review of Systems For ROS see HPI     Objective:   Physical Exam  Vitals reviewed. Constitutional: He appears well-developed and well-nourished.       Pale, sitting in wheel chair  HENT:  Head: Normocephalic and atraumatic.  Nose: Nose normal.  Mouth/Throat: Oropharynx is clear and moist. No oropharyngeal exudate.       TMs normal bilaterally No TTP over sinuses MMM- drinking water during exam  Neck: Neck supple.  Cardiovascular: Normal rate, regular rhythm and normal heart sounds.   Pulmonary/Chest: Effort normal and breath sounds normal. No respiratory distress. He has no wheezes. He has no rales.  Abdominal: Soft. Bowel sounds are normal. He exhibits no distension. There is no tenderness. There is no rebound and no guarding.  Lymphadenopathy:    He has no cervical adenopathy.  Skin: Skin is warm and dry.          Assessment & Plan:

## 2011-10-17 NOTE — Patient Instructions (Signed)
Make sure you are drinking plenty of fluids Use the Zofran as needed for nausea REST! Start the Tamiflu twice daily Alternate tylenol and ibuprofen every 4 hrs for fever and body aches Call with any questions or concerns If your symptoms change or worsen- please go to the ER Hang in there!

## 2011-10-26 NOTE — Assessment & Plan Note (Signed)
New.  Most likely viral but due to body aches will start Tamiflu.  Encouraged fluids.  Alternate tylenol and ibuprofen for fever and body aches.  Zofran prn.  Reviewed that BP elevation immediately after vomiting was likely due to physical stress.  BP normal in office.  Reviewed supportive care and red flags that should prompt return.  Pt expressed understanding and is in agreement w/ plan.

## 2011-11-09 ENCOUNTER — Emergency Department (HOSPITAL_COMMUNITY)
Admission: EM | Admit: 2011-11-09 | Discharge: 2011-11-09 | Disposition: A | Discharge status: Home / Self Care | Payer: Medicare PPO | Attending: Emergency Medicine | Admitting: Emergency Medicine

## 2011-11-09 ENCOUNTER — Emergency Department (HOSPITAL_COMMUNITY): Payer: Medicare PPO

## 2011-11-09 ENCOUNTER — Encounter (HOSPITAL_COMMUNITY): Payer: Self-pay | Admitting: *Deleted

## 2011-11-09 DIAGNOSIS — IMO0001 Reserved for inherently not codable concepts without codable children: Secondary | ICD-10-CM

## 2011-11-09 DIAGNOSIS — Z79899 Other long term (current) drug therapy: Secondary | ICD-10-CM | POA: Insufficient documentation

## 2011-11-09 DIAGNOSIS — J4489 Other specified chronic obstructive pulmonary disease: Secondary | ICD-10-CM | POA: Insufficient documentation

## 2011-11-09 DIAGNOSIS — I2510 Atherosclerotic heart disease of native coronary artery without angina pectoris: Secondary | ICD-10-CM | POA: Insufficient documentation

## 2011-11-09 DIAGNOSIS — I10 Essential (primary) hypertension: Secondary | ICD-10-CM | POA: Insufficient documentation

## 2011-11-09 DIAGNOSIS — R059 Cough, unspecified: Secondary | ICD-10-CM | POA: Insufficient documentation

## 2011-11-09 DIAGNOSIS — R05 Cough: Secondary | ICD-10-CM

## 2011-11-09 DIAGNOSIS — J449 Chronic obstructive pulmonary disease, unspecified: Secondary | ICD-10-CM | POA: Insufficient documentation

## 2011-11-09 DIAGNOSIS — I509 Heart failure, unspecified: Secondary | ICD-10-CM | POA: Insufficient documentation

## 2011-11-09 DIAGNOSIS — E785 Hyperlipidemia, unspecified: Secondary | ICD-10-CM | POA: Insufficient documentation

## 2011-11-09 DIAGNOSIS — R062 Wheezing: Secondary | ICD-10-CM | POA: Insufficient documentation

## 2011-11-09 LAB — BASIC METABOLIC PANEL
BUN: 22 mg/dL (ref 6–23)
CO2: 27 mEq/L (ref 19–32)
Chloride: 103 mEq/L (ref 96–112)
GFR calc non Af Amer: 64 mL/min — ABNORMAL LOW (ref >90)
Glucose, Bld: 90 mg/dL (ref 70–99)
Potassium: 3.9 mEq/L (ref 3.5–5.1)

## 2011-11-09 LAB — DIFFERENTIAL
Eosinophils Absolute: 1.5 10*3/uL — ABNORMAL HIGH (ref 0.0–0.7)
Lymphocytes Relative: 24 % (ref 12–46)
Lymphs Abs: 2 10*3/uL (ref 0.7–4.0)
Monocytes Relative: 8 % (ref 3–12)
Neutrophils Relative %: 49 % (ref 43–77)

## 2011-11-09 LAB — CBC
Hemoglobin: 12.6 g/dL — ABNORMAL LOW (ref 13.0–17.0)
MCH: 26.9 pg (ref 26.0–34.0)
RBC: 4.68 MIL/uL (ref 4.22–5.81)

## 2011-11-09 MED ORDER — PREDNISONE 20 MG PO TABS
60.0000 mg | ORAL_TABLET | Freq: Once | ORAL | Status: AC
  Administered 2011-11-09: 60 mg via ORAL
  Filled 2011-11-09: qty 3

## 2011-11-09 MED ORDER — PREDNISONE 50 MG PO TABS: 50.0000 mg | ORAL_TABLET | Freq: Every day | ORAL | Status: AC

## 2011-11-09 MED ORDER — AZITHROMYCIN 250 MG PO TABS: ORAL_TABLET | ORAL | Status: AC

## 2011-11-09 NOTE — ED Provider Notes (Signed)
The patient has diffuse mild wheezing but feels stable for discharge and room air pulse oximetry is normal.  I saw and evaluated the patient, reviewed the resident's note and I agree with the findings and plan.  Hurman Horn, MD 11/10/11 2236

## 2011-11-09 NOTE — ED Notes (Signed)
Pt sounds congested and states he has been sick for the past 2-3 days.  Pt did have some sputum that he was coughing up but has decreased.  Pt reports coughing spells.  Pt denies fever.

## 2011-11-09 NOTE — ED Provider Notes (Signed)
History     CSN: [id number]  Arrival date & time 11/09/11  1229   First MD Initiated Contact with Patient 11/09/11 1539      Chief Complaint  Patient presents with  . Shortness of Breath    (Consider location/radiation/quality/duration/timing/severity/associated sxs/prior treatment) HPI Pt here with 3-4 weeks of cough, sob, wheezing associated with mild fatigue.  Pt has copd and takes duonebs and symbicort at home which help with the sx's but he had increased sob last night.  He has not been on any steroids recently.  Sx's mild to moderate.  No associated fever, chills ofr pain.    Past Medical History  Diagnosis Date  . CHF (congestive heart failure)   . Hypertension   . Hyperlipidemia   . Coronary heart disease     ish CMP 45%, aortic stenosis 1.0 cm 211/08  . Sleep apnea   . Aortic aneurysm, abdominal   . Pulmonary fibrosis   . CHF (congestive heart failure)     Past Surgical History  Procedure Date  . Neck surgery   . Arm surgery   . Back surgery   . Leg surgery     Family History  Problem Relation Age of Onset  . Heart disease Mother   . Other Mother     Rheumatism    History  Substance Use Topics  . Smoking status: Current Everyday Smoker -- 0.8 packs/day for 78 years    Types: Cigarettes  . Smokeless tobacco: Former Neurosurgeon    Types: Chew    Quit date: 08/11/2011  . Alcohol Use: No      Review of Systems  Constitutional: Positive for fatigue. Negative for fever and chills.  Respiratory: Positive for cough, shortness of breath and wheezing. Negative for chest tightness.   Cardiovascular: Negative for chest pain, palpitations and leg swelling.  Gastrointestinal: Negative for nausea, vomiting, abdominal pain and diarrhea.  All other systems reviewed and are negative.    Allergies  Review of patient's allergies indicates no known allergies.  Home Medications   Current Outpatient Rx  Name Route Sig Dispense Refill  . ASPIRIN EC 81 MG PO TBEC  Oral Take 81 mg by mouth daily.    . ATORVASTATIN CALCIUM 40 MG PO TABS Oral Take 20 mg by mouth at bedtime.    . BUDESONIDE-FORMOTEROL FUMARATE 160-4.5 MCG/ACT IN AERO Inhalation Inhale 2 puffs into the lungs 2 (two) times daily.      Marland Kitchen CETIRIZINE HCL 10 MG PO TABS Oral Take 10 mg by mouth daily.      . CO Q 10 PO Oral Take 1 tablet by mouth daily.     Marland Kitchen VITAMIN B-12 PO Oral Take 1 tablet by mouth daily.     Marland Kitchen GARLIC 100 MG PO TABS Oral Take 1 tablet by mouth daily.      . GUAIFENESIN ER 600 MG PO TB12 Oral Take 600 mg by mouth daily.      . IPRATROPIUM-ALBUTEROL 0.5-2.5 (3) MG/3ML IN SOLN Nebulization Take 3 mLs by nebulization 4 (four) times daily.     Marland Kitchen LISINOPRIL-HYDROCHLOROTHIAZIDE 20-12.5 MG PO TABS Oral Take 2 tablets by mouth daily.     . ADULT MULTIVITAMIN W/MINERALS CH Oral Take 1 tablet by mouth daily.    Marland Kitchen FISH OIL PO Oral Take 1 capsule by mouth daily.     . AZITHROMYCIN 250 MG PO TABS  2 po day one, then 1 daily x 4 days 5 tablet 0  . PREDNISONE 50  MG PO TABS Oral Take 1 tablet (50 mg total) by mouth daily. 5 tablet 0    BP 144/65  Pulse 69  Temp(Src) 98.2 F (36.8 C) (Oral)  Resp 24  SpO2 97%RA wnl  Physical Exam  Nursing note and vitals reviewed. Constitutional: He appears well-developed and well-nourished.  HENT:  Head: Normocephalic and atraumatic.  Eyes: Right eye exhibits no discharge. Left eye exhibits no discharge.  Neck: Normal range of motion. Neck supple.  Cardiovascular: Normal rate, regular rhythm and normal heart sounds.   Pulmonary/Chest: Effort normal. No respiratory distress. He has wheezes. He has no rales.  Abdominal: Soft. There is no tenderness.  Musculoskeletal: Normal range of motion. He exhibits no tenderness.  Neurological: He is alert.  Skin: Skin is warm and dry.  Psychiatric: He has a normal mood and affect. His behavior is normal.    ED Course  Procedures (including critical care time)  Labs Reviewed  CBC - Abnormal; Notable for  the following:    Hemoglobin 12.6 (*)    Platelets 133 (*)    All other components within normal limits  DIFFERENTIAL - Abnormal; Notable for the following:    Eosinophils Relative 19 (*)    Eosinophils Absolute 1.5 (*)    All other components within normal limits  BASIC METABOLIC PANEL - Abnormal; Notable for the following:    GFR calc non Af Amer 64 (*)    GFR calc Af Amer 74 (*)    All other components within normal limits  PRO B NATRIURETIC PEPTIDE   Dg Chest 2 View  11/09/2011  *RADIOLOGY REPORT*  Clinical Data: Shortness of breath.  CHEST - 2 VIEW  Comparison: 08/20/2011 and 05/27/2011  Findings:  Mild cardiomegaly.  Pulmonary vascularity is normal.  Chronic interstitial accentuation diffusely.  No acute infiltrates or effusions.  No acute osseous abnormality.  IMPRESSION: Extensive chronic lung disease.  No acute abnormalities.  Original Report Authenticated By: Gwynn Burly, M.D.     1. COPD bronchitis   2. Wheezing   3. Cough       MDM  Pt is in nad, afvss, nontoxic appearing, exam and hx as above.  Pt appears comfortable, speaking in full sentences.  No use of accessory muscles of respiration.  Has mild wheezing on exam.  Has been taking his duonebs and symbicort with some relief.  CXR shows no definitive opacities.  Labs unremarkable.  Doubt chf exacerbation.  Will start on prednisone here.  D/c with z pak for bronchitis.  Return warnings given.        Elijio Miles, MD 11/09/11 2009

## 2011-11-09 NOTE — Discharge Instructions (Signed)
Bronchitis Bronchitis is a problem of the air tubes leading to your lungs. This problem makes it hard for air to get in and out of the lungs. You may cough a lot because your air tubes are narrow. Going without care can cause lasting (chronic) bronchitis. HOME CARE   Drink enough fluids to keep your pee (urine) clear or pale yellow.   Use a cool mist humidifier.   Quit smoking if you smoke. If you keep smoking, the bronchitis might not get better.   Only take medicine as told by your doctor.  GET HELP RIGHT AWAY IF:   Coughing keeps you awake.   You start to wheeze.   You become more sick or weak.   You have a hard time breathing or get short of breath.   You cough up blood.   Coughing lasts more than 2 weeks.   You have a fever.   Your baby is older than 3 months with a rectal temperature of 102 F (38.9 C) or higher.   Your baby is 28 months old or younger with a rectal temperature of 100.4 F (38 C) or higher.  MAKE SURE YOU:  Understand these instructions.   Will watch your condition.   Will get help right away if you are not doing well or get worse.  Document Released: 01/07/2008 Document Revised: 07/10/2011 Document Reviewed: 06/22/2009 [facility] Patient Information 2012 [place], [place].  Using Your Inhaler 1. Take the cap off the mouthpiece.  2. Shake the inhaler for 5 seconds.  3. Turn the inhaler so the bottle is above the mouthpiece. Hold it away from your mouth, at a distance of the width of 2 fingers.  4. Open your mouth widely, and tilt your head back slightly. Let your breath out.  5. Take a deep breath in slowly through your mouth. At the same time, push down on the bottle 1 time. You will feel the medicine enter your mouth and throat as you breathe.  6. Continue to take a deep breath in very slowly.  7. After you have breathed in completely, hold your breath for 10 seconds. This will help the medicine to settle in your lungs. If you cannot hold your  breath for 10 seconds, hold it for as long as you can before you breathe out.  8. If your doctor has told you to take more than 1 puff, wait at least 1 minute between puffs. This will help you get the best results from your medicine.  9. If you use a steroid inhaler, rinse out your mouth after each dose.  10. Wash your inhaler once a day. Remove the bottle from the mouthpiece. Rinse the mouthpiece and cap with warm water. Dry everything well before you put the inhaler back together.  Document Released: 04/29/2008 Document Revised: 07/10/2011 Document Reviewed: 05/08/2009 [facility] Patient Information 2012 [place], [place].  Marland KitchenUsing Your Inhaler 11. Take the cap off the mouthpiece.  12. Shake the inhaler for 5 seconds.  13. Turn the inhaler so the bottle is above the mouthpiece. Hold it away from your mouth, at a distance of the width of 2 fingers.  14. Open your mouth widely, and tilt your head back slightly. Let your breath out.  15. Take a deep breath in slowly through your mouth. At the same time, push down on the bottle 1 time. You will feel the medicine enter your mouth and throat as you breathe.  16. Continue to take a deep breath in very slowly.  17. After you have breathed in completely, hold your breath for 10 seconds. This will help the medicine to settle in your lungs. If you cannot hold your breath for 10 seconds, hold it for as long as you can before you breathe out.  18. If your doctor has told you to take more than 1 puff, wait at least 1 minute between puffs. This will help you get the best results from your medicine.  19. If you use a steroid inhaler, rinse out your mouth after each dose.  20. Wash your inhaler once a day. Remove the bottle from the mouthpiece. Rinse the mouthpiece and cap with warm water. Dry everything well before you put the inhaler back together.  Document Released: 04/29/2008 Document Revised: 07/10/2011 Document Reviewed: 05/08/2009 [employer] Patient  Information 2012 [employer].  TAKE 2 PUFFS FROM YOUR INHALER EVERY 2 HOURS FOR THE NEXT 24 HOURS.  THEN TAKE 2 PUFFS EVERY 4 HOURS FOR THE NEXT 24 HOURS AFTER THAT.  Return for any new or worsening symptoms or any other concerns.

## 2011-11-10 ENCOUNTER — Telehealth: Payer: Self-pay | Admitting: *Deleted

## 2011-11-10 NOTE — Telephone Encounter (Signed)
[facility] Triage Call Report Triage Record Num: [id number] Operator: April Finney Patient Name: Joe Jackson Call Date & Time: 11/09/2011 10:14:23AM Patient Phone: [phone] PCP: Lelon Perla Patient Gender: Male PCP Fax : [phone] Patient DOB: 1930/05/24 Practice Name: [facility] Reason for Call: Caller: [name]/Patient; PCP: Lelon Perla.; CB#: [phone]; Call regarding Cough/Congestion; Having wheezing and coughing. Having episodes of sob but not at this time. Wife/[name] feels he is getting worse. Care advice given. Protocol(s) Used: Asthma - Adult Recommended Outcome per Protocol: See Provider within 4 hours Reason for Outcome: Having asthma symptoms that have not improved or are worsening after following provider's instructions (quick relief medicine does not relieve symptoms). Care Advice: ~ Call provider if symptoms worsen or new symptoms develop. ~ Avoid any activity that produces symptoms until evaluated by provider. ~ Rescue medication at recommended dose can be used every 20 minutes up to 3 doses. ~ SYMPTOM / CONDITION MANAGEMENT ~ CAUTIONS ~ List, or take, all current prescription(s), nonprescription or alternative medication(s) to provider for evaluation. Tell your provider if you: - Have been hospitalized or had ED care for your asthma in past month; - Have had 2 or more hospitalizations OR 3 or more ED visits for your asthma in past year; - Have used more than 2 canisters of rescue medication in past month; - Are currently taking or have had recent withdrawal of systemic corticosteroids; - Have another chronic illness in addition to asthma; - Are having serious problems coping with psychosocial issues. ~ 11/09/2011 10:29:07AM

## 2011-11-10 NOTE — Telephone Encounter (Signed)
Pt should have appoint either her or with pulm

## 2011-11-10 NOTE — Telephone Encounter (Signed)
Patient stated he went to an Urgent Care yesterday and he feels better today. He declined an apt at this time.    KP

## 2012-02-24 ENCOUNTER — Encounter: Payer: Self-pay | Admitting: Neurosurgery

## 2012-02-25 ENCOUNTER — Ambulatory Visit (INDEPENDENT_AMBULATORY_CARE_PROVIDER_SITE_OTHER): Payer: Medicare PPO | Admitting: *Deleted

## 2012-02-25 ENCOUNTER — Ambulatory Visit (INDEPENDENT_AMBULATORY_CARE_PROVIDER_SITE_OTHER): Payer: Medicare PPO | Admitting: Neurosurgery

## 2012-02-25 ENCOUNTER — Encounter: Payer: Self-pay | Admitting: Neurosurgery

## 2012-02-25 VITALS — BP 149/74 | HR 48 | Resp 14 | Ht 70.0 in | Wt 201.9 lb

## 2012-02-25 DIAGNOSIS — I714 Abdominal aortic aneurysm, without rupture: Secondary | ICD-10-CM

## 2012-02-25 NOTE — Addendum Note (Signed)
Addended by: Sharee Pimple on: 02/25/2012 11:04 AM   Modules accepted: Orders

## 2012-02-25 NOTE — Progress Notes (Signed)
[facility] Office Note  CC: Annual AAA duplex Referring Physician: Edilia Bo  History of Present Illness: 78 year old male patient of Dr. Edilia Bo followed for known AAA. The patient has a history of having vein work done by Dr. Hart Rochester that is followed by Dr. Edilia Bo for his AAA. The patient denies any unusual abdominal or back pain. Patient also denies any new medical diagnoses or recent surgeries.  Past Medical History  Diagnosis Date  . CHF (congestive heart failure)   . Hypertension   . Hyperlipidemia   . Coronary heart disease     ish CMP 45%, aortic stenosis 1.0 cm 211/08  . Sleep apnea   . Aortic aneurysm, abdominal   . Pulmonary fibrosis   . CHF (congestive heart failure)   . Myocardial infarction   . COPD (chronic obstructive pulmonary disease)     ROS: [x]  Positive   [ ]  Denies    General: [ ]  Weight loss, [ ]  Fever, [ ]  chills Neurologic: [ ]  Dizziness, [ ]  Blackouts, [ ]  Seizure [ ]  Stroke, [ ]  "Mini stroke", [ ]  Slurred speech, [ ]  Temporary blindness; [ ]  weakness in arms or legs, [ ]  Hoarseness Cardiac: [ ]  Chest pain/pressure, [ ]  Shortness of breath at rest [ ]  Shortness of breath with exertion, [ ]  Atrial fibrillation or irregular heartbeat Vascular: [ ]  Pain in legs with walking, [ ]  Pain in legs at rest, [ ]  Pain in legs at night,  [ ]  Non-healing ulcer, [ ]  Blood clot in vein/DVT,   Pulmonary: [ ]  Home oxygen, [ ]  Productive cough, [ ]  Coughing up blood, [ ]  Asthma,  [ ]  Wheezing Musculoskeletal:  [ ]  Arthritis, [ ]  Low back pain, [ ]  Joint pain Hematologic: [ ]  Easy Bruising, [ ]  Anemia; [ ]  Hepatitis Gastrointestinal: [ ]  Blood in stool, [ ]  Gastroesophageal Reflux/heartburn, [ ]  Trouble swallowing Urinary: [ ]  chronic Kidney disease, [ ]  on HD - [ ]  MWF or [ ]  TTHS, [ ]  Burning with urination, [ ]  Difficulty urinating Skin: [ ]  Rashes, [ ]  Wounds Psychological: [ ]  Anxiety, [ ]  Depression   Social  History History  Substance Use Topics  . Smoking status: Current Everyday Smoker -- 0.8 packs/day for 78 years    Types: Cigarettes  . Smokeless tobacco: Former Neurosurgeon    Types: Chew    Quit date: 08/11/2011  . Alcohol Use: No    Family History Family History  Problem Relation Age of Onset  . Heart disease Mother     Heart Disease before age  40  . Other Mother     Rheumatism  . Heart attack Mother   . Heart attack Father     No Known Allergies  Current Outpatient Prescriptions  Medication Sig Dispense Refill  . aspirin EC 81 MG tablet Take 81 mg by mouth daily.      Marland Kitchen atorvastatin (LIPITOR) 40 MG tablet Take 20 mg by mouth at bedtime.      . budesonide-formoterol (SYMBICORT) 160-4.5 MCG/ACT inhaler Inhale 2 puffs into the lungs 2 (two) times daily.        . cetirizine (ZYRTEC) 10 MG tablet Take 10 mg by mouth daily.        . Coenzyme Q10 (CO Q 10 PO) Take 1 tablet by mouth daily.       . Cyanocobalamin (VITAMIN B-12 PO) Take 1 tablet by mouth daily.       . Garlic (  ODOR FREE GARLIC) 100 MG TABS Take 1 tablet by mouth daily.        Marland Kitchen guaiFENesin (MUCINEX) 600 MG 12 hr tablet Take 600 mg by mouth daily.        Marland Kitchen ipratropium-albuterol (DUONEB) 0.5-2.5 (3) MG/3ML SOLN Take 3 mLs by nebulization 4 (four) times daily.       Marland Kitchen lisinopril-hydrochlorothiazide (PRINZIDE,ZESTORETIC) 20-12.5 MG per tablet Take 2 tablets by mouth daily.       . Multiple Vitamin (MULITIVITAMIN WITH MINERALS) TABS Take 1 tablet by mouth daily.      . Omega-3 Fatty Acids (FISH OIL PO) Take 1 capsule by mouth daily.         Physical Examination  Filed Vitals:   02/25/12 0950  BP: 149/74  Pulse: 48  Resp: 14    Body mass index is 28.97 kg/(m^2).  General:  WDWN in NAD Gait: Normal HEENT: WNL Eyes: Pupils equal Pulmonary: normal non-labored breathing , without Rales, rhonchi,  wheezing Cardiac: RRR, without  Murmurs, rubs or gallops; No carotid bruits Abdomen: soft, NT, no masses Skin: no  rashes, ulcers noted Vascular Exam/Pulses: Palpable lower stream a pulses, no abdominal mass is palpated due to body habitus  Extremities without ischemic changes, no Gangrene , no cellulitis; no open wounds;  Musculoskeletal: no muscle wasting or atrophy  Neurologic: A&O X 3; Appropriate Affect ; SENSATION: normal; MOTOR FUNCTION:  moving all extremities equally. Speech is fluent/normal  Non-Invasive Vascular Imaging: Maximum measurement today is 2.63 cm distally, previous AAA duplex July 2012 measured 2.96 AP, 3.22 transverse  ASSESSMENT/PLAN: Asymptomatic patient with known small AAA, he will followup in one year with repeat AAA duplex. His questions were encouraged and answered, he is in agreement with this plan.  Lauree Chandler ANP  Clinic M.D.: Edilia Bo

## 2012-03-03 NOTE — Procedures (Unsigned)
DUPLEX ULTRASOUND OF ABDOMINAL AORTA  INDICATION:  Followup AAA.  HISTORY: Diabetes:  No Cardiac:  MI, CHF. 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 Aneurysmal dilatation of the mid to distal abdominal aorta measuring 2.63 cm. Extremely limited visualization due to overlying bowel gas and patient body habitus.  ___________________________________________ Di Kindle. Edilia Bo, M.D.  EM/MEDQ  D:  02/25/2012  T:  02/25/2012  Job:  [id number]

## 2012-03-30 ENCOUNTER — Telehealth: Payer: Self-pay | Admitting: Family Medicine

## 2012-03-30 ENCOUNTER — Ambulatory Visit (INDEPENDENT_AMBULATORY_CARE_PROVIDER_SITE_OTHER): Payer: Medicare PPO | Admitting: Family Medicine

## 2012-03-30 ENCOUNTER — Encounter: Payer: Self-pay | Admitting: Family Medicine

## 2012-03-30 VITALS — BP 140/70 | HR 65 | Temp 98.0°F | Ht 66.75 in | Wt 260.2 lb

## 2012-03-30 DIAGNOSIS — J449 Chronic obstructive pulmonary disease, unspecified: Secondary | ICD-10-CM

## 2012-03-30 MED ORDER — IPRATROPIUM-ALBUTEROL 0.5-2.5 (3) MG/3ML IN SOLN
3.0000 mL | Freq: Once | RESPIRATORY_TRACT | Status: AC
  Administered 2012-03-30: 3 mL via RESPIRATORY_TRACT

## 2012-03-30 MED ORDER — PREDNISONE 10 MG PO TABS: ORAL_TABLET | ORAL | Status: DC

## 2012-03-30 MED ORDER — METHYLPREDNISOLONE ACETATE 80 MG/ML IJ SUSP
80.0000 mg | Freq: Once | INTRAMUSCULAR | Status: AC
  Administered 2012-03-30: 80 mg via INTRAMUSCULAR

## 2012-03-30 NOTE — Patient Instructions (Signed)

## 2012-03-30 NOTE — Progress Notes (Signed)
  Subjective:    Joe Jackson is a 78 y.o. male with known COPD who presents with exacerbation. Current symptoms include: acute dyspnea, cough productive of clear sputum in small amounts and wheezing. Symptoms began a few days ago. Patient uses 1 pillows at night. Patient currently is not on home oxygen therapy..   The following portions of the patient's history were reviewed and updated as appropriate: allergies, current medications, past family history, past medical history, past social history, past surgical history and problem list.  Review of Systems Pertinent items are noted in HPI.     Objective:    BP 140/70  Pulse 65  Temp 98 F (36.7 C) (Oral)  Ht 5' 6.75" (1.695 m)  Wt 260 lb 3.2 oz (118.026 kg)  BMI 41.06 kg/m2  SpO2 94% Oxygen saturation 94% on room air BP 140/70  Pulse 65  Temp 98 F (36.7 C) (Oral)  Ht 5' 6.75" (1.695 m)  Wt 260 lb 3.2 oz (118.026 kg)  BMI 41.06 kg/m2  SpO2 94% General appearance: alert, cooperative, appears stated age and mild distress Lungs: wheezes bilaterally---improved after duoneb but not completely clear Heart: S1, S2 normal Extremities: extremities normal, atraumatic, no cyanosis or edema     Assessment:    Acute exacerbation of chronic bronchitis    Plan:    Discussed diagnosis, its evaluation, treatment and usual course. All questions answered. Steroid burst per orders. f/u pulmonary  con't other meds

## 2012-03-30 NOTE — Telephone Encounter (Signed)
Caller: Rosa/Spouse; Patient Name: Joe Jackson; PCP: Lelon Perla.; Best Callback Phone Number: [phone]. Call regarding: fatigue, increase in coughing with clear sputum. Onset 03/15/12. Afebrile. Patient has been taking Mucinex. Has been doing breathing treatments around the clock for 4-5 days. Emergent symptom of "New or worsening cough and known cardiac or respiratory condition" positive per Cough - Adult guideline. Disposition: See Provider within 4 hours. Appointment scheduled at 11:15am 03/30/12 with Dr. Laury Axon. Instructed wife to call back if any new symptoms develop or symptoms worsen before appointment.

## 2012-04-06 ENCOUNTER — Ambulatory Visit (INDEPENDENT_AMBULATORY_CARE_PROVIDER_SITE_OTHER): Payer: Medicare PPO | Admitting: Pulmonary Disease

## 2012-04-06 ENCOUNTER — Encounter: Payer: Self-pay | Admitting: Pulmonary Disease

## 2012-04-06 VITALS — BP 130/60 | HR 65 | Temp 98.5°F | Ht 66.5 in | Wt 201.0 lb

## 2012-04-06 DIAGNOSIS — J84111 Idiopathic interstitial pneumonia, not otherwise specified: Secondary | ICD-10-CM

## 2012-04-06 DIAGNOSIS — J449 Chronic obstructive pulmonary disease, unspecified: Secondary | ICD-10-CM
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
## 2012-04-06 NOTE — Patient Instructions (Signed)
Take symbicort 2 puffs twice daily -sample Take nebuliser RX twice daily & in between as needed only Take flu shot

## 2012-04-06 NOTE — Assessment & Plan Note (Signed)
Although he does not have 'true ' airway obstruction, clinically flares respond to steroids & ABx PFTs- 1/09 FVC 60%, ratio 81 - no airway obstructoin! Rpt spirometry 8/13 stable Imaging -No active alveolitis>>Doubt IPF , could be post inflammatory or related to old granbulomatous dz.   Acute flare seems to have resolved with steroids - no further doses needed Stay on symbicort -encouraged compliance OK to cut down on albuterol nebs He is not interested in pulm rehab

## 2012-04-14 ENCOUNTER — Encounter: Payer: Self-pay | Admitting: Family Medicine

## 2012-04-14 ENCOUNTER — Ambulatory Visit (INDEPENDENT_AMBULATORY_CARE_PROVIDER_SITE_OTHER): Payer: Medicare PPO | Admitting: Family Medicine

## 2012-04-14 ENCOUNTER — Telehealth: Payer: Self-pay | Admitting: Family Medicine

## 2012-04-14 VITALS — BP 140/62 | HR 64 | Temp 98.2°F | Ht 66.5 in | Wt 205.4 lb

## 2012-04-14 DIAGNOSIS — E785 Hyperlipidemia, unspecified: Secondary | ICD-10-CM

## 2012-04-14 DIAGNOSIS — Z0000 Encounter for general adult medical examination without abnormal findings: Secondary | ICD-10-CM

## 2012-04-14 DIAGNOSIS — Z23 Encounter for immunization: Secondary | ICD-10-CM

## 2012-04-14 DIAGNOSIS — N529 Male erectile dysfunction, unspecified: Secondary | ICD-10-CM

## 2012-04-14 DIAGNOSIS — I10 Essential (primary) hypertension: Secondary | ICD-10-CM

## 2012-04-14 DIAGNOSIS — Z125 Encounter for screening for malignant neoplasm of prostate: Secondary | ICD-10-CM

## 2012-04-14 DIAGNOSIS — I2510 Atherosclerotic heart disease of native coronary artery without angina pectoris: Secondary | ICD-10-CM

## 2012-04-14 LAB — CBC WITH DIFFERENTIAL/PLATELET
Eosinophils Absolute: 1.3 10*3/uL — ABNORMAL HIGH (ref 0.0–0.7)
Eosinophils Relative: 12.3 % — ABNORMAL HIGH (ref 0.0–5.0)
MCHC: 31.9 g/dL (ref 30.0–36.0)
MCV: 85.6 fl (ref 78.0–100.0)
Monocytes Absolute: 0.8 10*3/uL (ref 0.1–1.0)
Neutrophils Relative %: 58.8 % (ref 43.0–77.0)
Platelets: 86 10*3/uL — ABNORMAL LOW (ref 150.0–400.0)
WBC: 10.3 10*3/uL (ref 4.5–10.5)

## 2012-04-14 LAB — POCT URINALYSIS DIPSTICK
Leukocytes, UA: NEGATIVE
Nitrite, UA: NEGATIVE
Protein, UA: NEGATIVE
Urobilinogen, UA: 0.2
pH, UA: 6.5

## 2012-04-14 MED ORDER — LISINOPRIL-HYDROCHLOROTHIAZIDE 20-12.5 MG PO TABS: 2.0000 | ORAL_TABLET | Freq: Every day | ORAL | Status: DC

## 2012-04-14 MED ORDER — NONFORMULARY OR COMPOUNDED ITEM: Status: DC

## 2012-04-14 MED ORDER — ZOSTER VACCINE LIVE 19400 UNT/0.65ML ~~LOC~~ SOLR: 0.6500 mL | Freq: Once | SUBCUTANEOUS | Status: AC

## 2012-04-14 MED ORDER — BUDESONIDE-FORMOTEROL FUMARATE 160-4.5 MCG/ACT IN AERO: 2.0000 | INHALATION_SPRAY | Freq: Two times a day (BID) | RESPIRATORY_TRACT | Status: DC

## 2012-04-14 MED ORDER — ATORVASTATIN CALCIUM 40 MG PO TABS: 20.0000 mg | ORAL_TABLET | Freq: Every day | ORAL | Status: DC

## 2012-04-14 NOTE — Assessment & Plan Note (Signed)
Check labs 

## 2012-04-14 NOTE — Patient Instructions (Signed)
Preventive Care for Adults, Male A healthy lifestyle and preventative care can promote health and wellness. Preventative health guidelines for men include the following key practices:  A routine yearly physical is a good way to check with your caregiver about your health and preventative screening. It is a chance to share any concerns and updates on your health, and to receive a thorough exam.   Visit your dentist for a routine exam and preventative care every 6 months. Brush your teeth twice a day and floss once a day. Good oral hygiene prevents tooth decay and gum disease.   The frequency of eye exams is based on your age, health, family medical history, use of contact lenses, and other factors. Follow your caregiver's recommendations for frequency of eye exams.   Eat a healthy diet. Foods like vegetables, fruits, whole grains, low-fat dairy products, and lean protein foods contain the nutrients you need without too many calories. Decrease your intake of foods high in solid fats, added sugars, and salt. Eat the right amount of calories for you.Get information about a proper diet from your caregiver, if necessary.   Regular physical exercise is one of the most important things you can do for your health. Most adults should get at least 150 minutes of moderate-intensity exercise (any activity that increases your heart rate and causes you to sweat) each week. In addition, most adults need muscle-strengthening exercises on 2 or more days a week.   Maintain a healthy weight. The body mass index (BMI) is a screening tool to identify possible weight problems. It provides an estimate of body fat based on height and weight. Your caregiver can help determine your BMI, and can help you achieve or maintain a healthy weight.For adults 20 years and older:   A BMI below 18.5 is considered underweight.   A BMI of 18.5 to 24.9 is normal.   A BMI of 25 to 29.9 is considered overweight.   A BMI of 30 and above  is considered obese.   Maintain normal blood lipids and cholesterol levels by exercising and minimizing your intake of saturated fat. Eat a balanced diet with plenty of fruit and vegetables. Blood tests for lipids and cholesterol should begin at age 20 and be repeated every 5 years. If your lipid or cholesterol levels are high, you are over 50, or you are a high risk for heart disease, you may need your cholesterol levels checked more frequently.Ongoing high lipid and cholesterol levels should be treated with medicines if diet and exercise are not effective.   If you smoke, find out from your caregiver how to quit. If you do not use tobacco, do not start.   If you choose to drink alcohol, do not exceed 2 drinks per day. One drink is considered to be 12 ounces (355 mL) of beer, 5 ounces (148 mL) of wine, or 1.5 ounces (44 mL) of liquor.   Avoid use of street drugs. Do not share needles with anyone. Ask for help if you need support or instructions about stopping the use of drugs.   High blood pressure causes heart disease and increases the risk of stroke. Your blood pressure should be checked at least every 1 to 2 years. Ongoing high blood pressure should be treated with medicines, if weight loss and exercise are not effective.   If you are 45 to 76 years old, ask your caregiver if you should take aspirin to prevent heart disease.   Diabetes screening involves taking a blood   sample to check your fasting blood sugar level. This should be done once every 3 years, after age 45, if you are within normal weight and without risk factors for diabetes. Testing should be considered at a younger age or be carried out more frequently if you are overweight and have at least 1 risk factor for diabetes.   Colorectal cancer can be detected and often prevented. Most routine colorectal cancer screening begins at the age of 50 and continues through age 75. However, your caregiver may recommend screening at an earlier  age if you have risk factors for colon cancer. On a yearly basis, your caregiver may provide home test kits to check for hidden blood in the stool. Use of a small camera at the end of a tube, to directly examine the colon (sigmoidoscopy or colonoscopy), can detect the earliest forms of colorectal cancer. Talk to your caregiver about this at age 50, when routine screening begins. Direct examination of the colon should be repeated every 5 to 10 years through age 75, unless early forms of pre-cancerous polyps or small growths are found.   Hepatitis C blood testing is recommended for all people born from 1945 through 1965 and any individual with known risks for hepatitis C.   Practice safe sex. Use condoms and avoid high-risk sexual practices to reduce the spread of sexually transmitted infections (STIs). STIs include gonorrhea, chlamydia, syphilis, trichomonas, herpes, HPV, and human immunodeficiency virus (HIV). Herpes, HIV, and HPV are viral illnesses that have no cure. They can result in disability, cancer, and death.   A one-time screening for abdominal aortic aneurysm (AAA) and surgical repair of large AAAs by sound wave imaging (ultrasonography) is recommended for ages 65 to 75 years who are current or former smokers.   Healthy men should no longer receive prostate-specific antigen (PSA) blood tests as part of routine cancer screening. Consult with your caregiver about prostate cancer screening.   Testicular cancer screening is not recommended for adult males who have no symptoms. Screening includes self-exam, caregiver exam, and other screening tests. Consult with your caregiver about any symptoms you have or any concerns you have about testicular cancer.   Use sunscreen with skin protection factor (SPF) of 30 or more. Apply sunscreen liberally and repeatedly throughout the day. You should seek shade when your shadow is shorter than you. Protect yourself by wearing long sleeves, pants, a  wide-brimmed hat, and sunglasses year round, whenever you are outdoors.   Once a month, do a whole body skin exam, using a mirror to look at the skin on your back. Notify your caregiver of new moles, moles that have irregular borders, moles that are larger than a pencil eraser, or moles that have changed in shape or color.   Stay current with required immunizations.   Influenza. You need a dose every fall (or winter). The composition of the flu vaccine changes each year, so being vaccinated once is not enough.   Pneumococcal polysaccharide. You need 1 to 2 doses if you smoke cigarettes or if you have certain chronic medical conditions. You need 1 dose at age 65 (or older) if you have never been vaccinated.   Tetanus, diphtheria, pertussis (Tdap, Td). Get 1 dose of Tdap vaccine if you are younger than age 65 years, are over 65 and have contact with an infant, are a healthcare worker, or simply want to be protected from whooping cough. After that, you need a Td booster dose every 10 years. Consult your caregiver if   you have not had at least 3 tetanus and diphtheria-containing shots sometime in your life or have a deep or dirty wound.   HPV. This vaccine is recommended for males 13 through 76 years of age. This vaccine may be given to men 22 through 76 years of age who have not completed the 3 dose series. It is recommended for men through age 26 who have sex with men or whose immune system is weakened because of HIV infection, other illness, or medications. The vaccine is given in 3 doses over 6 months.   Measles, mumps, rubella (MMR). You need at least 1 dose of MMR if you were born in 1957 or later. You may also need a 2nd dose.   Meningococcal. If you are age 19 to 21 years and a first-year college student living in a residence hall, or have one of several medical conditions, you need to get vaccinated against meningococcal disease. You may also need additional booster doses.   Zoster (shingles).  If you are age 60 years or older, you should get this vaccine.   Varicella (chickenpox). If you have never had chickenpox or you were vaccinated but received only 1 dose, talk to your caregiver to find out if you need this vaccine.   Hepatitis A. You need this vaccine if you have a specific risk factor for hepatitis A virus infection, or you simply wish to be protected from this disease. The vaccine is usually given as 2 doses, 6 to 18 months apart.   Hepatitis B. You need this vaccine if you have a specific risk factor for hepatitis B virus infection or you simply wish to be protected from this disease. The vaccine is given in 3 doses, usually over 6 months.  Preventative Service / Frequency Ages 19 to 39  Blood pressure check.** / Every 1 to 2 years.   Lipid and cholesterol check.** / Every 5 years beginning at age 20.   Hepatitis C blood test.** / For any individual with known risks for hepatitis C.   Skin self-exam. / Monthly.   Influenza immunization.** / Every year.   Pneumococcal polysaccharide immunization.** / 1 to 2 doses if you smoke cigarettes or if you have certain chronic medical conditions.   Tetanus, diphtheria, pertussis (Tdap,Td) immunization. / A one-time dose of Tdap vaccine. After that, you need a Td booster dose every 10 years.   HPV immunization. / 3 doses over 6 months, if 26 and younger.   Measles, mumps, rubella (MMR) immunization. / You need at least 1 dose of MMR if you were born in 1957 or later. You may also need a 2nd dose.   Meningococcal immunization. / 1 dose if you are age 19 to 21 years and a first-year college student living in a residence hall, or have one of several medical conditions, you need to get vaccinated against meningococcal disease. You may also need additional booster doses.   Varicella immunization.** / Consult your caregiver.   Hepatitis A immunization.** / Consult your caregiver. 2 doses, 6 to 18 months apart.   Hepatitis B  immunization.** / Consult your caregiver. 3 doses usually over 6 months.  Ages 40 to 64  Blood pressure check.** / Every 1 to 2 years.   Lipid and cholesterol check.** / Every 5 years beginning at age 20.   Fecal occult blood test (FOBT) of stool. / Every year beginning at age 50 and continuing until age 75. You may not have to do this test if   you get colonoscopy every 10 years.   Flexible sigmoidoscopy** or colonoscopy.** / Every 5 years for a flexible sigmoidoscopy or every 10 years for a colonoscopy beginning at age 50 and continuing until age 75.   Hepatitis C blood test.** / For all people born from 1945 through 1965 and any individual with known risks for hepatitis C.   Skin self-exam. / Monthly.   Influenza immunization.** / Every year.   Pneumococcal polysaccharide immunization.** / 1 to 2 doses if you smoke cigarettes or if you have certain chronic medical conditions.   Tetanus, diphtheria, pertussis (Tdap/Td) immunization.** / A one-time dose of Tdap vaccine. After that, you need a Td booster dose every 10 years.   Measles, mumps, rubella (MMR) immunization. / You need at least 1 dose of MMR if you were born in 1957 or later. You may also need a 2nd dose.   Varicella immunization.**/ Consult your caregiver.   Meningococcal immunization.** / Consult your caregiver.   Hepatitis A immunization.** / Consult your caregiver. 2 doses, 6 to 18 months apart.   Hepatitis B immunization.** / Consult your caregiver. 3 doses, usually over 6 months.  Ages 65 and over  Blood pressure check.** / Every 1 to 2 years.   Lipid and cholesterol check.**/ Every 5 years beginning at age 20.   Fecal occult blood test (FOBT) of stool. / Every year beginning at age 50 and continuing until age 75. You may not have to do this test if you get colonoscopy every 10 years.   Flexible sigmoidoscopy** or colonoscopy.** / Every 5 years for a flexible sigmoidoscopy or every 10 years for a colonoscopy  beginning at age 50 and continuing until age 75.   Hepatitis C blood test.** / For all people born from 1945 through 1965 and any individual with known risks for hepatitis C.   Abdominal aortic aneurysm (AAA) screening.** / A one-time screening for ages 65 to 75 years who are current or former smokers.   Skin self-exam. / Monthly.   Influenza immunization.** / Every year.   Pneumococcal polysaccharide immunization.** / 1 dose at age 65 (or older) if you have never been vaccinated.   Tetanus, diphtheria, pertussis (Tdap, Td) immunization. / A one-time dose of Tdap vaccine if you are over 65 and have contact with an infant, are a healthcare worker, or simply want to be protected from whooping cough. After that, you need a Td booster dose every 10 years.   Varicella immunization. ** / Consult your caregiver.   Meningococcal immunization.** / Consult your caregiver.   Hepatitis A immunization. ** / Consult your caregiver. 2 doses, 6 to 18 months apart.   Hepatitis B immunization.** / Check with your caregiver. 3 doses, usually over 6 months.  **Family history and personal history of risk and conditions may change your caregiver's recommendations. Document Released: 09/16/2001 Document Revised: 07/10/2011 Document Reviewed: 12/16/2010 [employer] Patient Information 2012 [employer]. 

## 2012-04-14 NOTE — Assessment & Plan Note (Signed)
con't meds stable 

## 2012-04-14 NOTE — Progress Notes (Signed)
Subjective:    Joe Jackson is a 78 y.o. male who presents for Medicare Annual/Subsequent preventive examination.   Preventive Screening-Counseling & Management  Tobacco History  Smoking status  . Former Smoker -- 0.8 packs/day for 78 years  . Types: Cigarettes  . Quit date: 08/05/2011  Smokeless tobacco  . Current User  . Types: Chew  . Last Attempt to Quit: 08/11/2011    Problems Prior to Visit 1.   Current Problems (verified) Patient Active Problem List  Diagnosis  . NEVI, MULTIPLE  . HYPERLIPIDEMIA  . HYPERTENSION  . CORONARY HEART DISEASE  . Congestive Heart Failure, Unspecified  . ABDOMINAL ANEURYSM WITHOUT MENTION OF RUPTURE  . CLAUDICATION, INTERMITTENT  . VARICOSE VEINS, LOWER EXTREMITIES, WITH INFLAMMATION  . VARICOSE VEINS LOWER EXTREMITIES W/OTH COMPS  . Unspecified Sleep Apnea  . MEMORY LOSS  . MEDIASTINAL ADENOPATHY CASTLEMANS D  . ALKALINE PHOSPHATASE, ELEVATED  . INSECT BITE  . COPD  . OA (osteoarthritis of spine)  . Vomiting    Medications Prior to Visit Current Outpatient Prescriptions on File Prior to Visit  Medication Sig Dispense Refill  . aspirin EC 81 MG tablet Take 81 mg by mouth daily.      Marland Kitchen atorvastatin (LIPITOR) 40 MG tablet Take 20 mg by mouth at bedtime.      . budesonide-formoterol (SYMBICORT) 160-4.5 MCG/ACT inhaler Inhale 2 puffs into the lungs 2 (two) times daily.        . cetirizine (ZYRTEC) 10 MG tablet Take 10 mg by mouth daily.        . Coenzyme Q10 (CO Q 10 PO) Take 1 tablet by mouth daily.       . Cyanocobalamin (VITAMIN B-12 PO) Take 1 tablet by mouth daily.       . Garlic (ODOR FREE GARLIC) 100 MG TABS Take 1 tablet by mouth daily.        Marland Kitchen guaiFENesin (MUCINEX) 600 MG 12 hr tablet Take 600 mg by mouth daily.        Marland Kitchen ipratropium-albuterol (DUONEB) 0.5-2.5 (3) MG/3ML SOLN Take 3 mLs by nebulization 4 (four) times daily.       Marland Kitchen lisinopril-hydrochlorothiazide (PRINZIDE,ZESTORETIC) 20-12.5 MG per tablet Take 2 tablets  by mouth daily.       . Multiple Vitamin (MULITIVITAMIN WITH MINERALS) TABS Take 1 tablet by mouth daily.      . Omega-3 Fatty Acids (FISH OIL PO) Take 1 capsule by mouth daily.         Current Medications (verified) Current Outpatient Prescriptions  Medication Sig Dispense Refill  . aspirin EC 81 MG tablet Take 81 mg by mouth daily.      Marland Kitchen atorvastatin (LIPITOR) 40 MG tablet Take 20 mg by mouth at bedtime.      . budesonide-formoterol (SYMBICORT) 160-4.5 MCG/ACT inhaler Inhale 2 puffs into the lungs 2 (two) times daily.        . cetirizine (ZYRTEC) 10 MG tablet Take 10 mg by mouth daily.        . Coenzyme Q10 (CO Q 10 PO) Take 1 tablet by mouth daily.       . Cyanocobalamin (VITAMIN B-12 PO) Take 1 tablet by mouth daily.       . Garlic (ODOR FREE GARLIC) 100 MG TABS Take 1 tablet by mouth daily.        Marland Kitchen guaiFENesin (MUCINEX) 600 MG 12 hr tablet Take 600 mg by mouth daily.        Marland Kitchen ipratropium-albuterol (DUONEB) 0.5-2.5 (  3) MG/3ML SOLN Take 3 mLs by nebulization 4 (four) times daily.       Marland Kitchen lisinopril-hydrochlorothiazide (PRINZIDE,ZESTORETIC) 20-12.5 MG per tablet Take 2 tablets by mouth daily.       . Multiple Vitamin (MULITIVITAMIN WITH MINERALS) TABS Take 1 tablet by mouth daily.      . Omega-3 Fatty Acids (FISH OIL PO) Take 1 capsule by mouth daily.       . NONFORMULARY OR COMPOUNDED ITEM Penis pump as directed  1 each  0     Allergies (verified) Review of patient's allergies indicates no known allergies.   PAST HISTORY  Family History Family History  Problem Relation Age of Onset  . Heart disease Mother     Heart Disease before age  71  . Other Mother     Rheumatism  . Heart attack Mother   . Heart attack Father     Social History History  Substance Use Topics  . Smoking status: Former Smoker -- 0.8 packs/day for 78 years    Types: Cigarettes    Quit date: 08/05/2011  . Smokeless tobacco: Current User    Types: Chew    Last Attempt to Quit: 08/11/2011  . Alcohol  Use: No    Are there smokers in your home (other than you)?  No  Risk Factors Current exercise habits: dance 3x a week and yard work  Dietary issues discussed: no   Cardiac risk factors: advanced age (older than 64 for men, 51 for women), dyslipidemia, hypertension and male gender.  Depression Screen (Note: if answer to either of the following is "Yes", a more complete depression screening is indicated)   Q1: Over the past two weeks, have you felt down, depressed or hopeless? No  Q2: Over the past two weeks, have you felt little interest or pleasure in doing things? No  Have you lost interest or pleasure in daily life? No  Do you often feel hopeless? No  Do you cry easily over simple problems? No  Activities of Daily Living In your present state of health, do you have any difficulty performing the following activities?:  Driving? Yes Managing money?  Yes Feeding yourself? Yes Getting from bed to chair? Yes Climbing a flight of stairs? Yes Preparing food and eating?: Yes Bathing or showering? Yes Getting dressed: Yes Getting to the toilet? Yes Using the toilet:Yes Moving around from place to place: Yes In the past year have you fallen or had a near fall?:No   Are you sexually active?  Yes  Do you have more than one partner?  No  Hearing Difficulties: No Do you often ask people to speak up or repeat themselves? No Do you experience ringing or noises in your ears? No Do you have difficulty understanding soft or whispered voices? No   Do you feel that you have a problem with memory? No  Do you often misplace items? No  Do you feel safe at home?  Yes  Cognitive Testing  Alert? Yes  Normal Appearance?Yes  Oriented to person? Yes  Place? Yes   Time? Yes  Recall of three objects?  Yes  Can perform simple calculations? Yes  Displays appropriate judgment?Yes  Can read the correct time from a watch face?Yes   Advanced Directives have been discussed with the patient?  Yes   List the Names of Other Physician/Practitioners you currently use: 1.  pulm-- alva 2  Vascular--- dickson 3. opth? Indicate any recent Medical Services you may have received from other  than [facility] providers in the past year (date may be approximate).  Immunization History  Administered Date(s) Administered  . Influenza Split 06/10/2011  . Influenza Whole 05/29/2008, 05/10/2009, 05/21/2010  . Pneumococcal Polysaccharide 05/21/2010  . Td 03/27/2009    Screening Tests Health Maintenance  Topic Date Due  . Colonoscopy  09/28/1981  . Zostavax  09/29/1991  . Influenza Vaccine  05/04/2012  . Tetanus/tdap  03/28/2019  . Pneumococcal Polysaccharide Vaccine Age 38 And Over  Completed    All answers were reviewed with the patient and necessary referrals were made:  Loreen Freud, DO   04/14/2012   History reviewed: allergies, current medications, past family history, past medical history, past social history, past surgical history and problem list  Review of Systems  Review of Systems  Constitutional: Negative for activity change, appetite change and fatigue.  HENT: Negative for hearing loss, congestion, tinnitus and ear discharge.   Eyes: Negative for visual disturbance (see optho q1y -- done) Respiratory: Negative for cough, chest tightness and shortness of breath.   Cardiovascular: Negative for chest pain, palpitations and leg swelling.  Gastrointestinal: Negative for abdominal pain, diarrhea, constipation and abdominal distention.  Genitourinary: Negative for urgency, frequency, decreased urine volume and difficulty urinating.  Musculoskeletal: Negative for back pain, arthralgias and gait problem.  Skin: Negative for color change, pallor and rash.  Neurological: Negative for dizziness, light-headedness, numbness and headaches.  Hematological: Negative for adenopathy. Does not bruise/bleed easily.  Psychiatric/Behavioral: Negative for suicidal ideas, confusion, sleep disturbance,  self-injury, dysphoric mood, decreased concentration and agitation.  Pt is able to read and write and can do all ADLs No risk for falling No abuse/ violence in home     Objective:     Vision by Snellen chart: opth Blood pressure 140/62, pulse 64, temperature 98.2 F (36.8 C), temperature source Oral, height 5' 6.5" (1.689 m), weight 205 lb 6.4 oz (93.169 kg), SpO2 96.00%. Body mass index is 32.66 kg/(m^2).  BP 140/62  Pulse 64  Temp 98.2 F (36.8 C) (Oral)  Ht 5' 6.5" (1.689 m)  Wt 205 lb 6.4 oz (93.169 kg)  BMI 32.66 kg/m2  SpO2 96% General appearance: alert, cooperative, appears stated age and no distress Head: Normocephalic, without obvious abnormality, atraumatic Eyes: conjunctivae/corneas clear. PERRL, EOM's intact. Fundi benign. Ears: normal TM's and external ear canals both ears Nose: Nares normal. Septum midline. Mucosa normal. No drainage or sinus tenderness. Throat: lips, mucosa, and tongue normal; teeth and gums normal Neck: no adenopathy, no carotid bruit, no JVD, supple, symmetrical, trachea midline and thyroid not enlarged, symmetric, no tenderness/mass/nodules Back: symmetric, no curvature. ROM normal. No CVA tenderness. Lungs: clear to auscultation bilaterally Chest wall: no tenderness Heart: S1, S2 normal and + murmur Abdomen: soft, non-tender; bowel sounds normal; no masses,  no organomegaly Male genitalia: normal Rectal: normal tone, normal prostate, no masses or tenderness and soft brown guaiac negative stool noted Extremities: extremities normal, atraumatic, no cyanosis or edema Pulses: 2+ and symmetric Skin: Skin color, texture, turgor normal. No rashes or lesions Lymph nodes: Cervical, supraclavicular, and axillary nodes normal. Neurologic: Alert and oriented X 3, normal strength and tone. Normal symmetric reflexes. Normal coordination and gait Psych-- no      Assessment:     cpe     Plan:     During the course of the visit the patient was  educated and counseled about appropriate screening and preventive services including:    Pneumococcal vaccine   Influenza vaccine  Diabetes screening  Glaucoma  screening  Advanced directives: has NO advanced directive - not interested in additional information  Diet review for nutrition referral? Yes ____  Not Indicated _x___   Patient Instructions (the written plan) was given to the patient.  Medicare Attestation I have personally reviewed: The patient's medical and social history Their use of alcohol, tobacco or illicit drugs Their current medications and supplements The patient's functional ability including ADLs,fall risks, home safety risks, cognitive, and hearing and visual impairment Diet and physical activities Evidence for depression or mood disorders  The patient's weight, height, BMI, and visual acuity have been recorded in the chart.  I have made referrals, counseling, and provided education to the patient based on review of the above and I have provided the patient with a written personalized care plan for preventive services.     Loreen Freud, DO   04/14/2012

## 2012-04-14 NOTE — Telephone Encounter (Signed)
We [facility] Rx [facility]les[facility]oster [facility] - [id number]  Called pt as per instructions from CMA- spoke with wife Clotilde Dieter lee-she stated patient got vaccine 2-yrs ago. Got rx from [facility] had done in [place].  Had Brittney look in [facility] vaccine was prescribed  6.22.11

## 2012-04-14 NOTE — Addendum Note (Signed)
Addended by: Arnette Norris on: 04/14/2012 11:21 AM   Modules accepted: Orders

## 2012-04-15 LAB — LIPID PANEL
HDL: 42.5 mg/dL (ref >39.00)
Total CHOL/HDL Ratio: 5
Triglycerides: 147 mg/dL (ref 0.0–149.0)
VLDL: 29.4 mg/dL (ref 0.0–40.0)

## 2012-04-15 LAB — BASIC METABOLIC PANEL
BUN: 38 mg/dL — ABNORMAL HIGH (ref 6–23)
Calcium: 9.6 mg/dL (ref 8.4–10.5)
Chloride: 103 mEq/L (ref 96–112)
Creatinine, Ser: 1.5 mg/dL (ref 0.4–1.5)

## 2012-04-15 LAB — HEPATIC FUNCTION PANEL
Bilirubin, Direct: 0.5 mg/dL — ABNORMAL HIGH (ref 0.0–0.3)
Total Bilirubin: 1.5 mg/dL — ABNORMAL HIGH (ref 0.3–1.2)

## 2012-04-15 NOTE — Telephone Encounter (Signed)
Shingles vaccine was one in 2011--   KP

## 2012-04-20 ENCOUNTER — Other Ambulatory Visit: Payer: Self-pay | Admitting: Family Medicine

## 2012-04-20 DIAGNOSIS — E785 Hyperlipidemia, unspecified: Secondary | ICD-10-CM

## 2012-04-20 DIAGNOSIS — D7289 Other specified disorders of white blood cells: Secondary | ICD-10-CM

## 2012-05-10 ENCOUNTER — Encounter (HOSPITAL_BASED_OUTPATIENT_CLINIC_OR_DEPARTMENT_OTHER): Payer: Self-pay | Admitting: *Deleted

## 2012-05-10 ENCOUNTER — Emergency Department (HOSPITAL_BASED_OUTPATIENT_CLINIC_OR_DEPARTMENT_OTHER)
Admission: EM | Admit: 2012-05-10 | Discharge: 2012-05-10 | Disposition: A | Discharge status: Home / Self Care | Payer: Medicare PPO | Attending: Emergency Medicine | Admitting: Emergency Medicine

## 2012-05-10 ENCOUNTER — Emergency Department (HOSPITAL_BASED_OUTPATIENT_CLINIC_OR_DEPARTMENT_OTHER): Payer: Medicare PPO

## 2012-05-10 DIAGNOSIS — S61209A Unspecified open wound of unspecified finger without damage to nail, initial encounter: Secondary | ICD-10-CM | POA: Insufficient documentation

## 2012-05-10 DIAGNOSIS — J4489 Other specified chronic obstructive pulmonary disease: Secondary | ICD-10-CM | POA: Insufficient documentation

## 2012-05-10 DIAGNOSIS — W268XXA Contact with other sharp object(s), not elsewhere classified, initial encounter: Secondary | ICD-10-CM | POA: Insufficient documentation

## 2012-05-10 DIAGNOSIS — Y998 Other external cause status: Secondary | ICD-10-CM | POA: Insufficient documentation

## 2012-05-10 DIAGNOSIS — Z87891 Personal history of nicotine dependence: Secondary | ICD-10-CM | POA: Insufficient documentation

## 2012-05-10 DIAGNOSIS — G4730 Sleep apnea, unspecified: Secondary | ICD-10-CM | POA: Insufficient documentation

## 2012-05-10 DIAGNOSIS — I2510 Atherosclerotic heart disease of native coronary artery without angina pectoris: Secondary | ICD-10-CM | POA: Insufficient documentation

## 2012-05-10 DIAGNOSIS — J449 Chronic obstructive pulmonary disease, unspecified: Secondary | ICD-10-CM | POA: Insufficient documentation

## 2012-05-10 DIAGNOSIS — S61411A Laceration without foreign body of right hand, initial encounter: Secondary | ICD-10-CM

## 2012-05-10 DIAGNOSIS — I10 Essential (primary) hypertension: Secondary | ICD-10-CM | POA: Insufficient documentation

## 2012-05-10 DIAGNOSIS — I252 Old myocardial infarction: Secondary | ICD-10-CM | POA: Insufficient documentation

## 2012-05-10 DIAGNOSIS — Y9389 Activity, other specified: Secondary | ICD-10-CM | POA: Insufficient documentation

## 2012-05-10 DIAGNOSIS — E785 Hyperlipidemia, unspecified: Secondary | ICD-10-CM | POA: Insufficient documentation

## 2012-05-10 DIAGNOSIS — I509 Heart failure, unspecified: Secondary | ICD-10-CM | POA: Insufficient documentation

## 2012-05-10 NOTE — ED Provider Notes (Signed)
History     CSN: [id number]  Arrival date & time 05/10/12  2006   First MD Initiated Contact with Patient 05/10/12 2114      Chief Complaint  Patient presents with  . Laceration    (Consider location/radiation/quality/duration/timing/severity/associated sxs/prior treatment) HPI Comments: Joe Jackson is a 78 y.o. Male who presents with laceration to right 5th finger. States was puling on  chain and it slipped and cut his finger. Tetanus up to date. No weakness or numbness distal to the injury. Hemostatic. No other complaints.      Past Medical History  Diagnosis Date  . CHF (congestive heart failure)   . Hypertension   . Hyperlipidemia   . Coronary heart disease     ish CMP 45%, aortic stenosis 1.0 cm 211/08  . Sleep apnea   . Aortic aneurysm, abdominal   . Pulmonary fibrosis   . CHF (congestive heart failure)   . Myocardial infarction   . COPD (chronic obstructive pulmonary disease)     Past Surgical History  Procedure Date  . Neck surgery   . Arm surgery   . Back surgery   . Leg surgery     Family History  Problem Relation Age of Onset  . Heart disease Mother     Heart Disease before age  42  . Other Mother     Rheumatism  . Heart attack Mother   . Heart attack Father     History  Substance Use Topics  . Smoking status: Former Smoker -- 0.8 packs/day for 78 years    Types: Cigarettes    Quit date: 08/05/2011  . Smokeless tobacco: Current User    Types: Chew    Last Attempt to Quit: 08/11/2011  . Alcohol Use: No      Review of Systems  Constitutional: Negative for fever and chills.  Respiratory: Negative.   Cardiovascular: Negative.   Skin: Positive for wound.  Neurological: Negative for weakness and numbness.  Hematological: Does not bruise/bleed easily.    Allergies  Review of patient's allergies indicates no known allergies.  Home Medications   Current Outpatient Rx  Name Route Sig Dispense Refill  . ASPIRIN EC 81 MG PO TBEC Oral  Take 81 mg by mouth daily.    . ATORVASTATIN CALCIUM 40 MG PO TABS Oral Take 0.5 tablets (20 mg total) by mouth at bedtime. 45 tablet 3  . BUDESONIDE-FORMOTEROL FUMARATE 160-4.5 MCG/ACT IN AERO Inhalation Inhale 2 puffs into the lungs 2 (two) times daily. 1 Inhaler 11  . CETIRIZINE HCL 10 MG PO TABS Oral Take 10 mg by mouth daily.      . CO Q 10 PO Oral Take 1 tablet by mouth daily.     Marland Kitchen VITAMIN B-12 PO Oral Take 1 tablet by mouth daily.     Marland Kitchen GARLIC 100 MG PO TABS Oral Take 1 tablet by mouth daily.      . GUAIFENESIN ER 600 MG PO TB12 Oral Take 600 mg by mouth daily.      . IPRATROPIUM-ALBUTEROL 0.5-2.5 (3) MG/3ML IN SOLN Nebulization Take 3 mLs by nebulization 4 (four) times daily.     Marland Kitchen LISINOPRIL-HYDROCHLOROTHIAZIDE 20-12.5 MG PO TABS Oral Take 2 tablets by mouth daily. 180 tablet 3  . ADULT MULTIVITAMIN W/MINERALS CH Oral Take 1 tablet by mouth daily.    . NONFORMULARY OR COMPOUNDED ITEM  Penis pump as directed 1 each 0  . FISH OIL PO Oral Take 1 capsule by mouth daily.  BP 147/55  Pulse 99  Temp 98.7 F (37.1 C) (Oral)  Resp 16  Ht 5\' 7"  (1.702 m)  Wt 210 lb (95.255 kg)  BMI 32.89 kg/m2  SpO2 100%  Physical Exam  Nursing note and vitals reviewed. Constitutional: He appears well-developed and well-nourished. No distress.  Cardiovascular: Normal rate, regular rhythm and normal heart sounds.   Pulmonary/Chest: Effort normal and breath sounds normal. No respiratory distress. He has no wheezes. He has no rales.  Musculoskeletal:       6cm laceration to the right 5th finger. Hemostatic. Laceration is vertical, crosses the pip joint of the dorsal finger. Good strength against resistance with flexion and extension of the finger  Neurological: He is alert.  Skin: Skin is warm and dry.    ED Course  Procedures (including critical care time)  LACERATION REPAIR Performed by: Lottie Mussel Authorized by: Jaynie Crumble A Consent: Verbal consent obtained. Risks  and benefits: risks, benefits and alternatives were discussed Consent given by: patient Patient identity confirmed: provided demographic data Prepped and Draped in normal sterile fashion Wound explored  Laceration Location: right 5th finger  Laceration Length: 6cm  No Foreign Bodies seen or palpated  Anesthesia: local infiltration  Local anesthetic: lidocaine 2% wo epinephrine  Anesthetic total: 4 ml  Irrigation method: syringe Amount of cleaning: standard  Skin closure: ethilon  Number of sutures: 9  Technique: simple interrupted  Patient tolerance: Patient tolerated the procedure well with no immediate complications.  Results for orders placed in visit on 04/14/12  BASIC METABOLIC PANEL      Component Value Range   Sodium 141  135 - 145 mEq/L   Potassium 5.6 (*) 3.5 - 5.1 mEq/L   Chloride 103  96 - 112 mEq/L   CO2 29  19 - 32 mEq/L   Glucose, Bld 96  70 - 99 mg/dL   BUN 38 (*) 6 - 23 mg/dL   Creatinine, Ser 1.5  0.4 - 1.5 mg/dL   Calcium 9.6  8.4 - 45.4 mg/dL   GFR 09.81 (*) >19.14 mL/min  CBC WITH DIFFERENTIAL      Component Value Range   WBC 10.3  4.5 - 10.5 K/uL   RBC 5.03  4.22 - 5.81 Mil/uL   Hemoglobin 13.7  13.0 - 17.0 g/dL   HCT 78.2  95.6 - 21.3 %   MCV 85.6  78.0 - 100.0 fl   MCHC 31.9  30.0 - 36.0 g/dL   RDW 08.6 (*) 57.8 - 46.9 %   Platelets 86.0 (*) 150.0 - 400.0 K/uL   Neutrophils Relative 58.8  43.0 - 77.0 %   Lymphocytes Relative 20.3  12.0 - 46.0 %   Monocytes Relative 7.9  3.0 - 12.0 %   Eosinophils Relative 12.3 (*) 0.0 - 5.0 %   Basophils Relative 0.7  0.0 - 3.0 %   Neutro Abs 6.1  1.4 - 7.7 K/uL   Lymphs Abs 2.1  0.7 - 4.0 K/uL   Monocytes Absolute 0.8  0.1 - 1.0 K/uL   Eosinophils Absolute 1.3 (*) 0.0 - 0.7 K/uL   Basophils Absolute 0.1  0.0 - 0.1 K/uL  HEPATIC FUNCTION PANEL      Component Value Range   Total Bilirubin 1.5 (*) 0.3 - 1.2 mg/dL   Bilirubin, Direct 0.5 (*) 0.0 - 0.3 mg/dL   Alkaline Phosphatase 94  39 - 117 U/L    AST 45 (*) 0 - 37 U/L   ALT 13  0 - 53  U/L   Total Protein 7.8  6.0 - 8.3 g/dL   Albumin 4.2  3.5 - 5.2 g/dL  LIPID PANEL      Component Value Range   Cholesterol 222 (*) 0 - 200 mg/dL   Triglycerides 161.0  0.0 - 149.0 mg/dL   HDL 96.04  >54.09 mg/dL   VLDL 81.1  0.0 - 91.4 mg/dL   Total CHOL/HDL Ratio 5    POCT URINALYSIS DIPSTICK      Component Value Range   Color, UA yellow     Clarity, UA clear     Glucose, UA Neg     Bilirubin, UA Neg     Ketones, UA Neg     Spec Grav, UA 1.010     Blood, UA Neg     pH, UA 6.5     Protein, UA Neg     Urobilinogen, UA 0.2     Nitrite, UA Neg     Leukocytes, UA Negative    PSA      Component Value Range   PSA 1.37  0.10 - 4.00 ng/mL  LDL CHOLESTEROL, DIRECT      Component Value Range   Direct LDL 140.4     Dg Finger Little Right  05/10/2012  *RADIOLOGY REPORT*  Clinical Data: Laceration to medial aspect of the right small finger  RIGHT LITTLE FINGER 2+V  Comparison: None.  Findings: No fracture or dislocation.  Regional soft tissues are normal.  No radiopaque foreign body. There is minimal degenerative change of the MCP joints. Remote injury of the ulnar styloid process.  IMPRESSION: No acute fracture, dislocation or radiopaque foreign body.   Original Report Authenticated By: Waynard Reeds, M.D.      1. Laceration of right hand       MDM  Laceration of finger, repaired. X-ray negative. Tetanus up to date. Wound explored, no signs tendon or nerve injury        Lottie Mussel, PA 05/10/12 2335

## 2012-05-10 NOTE — ED Notes (Signed)
Pt c/o laceration to right 5th finger x 4 hrs ago

## 2012-05-10 NOTE — ED Notes (Signed)
Right small finger soaking in sterile water and iodine solution

## 2012-05-11 NOTE — ED Provider Notes (Signed)
Medical screening examination/treatment/procedure(s) were performed by non-physician practitioner and as supervising physician I was immediately available for consultation/collaboration.  Gavin Telford K Linker, MD 05/11/12 1515 

## 2012-08-31 ENCOUNTER — Encounter: Payer: Self-pay | Admitting: Adult Health

## 2012-08-31 ENCOUNTER — Ambulatory Visit (INDEPENDENT_AMBULATORY_CARE_PROVIDER_SITE_OTHER): Payer: Medicare PPO | Admitting: Adult Health

## 2012-08-31 VITALS — BP 142/88 | HR 51 | Temp 98.1°F | Wt 208.0 lb

## 2012-08-31 DIAGNOSIS — J449 Chronic obstructive pulmonary disease, unspecified: Secondary | ICD-10-CM
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
## 2012-08-31 NOTE — Assessment & Plan Note (Signed)
Compensated on present regimen follow up Dr. Vassie Loll  In 4 months and As needed

## 2012-08-31 NOTE — Patient Instructions (Addendum)
Continue on current regimen  We are very proud of you for quitting smoking  follow up Dr. Vassie Loll  In 4 months and As needed

## 2012-10-20 ENCOUNTER — Telehealth: Payer: Self-pay | Admitting: Family Medicine

## 2012-10-20 ENCOUNTER — Ambulatory Visit (INDEPENDENT_AMBULATORY_CARE_PROVIDER_SITE_OTHER): Payer: Medicare PPO | Admitting: Family Medicine

## 2012-10-20 ENCOUNTER — Ambulatory Visit (HOSPITAL_BASED_OUTPATIENT_CLINIC_OR_DEPARTMENT_OTHER)
Admission: RE | Admit: 2012-10-20 | Discharge: 2012-10-20 | Disposition: A | Discharge status: Home / Self Care | Payer: Medicare PPO | Source: Ambulatory Visit | Attending: Family Medicine | Admitting: Family Medicine

## 2012-10-20 ENCOUNTER — Encounter: Payer: Self-pay | Admitting: Family Medicine

## 2012-10-20 VITALS — BP 128/74 | HR 72 | Temp 98.1°F | Wt 201.0 lb

## 2012-10-20 DIAGNOSIS — J209 Acute bronchitis, unspecified: Secondary | ICD-10-CM

## 2012-10-20 DIAGNOSIS — J441 Chronic obstructive pulmonary disease with (acute) exacerbation: Secondary | ICD-10-CM | POA: Insufficient documentation

## 2012-10-20 DIAGNOSIS — R05 Cough: Secondary | ICD-10-CM | POA: Insufficient documentation

## 2012-10-20 DIAGNOSIS — R059 Cough, unspecified: Secondary | ICD-10-CM | POA: Insufficient documentation

## 2012-10-20 MED ORDER — AZITHROMYCIN 250 MG PO TABS: ORAL_TABLET | ORAL | Status: DC

## 2012-10-20 MED ORDER — PREDNISONE 10 MG PO TABS: ORAL_TABLET | ORAL | Status: DC

## 2012-10-20 MED ORDER — METHYLPREDNISOLONE ACETATE 80 MG/ML IJ SUSP
80.0000 mg | Freq: Once | INTRAMUSCULAR | Status: AC
  Administered 2012-10-20: 80 mg via INTRAMUSCULAR

## 2012-10-20 NOTE — Telephone Encounter (Signed)
Patient Information:  Caller Name: Marlise Eves  Phone: [phone]  Patient: Joe Jackson, Joe Jackson  Gender: Male  DOB: 12/11/1929  Age: 79 Years  PCP: Lelon Perla.  Office Follow Up:  Does the office need to follow up with this patient?: No  Instructions For The Office: N/A   Symptoms  Reason For Call & Symptoms: Pt's wife states pt has COPD and has congestion and cough and having difficulity breathing.  Reviewed Health History In EMR: Yes  Reviewed Medications In EMR: Yes  Reviewed Allergies In EMR: Yes  Reviewed Surgeries / Procedures: Yes  Date of Onset of Symptoms: 10/16/2012  Treatments Tried: Nebs and inhaler  Treatments Tried Worked: Yes  Guideline(s) Used:  Breathing Difficulty  Asthma Attack  Disposition Per Guideline:   Go to Office Now  Reason For Disposition Reached:   Moderate asthma attack (e.g., SOB at rest, speaks in phrases, audible wheezes) and not resolved after 2 nebulizer or inhaler treatments given 20 minutes apart  Advice Given:  N/A  Patient Will Follow Care Advice:  YES  Appointment Scheduled:  10/20/2012 11:30:00 Appointment Scheduled Provider:  Lelon Perla.

## 2012-10-20 NOTE — Telephone Encounter (Signed)
Pt seen in office today.

## 2012-10-20 NOTE — Progress Notes (Signed)
  Subjective:     Joe Jackson is a 79 y.o. male here for evaluation of a cough. Onset of symptoms was 4 days ago. Symptoms have been gradually worsening since that time. The cough is productive and is aggravated by cold air, infection and reclining position. Associated symptoms include: shortness of breath, sputum production and wheezing. Patient does have copd have a history of asthma. Patient does not have a history of environmental allergens. Patient has not traveled recently. Patient does have a history of smoking. Patient has had a previous chest x-ray. Patient has not had a PPD done.  The following portions of the patient's history were reviewed and updated as appropriate: allergies, current medications, past family history, past medical history, past social history, past surgical history and problem list.  Review of Systems Pertinent items are noted in HPI.    Objective:    Oxygen saturation 97% on room air BP 128/74  Pulse 72  Temp(Src) 98.1 F (36.7 C) (Oral)  Wt 201 lb (91.173 kg)  BMI 31.47 kg/m2  SpO2 97% General appearance: alert, cooperative, appears stated age and no distress Lungs: rhonchi bilaterally and wheezes bilaterally Heart: S1, S2 normal Extremities: extremities normal, atraumatic, no cyanosis or edema    Assessment:    Acute Bronchitis and COPD with exacerbation    Plan:    Antibiotics per medication orders. Antitussives per medication orders. Avoid exposure to tobacco smoke and fumes. B-agonist inhaler. Call if shortness of breath worsens, blood in sputum, change in character of cough, development of fever or chills, inability to maintain nutrition and hydration. Avoid exposure to tobacco smoke and fumes. Chest x-ray. con't current meds and rto prn

## 2012-10-20 NOTE — Patient Instructions (Addendum)

## 2012-10-23 ENCOUNTER — Emergency Department (HOSPITAL_BASED_OUTPATIENT_CLINIC_OR_DEPARTMENT_OTHER): Payer: Medicare PPO

## 2012-10-23 ENCOUNTER — Inpatient Hospital Stay (HOSPITAL_BASED_OUTPATIENT_CLINIC_OR_DEPARTMENT_OTHER)
Admission: EM | Admit: 2012-10-23 | Discharge: 2012-10-26 | DRG: 190 | Disposition: A | Discharge status: Home / Self Care | Payer: Medicare PPO | Attending: Internal Medicine | Admitting: Internal Medicine

## 2012-10-23 ENCOUNTER — Encounter (HOSPITAL_BASED_OUTPATIENT_CLINIC_OR_DEPARTMENT_OTHER): Payer: Self-pay | Admitting: Emergency Medicine

## 2012-10-23 DIAGNOSIS — I5042 Chronic combined systolic (congestive) and diastolic (congestive) heart failure: Secondary | ICD-10-CM | POA: Diagnosis present

## 2012-10-23 DIAGNOSIS — I714 Abdominal aortic aneurysm, without rupture, unspecified: Secondary | ICD-10-CM | POA: Diagnosis present

## 2012-10-23 DIAGNOSIS — D72829 Elevated white blood cell count, unspecified: Secondary | ICD-10-CM | POA: Diagnosis present

## 2012-10-23 DIAGNOSIS — G4730 Sleep apnea, unspecified: Secondary | ICD-10-CM

## 2012-10-23 DIAGNOSIS — R599 Enlarged lymph nodes, unspecified: Secondary | ICD-10-CM

## 2012-10-23 DIAGNOSIS — Z79899 Other long term (current) drug therapy: Secondary | ICD-10-CM

## 2012-10-23 DIAGNOSIS — D239 Other benign neoplasm of skin, unspecified: Secondary | ICD-10-CM

## 2012-10-23 DIAGNOSIS — I2589 Other forms of chronic ischemic heart disease: Secondary | ICD-10-CM | POA: Diagnosis present

## 2012-10-23 DIAGNOSIS — G4733 Obstructive sleep apnea (adult) (pediatric): Secondary | ICD-10-CM | POA: Diagnosis present

## 2012-10-23 DIAGNOSIS — I83893 Varicose veins of bilateral lower extremities with other complications: Secondary | ICD-10-CM

## 2012-10-23 DIAGNOSIS — IMO0002 Reserved for concepts with insufficient information to code with codable children: Secondary | ICD-10-CM

## 2012-10-23 DIAGNOSIS — I509 Heart failure, unspecified: Secondary | ICD-10-CM

## 2012-10-23 DIAGNOSIS — I252 Old myocardial infarction: Secondary | ICD-10-CM

## 2012-10-23 DIAGNOSIS — Z792 Long term (current) use of antibiotics: Secondary | ICD-10-CM

## 2012-10-23 DIAGNOSIS — Z87891 Personal history of nicotine dependence: Secondary | ICD-10-CM

## 2012-10-23 DIAGNOSIS — Z7982 Long term (current) use of aspirin: Secondary | ICD-10-CM

## 2012-10-23 DIAGNOSIS — I8310 Varicose veins of unspecified lower extremity with inflammation: Secondary | ICD-10-CM

## 2012-10-23 DIAGNOSIS — I359 Nonrheumatic aortic valve disorder, unspecified: Secondary | ICD-10-CM | POA: Diagnosis present

## 2012-10-23 DIAGNOSIS — E860 Dehydration: Secondary | ICD-10-CM | POA: Diagnosis present

## 2012-10-23 DIAGNOSIS — N289 Disorder of kidney and ureter, unspecified: Secondary | ICD-10-CM | POA: Diagnosis present

## 2012-10-23 DIAGNOSIS — J9620 Acute and chronic respiratory failure, unspecified whether with hypoxia or hypercapnia: Secondary | ICD-10-CM | POA: Diagnosis present

## 2012-10-23 DIAGNOSIS — N189 Chronic kidney disease, unspecified: Secondary | ICD-10-CM | POA: Diagnosis present

## 2012-10-23 DIAGNOSIS — J8410 Pulmonary fibrosis, unspecified: Secondary | ICD-10-CM | POA: Diagnosis present

## 2012-10-23 DIAGNOSIS — T148 Other injury of unspecified body region: Secondary | ICD-10-CM

## 2012-10-23 DIAGNOSIS — J441 Chronic obstructive pulmonary disease with (acute) exacerbation: Principal | ICD-10-CM | POA: Diagnosis present

## 2012-10-23 DIAGNOSIS — D696 Thrombocytopenia, unspecified: Secondary | ICD-10-CM | POA: Diagnosis present

## 2012-10-23 DIAGNOSIS — I4891 Unspecified atrial fibrillation: Secondary | ICD-10-CM | POA: Diagnosis present

## 2012-10-23 DIAGNOSIS — R1110 Vomiting, unspecified: Secondary | ICD-10-CM

## 2012-10-23 DIAGNOSIS — I129 Hypertensive chronic kidney disease with stage 1 through stage 4 chronic kidney disease, or unspecified chronic kidney disease: Secondary | ICD-10-CM | POA: Diagnosis present

## 2012-10-23 DIAGNOSIS — I2510 Atherosclerotic heart disease of native coronary artery without angina pectoris: Secondary | ICD-10-CM | POA: Diagnosis present

## 2012-10-23 DIAGNOSIS — I739 Peripheral vascular disease, unspecified: Secondary | ICD-10-CM

## 2012-10-23 DIAGNOSIS — I10 Essential (primary) hypertension: Secondary | ICD-10-CM

## 2012-10-23 DIAGNOSIS — M479 Spondylosis, unspecified: Secondary | ICD-10-CM

## 2012-10-23 DIAGNOSIS — R413 Other amnesia: Secondary | ICD-10-CM

## 2012-10-23 DIAGNOSIS — I480 Paroxysmal atrial fibrillation: Secondary | ICD-10-CM

## 2012-10-23 DIAGNOSIS — J449 Chronic obstructive pulmonary disease, unspecified: Secondary | ICD-10-CM

## 2012-10-23 DIAGNOSIS — I255 Ischemic cardiomyopathy: Secondary | ICD-10-CM

## 2012-10-23 DIAGNOSIS — Z8249 Family history of ischemic heart disease and other diseases of the circulatory system: Secondary | ICD-10-CM

## 2012-10-23 DIAGNOSIS — R21 Rash and other nonspecific skin eruption: Secondary | ICD-10-CM | POA: Diagnosis present

## 2012-10-23 DIAGNOSIS — W57XXXA Bitten or stung by nonvenomous insect and other nonvenomous arthropods, initial encounter: Secondary | ICD-10-CM

## 2012-10-23 DIAGNOSIS — R748 Abnormal levels of other serum enzymes: Secondary | ICD-10-CM

## 2012-10-23 DIAGNOSIS — E785 Hyperlipidemia, unspecified: Secondary | ICD-10-CM | POA: Diagnosis present

## 2012-10-23 DIAGNOSIS — E119 Type 2 diabetes mellitus without complications: Secondary | ICD-10-CM | POA: Diagnosis present

## 2012-10-23 HISTORY — DX: Paroxysmal atrial fibrillation: I48.0

## 2012-10-23 HISTORY — DX: Atherosclerotic heart disease of native coronary artery without angina pectoris: I25.10

## 2012-10-23 HISTORY — DX: Chronic combined systolic (congestive) and diastolic (congestive) heart failure: I50.42

## 2012-10-23 HISTORY — DX: Ischemic cardiomyopathy: I25.5

## 2012-10-23 LAB — CBC
HCT: 36.2 % — ABNORMAL LOW (ref 39.0–52.0)
MCH: 27.1 pg (ref 26.0–34.0)
MCHC: 33.1 g/dL (ref 30.0–36.0)
MCV: 81.7 fL (ref 78.0–100.0)
Platelets: 77 10*3/uL — ABNORMAL LOW (ref 150–400)
RDW: 14.5 % (ref 11.5–15.5)

## 2012-10-23 LAB — PRO B NATRIURETIC PEPTIDE: Pro B Natriuretic peptide (BNP): 728 pg/mL — ABNORMAL HIGH (ref 0–450)

## 2012-10-23 LAB — CBC WITH DIFFERENTIAL/PLATELET
Eosinophils Relative: 0 % (ref 0–5)
Lymphs Abs: 1.3 10*3/uL (ref 0.7–4.0)
MCH: 27.2 pg (ref 26.0–34.0)
MCV: 85 fL (ref 78.0–100.0)
Monocytes Absolute: 0.7 10*3/uL (ref 0.1–1.0)
Platelets: 84 10*3/uL — ABNORMAL LOW (ref 150–400)
RBC: 4.74 MIL/uL (ref 4.22–5.81)
RDW: 14.6 % (ref 11.5–15.5)

## 2012-10-23 LAB — BASIC METABOLIC PANEL
BUN: 52 mg/dL — ABNORMAL HIGH (ref 6–23)
GFR calc non Af Amer: 39 mL/min — ABNORMAL LOW (ref >90)
Glucose, Bld: 99 mg/dL (ref 70–99)
Potassium: 4.6 mEq/L (ref 3.5–5.1)

## 2012-10-23 LAB — LACTIC ACID, PLASMA: Lactic Acid, Venous: 1.7 mmol/L (ref 0.5–2.2)

## 2012-10-23 LAB — EXPECTORATED SPUTUM ASSESSMENT W GRAM STAIN, RFLX TO RESP C

## 2012-10-23 LAB — TROPONIN I: Troponin I: <0.3 ng/mL (ref <0.30)

## 2012-10-23 LAB — MRSA PCR SCREENING: MRSA by PCR: NEGATIVE

## 2012-10-23 LAB — CREATININE, SERUM: GFR calc Af Amer: 40 mL/min — ABNORMAL LOW (ref >90)

## 2012-10-23 MED ORDER — ACETAMINOPHEN 650 MG RE SUPP: 650.0000 mg | Freq: Four times a day (QID) | RECTAL | Status: DC | PRN

## 2012-10-23 MED ORDER — ASPIRIN EC 81 MG PO TBEC: 81.0000 mg | DELAYED_RELEASE_TABLET | Freq: Every day | ORAL | Status: DC

## 2012-10-23 MED ORDER — SODIUM CHLORIDE 0.9 % IV SOLN
INTRAVENOUS | Status: DC
  Administered 2012-10-23: 09:00:00 via INTRAVENOUS

## 2012-10-23 MED ORDER — DIPHENHYDRAMINE HCL 50 MG/ML IJ SOLN: 25.0000 mg | Freq: Three times a day (TID) | INTRAMUSCULAR | Status: DC | PRN

## 2012-10-23 MED ORDER — HYDROCODONE-ACETAMINOPHEN 5-325 MG PO TABS: 1.0000 | ORAL_TABLET | ORAL | Status: DC | PRN

## 2012-10-23 MED ORDER — SODIUM CHLORIDE 0.9 % IJ SOLN
3.0000 mL | Freq: Two times a day (BID) | INTRAMUSCULAR | Status: DC
  Administered 2012-10-24 – 2012-10-26 (×4): 3 mL via INTRAVENOUS

## 2012-10-23 MED ORDER — TIOTROPIUM BROMIDE MONOHYDRATE 18 MCG IN CAPS
18.0000 ug | ORAL_CAPSULE | Freq: Every day | RESPIRATORY_TRACT | Status: DC
  Administered 2012-10-23 – 2012-10-24 (×2): 18 ug via RESPIRATORY_TRACT
  Filled 2012-10-23: qty 5

## 2012-10-23 MED ORDER — DIPHENHYDRAMINE HCL 50 MG/ML IJ SOLN
50.0000 mg | Freq: Four times a day (QID) | INTRAMUSCULAR | Status: DC
  Administered 2012-10-23: 50 mg via INTRAVENOUS

## 2012-10-23 MED ORDER — DIPHENHYDRAMINE HCL 50 MG/ML IJ SOLN: 25.0000 mg | Freq: Four times a day (QID) | INTRAMUSCULAR | Status: DC

## 2012-10-23 MED ORDER — POLYETHYLENE GLYCOL 3350 17 G PO PACK
17.0000 g | PACK | Freq: Every day | ORAL | Status: DC | PRN
  Administered 2012-10-24: 17 g via ORAL
  Filled 2012-10-23 (×5): qty 1

## 2012-10-23 MED ORDER — ACETAMINOPHEN 325 MG PO TABS: 650.0000 mg | ORAL_TABLET | Freq: Four times a day (QID) | ORAL | Status: DC | PRN

## 2012-10-23 MED ORDER — ATORVASTATIN CALCIUM 20 MG PO TABS
20.0000 mg | ORAL_TABLET | Freq: Every day | ORAL | Status: DC
  Administered 2012-10-23 – 2012-10-25 (×3): 20 mg via ORAL
  Filled 2012-10-23 (×4): qty 1

## 2012-10-23 MED ORDER — METHYLPREDNISOLONE SODIUM SUCC 125 MG IJ SOLR
125.0000 mg | Freq: Once | INTRAMUSCULAR | Status: AC
  Administered 2012-10-23: 125 mg via INTRAVENOUS
  Filled 2012-10-23 (×2): qty 2

## 2012-10-23 MED ORDER — METHYLPREDNISOLONE SODIUM SUCC 125 MG IJ SOLR
80.0000 mg | Freq: Four times a day (QID) | INTRAMUSCULAR | Status: AC
Start: 2012-10-23 — End: 2012-10-25
  Administered 2012-10-23 – 2012-10-25 (×9): 80 mg via INTRAVENOUS
  Filled 2012-10-23 (×11): qty 1.28

## 2012-10-23 MED ORDER — HALOPERIDOL LACTATE 5 MG/ML IJ SOLN: 1.0000 mg | Freq: Four times a day (QID) | INTRAMUSCULAR | Status: DC | PRN

## 2012-10-23 MED ORDER — ALBUTEROL SULFATE (5 MG/ML) 0.5% IN NEBU
2.5000 mg | INHALATION_SOLUTION | RESPIRATORY_TRACT | Status: DC
  Administered 2012-10-23 – 2012-10-24 (×5): 2.5 mg via RESPIRATORY_TRACT
  Filled 2012-10-23 (×6): qty 0.5

## 2012-10-23 MED ORDER — FAMOTIDINE IN NACL 20-0.9 MG/50ML-% IV SOLN
20.0000 mg | Freq: Two times a day (BID) | INTRAVENOUS | Status: DC
  Administered 2012-10-23 (×2): 20 mg via INTRAVENOUS
  Filled 2012-10-23 (×4): qty 50

## 2012-10-23 MED ORDER — ALBUTEROL SULFATE (5 MG/ML) 0.5% IN NEBU
5.0000 mg | INHALATION_SOLUTION | Freq: Once | RESPIRATORY_TRACT | Status: AC
  Administered 2012-10-23: 5 mg via RESPIRATORY_TRACT
  Filled 2012-10-23: qty 1

## 2012-10-23 MED ORDER — HEPARIN SODIUM (PORCINE) 5000 UNIT/ML IJ SOLN
5000.0000 [IU] | Freq: Three times a day (TID) | INTRAMUSCULAR | Status: DC
Start: 2012-10-23 — End: 2012-10-24
  Administered 2012-10-23 – 2012-10-24 (×3): 5000 [IU] via SUBCUTANEOUS
  Filled 2012-10-23 (×6): qty 1

## 2012-10-23 MED ORDER — METHYLPREDNISOLONE SODIUM SUCC 125 MG IJ SOLR
60.0000 mg | Freq: Four times a day (QID) | INTRAMUSCULAR | Status: DC
  Filled 2012-10-23 (×3): qty 0.96

## 2012-10-23 MED ORDER — SODIUM CHLORIDE 0.9 % IV BOLUS (SEPSIS)
500.0000 mL | Freq: Once | INTRAVENOUS | Status: AC
  Administered 2012-10-23: 500 mL via INTRAVENOUS

## 2012-10-23 MED ORDER — DEXTROSE 5 % IV SOLN
500.0000 mg | Freq: Once | INTRAVENOUS | Status: AC
  Administered 2012-10-23: 500 mg via INTRAVENOUS
  Filled 2012-10-23 (×2): qty 500

## 2012-10-23 MED ORDER — ASPIRIN EC 81 MG PO TBEC
81.0000 mg | DELAYED_RELEASE_TABLET | Freq: Every day | ORAL | Status: DC
  Administered 2012-10-23 – 2012-10-26 (×4): 81 mg via ORAL
  Filled 2012-10-23 (×4): qty 1

## 2012-10-23 MED ORDER — LEVOFLOXACIN IN D5W 500 MG/100ML IV SOLN
500.0000 mg | INTRAVENOUS | Status: DC
  Administered 2012-10-23 – 2012-10-24 (×2): 500 mg via INTRAVENOUS
  Filled 2012-10-23 (×2): qty 100

## 2012-10-23 MED ORDER — BUDESONIDE-FORMOTEROL FUMARATE 160-4.5 MCG/ACT IN AERO
2.0000 | INHALATION_SPRAY | Freq: Two times a day (BID) | RESPIRATORY_TRACT | Status: DC
  Administered 2012-10-23 – 2012-10-24 (×3): 2 via RESPIRATORY_TRACT
  Filled 2012-10-23: qty 6

## 2012-10-23 MED ORDER — OMEGA-3-ACID ETHYL ESTERS 1 G PO CAPS
1.0000 g | ORAL_CAPSULE | Freq: Two times a day (BID) | ORAL | Status: DC
  Administered 2012-10-23 – 2012-10-26 (×7): 1 g via ORAL
  Filled 2012-10-23 (×8): qty 1

## 2012-10-23 MED ORDER — FUROSEMIDE 10 MG/ML IJ SOLN
40.0000 mg | Freq: Once | INTRAMUSCULAR | Status: AC
  Administered 2012-10-23: 40 mg via INTRAVENOUS
  Filled 2012-10-23: qty 4

## 2012-10-23 MED ORDER — DEXTROSE 5 % IV SOLN
1.0000 g | Freq: Once | INTRAVENOUS | Status: AC
  Administered 2012-10-23: 1 g via INTRAVENOUS
  Filled 2012-10-23: qty 10

## 2012-10-23 MED ORDER — DIPHENHYDRAMINE HCL 50 MG/ML IJ SOLN
INTRAMUSCULAR | Status: AC
  Administered 2012-10-23: 50 mg via INTRAVENOUS
  Filled 2012-10-23: qty 1

## 2012-10-23 MED ORDER — ALBUTEROL SULFATE (5 MG/ML) 0.5% IN NEBU: 2.5000 mg | INHALATION_SOLUTION | RESPIRATORY_TRACT | Status: DC | PRN

## 2012-10-23 MED ORDER — ONDANSETRON HCL 4 MG/2ML IJ SOLN: 4.0000 mg | Freq: Four times a day (QID) | INTRAMUSCULAR | Status: DC | PRN

## 2012-10-23 MED ORDER — ONDANSETRON HCL 4 MG PO TABS: 4.0000 mg | ORAL_TABLET | Freq: Four times a day (QID) | ORAL | Status: DC | PRN

## 2012-10-23 MED ORDER — IPRATROPIUM BROMIDE 0.02 % IN SOLN
0.5000 mg | Freq: Once | RESPIRATORY_TRACT | Status: AC
  Administered 2012-10-23: 0.5 mg via RESPIRATORY_TRACT
  Filled 2012-10-23: qty 2.5

## 2012-10-23 MED ORDER — DIPHENHYDRAMINE HCL 50 MG/ML IJ SOLN: 25.0000 mg | Freq: Three times a day (TID) | INTRAMUSCULAR | Status: DC

## 2012-10-23 NOTE — H&P (Addendum)
[facility] History and Physical  Joe Jackson QMV:[id number] DOB: 12/09/1929 DOA: 10/23/2012  Referring physician: Dr. Nicanor Alcon PCP: Loreen Freud, DO  Specialists: none  Chief Complaint: Dyspnea and cough  HPI: Joe Jackson is a 80 y.o. male  79 year old former smoker with pulmonary fibrosis, he is currently not on home oxygen, mild obstructive sleep apnea, pulmonary Fibrosis with PFTs- 1/09 FVC 60%, ratio 81 - no airway obstructoin!, But started having cough and dyspnea about one week prior to admission when she has primary care doctor who started him on a Z-Pak and steroids remained stable until one day prior to admission he started getting significantly short of breath at rest with a persistent cough. He reports chills at home but he did not check his temperature. He relates he has no PND, orthopnea, dizziness. He relates his cough has progressively gotten worse overnight and so has his shortness of breath. He relates before he could walk more than 100 feet without getting short of breath now he can not even walk to the bathroom without getting short of breath the [facility] a chest x-ray was done that shows pulmonary fibrosis and interstitial marking, a BMP was done that is 700, his saturations been above 90% on 2 L. Temperature was checked that showed 100.2, and his CBC his white count is 11 with a left shift and toxic granulations. Denies any unusual weight loss  Review of Systems: The patient denies anorexia, fever, weight loss,, vision loss, decreased hearing, hoarseness, chest pain, syncope, peripheral edema, balance deficits, hemoptysis, abdominal pain, melena, hematochezia, severe indigestion/heartburn, hematuria, incontinence, genital sores, muscle weakness, , transient blindness, difficulty walking, depression, unusual weight change, abnormal bleeding, enlarged lymph nodes, angioedema, and breast masses.    Past Medical History  Diagnosis Date  . CHF  (congestive heart failure)   . Hypertension   . Hyperlipidemia   . Coronary heart disease     ish CMP 45%, aortic stenosis 1.0 cm 211/08  . Sleep apnea   . Aortic aneurysm, abdominal   . Pulmonary fibrosis   . CHF (congestive heart failure)   . Myocardial infarction   . COPD (chronic obstructive pulmonary disease)    Past Surgical History  Procedure Laterality Date  . Neck surgery    . Arm surgery    . Back surgery    . Leg surgery     Social History:  reports that he quit smoking about 14 months ago. His smoking use included Cigarettes. He has a 62.4 pack-year smoking history. His smokeless tobacco use includes Chew. He reports that he does not drink alcohol or use illicit drugs. Visit home with wife can perform all his ADLs  No Known Allergies  Family History  Problem Relation Age of Onset  . Heart disease Mother     Heart Disease before age  67  . Other Mother     Rheumatism  . Heart attack Mother   . Heart attack Father     Prior to Admission medications   Medication Sig Start Date End Date Taking? Authorizing Provider  aspirin EC 81 MG tablet Take 81 mg by mouth daily.   Yes Historical Provider, MD  azithromycin (ZITHROMAX Z-PAK) 250 MG tablet As directed 10/20/12  Yes Lelon Perla, DO  budesonide-formoterol (SYMBICORT) 160-4.5 MCG/ACT inhaler Inhale 2 puffs into the lungs 2 (two) times daily. 04/14/12  Yes Grayling Congress Lowne, DO  cetirizine (ZYRTEC) 10 MG tablet Take 10 mg by mouth  daily.     Yes Historical Provider, MD  Coenzyme Q10 (CO Q 10 PO) Take 1 tablet by mouth daily.    Yes Historical Provider, MD  Cyanocobalamin (VITAMIN B-12 PO) Take 1 tablet by mouth daily.    Yes Historical Provider, MD  Garlic (ODOR FREE GARLIC) 100 MG TABS Take 1 tablet by mouth daily.     Yes Historical Provider, MD  guaiFENesin (MUCINEX) 600 MG 12 hr tablet Take 1,200 mg by mouth daily.    Yes Historical Provider, MD  ipratropium-albuterol (DUONEB) 0.5-2.5 (3) MG/3ML SOLN Take 3 mLs by  nebulization 4 (four) times daily.    Yes Historical Provider, MD  lisinopril-hydrochlorothiazide (PRINZIDE,ZESTORETIC) 20-12.5 MG per tablet Take 2 tablets by mouth daily. 04/14/12  Yes Lelon Perla, DO  Multiple Vitamin (MULITIVITAMIN WITH MINERALS) TABS Take 1 tablet by mouth daily.   Yes Historical Provider, MD  Omega-3 Fatty Acids (FISH OIL PO) Take 1 capsule by mouth daily.    Yes Historical Provider, MD  predniSONE (DELTASONE) 10 MG tablet 3 po qd for 3 days then 2 po qd for 3 days the 1 po qd for 3 days 10/20/12  Yes Grayling Congress Lowne, DO  atorvastatin (LIPITOR) 40 MG tablet Take 0.5 tablets (20 mg total) by mouth at bedtime. 04/14/12   Lelon Perla, DO  NONFORMULARY OR COMPOUNDED ITEM Penis pump as directed 04/14/12   Lelon Perla, DO   Physical Exam: Filed Vitals:   10/23/12 0454 10/23/12 0600 10/23/12 0615 10/23/12 0630  BP:  110/58 111/54 103/45  Pulse:  91 89 88  Temp:  100.2 F (37.9 C)    TempSrc:  Oral    Resp:  27 24 19   Height:  5\' 9"  (1.753 m)    Weight:  92.6 kg (204 lb 2.3 oz)    SpO2: 94% 95% 93% 96%    BP 103/45  Pulse 88  Temp(Src) 100.2 F (37.9 C) (Oral)  Resp 19  Ht 5\' 9"  (1.753 m)  Wt 92.6 kg (204 lb 2.3 oz)  BMI 30.13 kg/m2  SpO2 96%  General Appearance:    Alert, cooperative, no distress, appears stated age  Head:    Normocephalic, without obvious abnormality, atraumatic  Eyes:    PERRL, conjunctiva/corneas clear, EOM's intact, fundi    benign, both eyes             Throat:   dry mucous membrane   Neck:   thick neck, symmetrical, trachea midline, no adenopathy;       thyroid:  No enlargement/tenderness/nodules; no carotid   bruit or JVD  Back:     Symmetric, no curvature, ROM normal, no CVA tenderness  Lungs:     is very poor air movement, with rhonchi and crackles diffusely. He also has some mild wheezing on the expiratory phase   Chest wall:    No tenderness or deformity  Heart:    Regular rate and rhythm, S1 and S2 normal, no murmur, rub    or gallop  Abdomen:     Soft, non-tender, bowel sounds active all four quadrants,    no masses, no organomegaly        Extremities:   Extremities normal, atraumatic, no cyanosis or edema  Pulses:   2+ and symmetric all extremities  Skin:   has diffuse erythema which is blanchable on his extremities and face and upper chest wall   Lymph nodes:   Cervical, supraclavicular, and axillary nodes normal  Neurologic:  CNII-XII intact. Normal strength, sensation and reflexes      throughout    Labs on Admission:  Basic Metabolic Panel:  Recent Labs Lab 10/23/12 0305  NA 139  K 4.6  CL 101  CO2 26  GLUCOSE 99  BUN 52*  CREATININE 1.60*  CALCIUM 9.8   Liver Function Tests: No results found for this basename: AST, ALT, ALKPHOS, BILITOT, PROT, ALBUMIN,  in the last 168 hours No results found for this basename: LIPASE, AMYLASE,  in the last 168 hours No results found for this basename: AMMONIA,  in the last 168 hours CBC:  Recent Labs Lab 10/23/12 0305  WBC 11.6*  NEUTROABS 9.6*  HGB 12.9*  HCT 40.3  MCV 85.0  PLT 84*   Cardiac Enzymes:  Recent Labs Lab 10/23/12 0305  TROPONINI <0.30    BNP (last 3 results)  Recent Labs  11/09/11 1236 10/23/12 0315  PROBNP 409.3 728.0*   CBG: No results found for this basename: GLUCAP,  in the last 168 hours  Radiological Exams on Admission: Dg Chest Portable 1 View  10/23/2012  *RADIOLOGY REPORT*  Clinical Data: Cough, shortness of breath  PORTABLE CHEST - 1 VIEW  Comparison: 10/20/2012  Findings: Prominent cardiomediastinal contours.  Chronic increased interstitial markings.  Peribronchial thickening.  Bibasilar opacities.  Trace effusions not excluded.  No pneumothorax.  No acute osseous finding.  IMPRESSION: Prominent cardiomediastinal contours, similar to prior.  Increased interstitial markings and peribronchial thickening, at least in part chronic.  A superimposed atypical infection or interstitial edema is not excluded.   Bibasilar opacities; atelectasis versus infiltrate.   Original Report Authenticated By: Jearld Lesch, M.D.     EKG: Independently reviewed. Wondering pacemaker, with a nonspecific interventricular block, normal axis.  Assessment/Plan  Acute and chronic respiratory failure: - Unclear etiology of his dyspnea. When I was in the room and I took off his oxygen he did desat to 88 on room air. This seems unlikely to be acute decompensation of his heart failure, has no lower extremity edema, no appreciated JVDs chest x-ray does not show a crackle and his BNP is only 700 which is appropriate for his age and his chronic renal disease. - He does a slightly elevated temperature 100.2, and white count with a left shift (he was previously on steroids),CBC shows toxic granulation which makes me thinks more of an infectious etiology rather than acute decompensated heart failure. Cycle his cardiac enzymes.  - I will Start him on IV steroids, IV antibiotics albuterol Spiriva, check blood cultures x2. - We'll go ahead and also check a lactic acid and home his blood pressure medication as his blood pressures borderline low.  - He does have a history of heart failure, his last 2-D echo was in 2008. We'll go ahead and check a 2-D echo, to see this IVC is dilated, but is unlikely that this is an acute decompensated heart failure episode. Is chest x-ray is hard to interpret due to to his pulmonary fibrosis. Also this 2-D echo with helpful to evaluate his heart failure she seems to be compensated.  COPD: -  As per above continue IV steroids inhalers and antibiotics. Get sputum cultures x2.  Diffuse skin Erythema: - New blanchable erythema, in his upper extremities head, neck and upper chest and back. Sound like a drug reaction. Started empirically on steroids for his primary pulmonary problem. He recently got rocephin in [facility]. His Bp is borderline low. I will start him empirically on  benadryl and H1  blocker place a foley and monitor vitals.  Dehydration: - We'll go ahead and start him on gentle IV fluids, check strict I.'s and O.'s.  HYPERTENSION - His blood pressure was borderline in home his blood pressure medications (lisinopril and hydrochlorothiazide) as he seems to be intravascularly depleted by physical exam.  Congestive heart failure, unspecified - He is not on a beta blocker at home. And he does have a low ejection fraction by 2-D echo in 2008. A right and start him on low-dose beta blockers once he hasn't improved and once we have repeated a 2-D echo.  Leukocytosis, unspecified: - This most likely secondary to infectious etiology, blood cultures and sputum cultures were drawn. We have started her on Levaquin.  Thrombocytopenia: - Chronic unclear etiology will need further followup as an outpatient to  Code Status: full Family Communication: wife Disposition Plan: step-down  Time spent: 65 minutes  Marinda Elk [facility] Pager [phone]  If 7PM-7AM, please contact night-coverage [email or web address] Password [facility] 10/23/2012, 7:54 AM

## 2012-10-23 NOTE — ED Notes (Signed)
Pt c/o cough and shob worsening tonight. Pt is currently on abx for bronchitis.

## 2012-10-23 NOTE — Progress Notes (Signed)
  Echocardiogram 2D Echocardiogram has been performed.  Joe Jackson Joe Jackson 10/23/2012, 2:14 PM

## 2012-10-23 NOTE — ED Provider Notes (Signed)
History     CSN: [id number]  Arrival date & time 10/23/12  0244   First MD Initiated Contact with Patient 10/23/12 0251      Chief Complaint  Patient presents with  . Cough  . Shortness of Breath    (Consider location/radiation/quality/duration/timing/severity/associated sxs/prior treatment) Patient is a 79 y.o. male presenting with shortness of breath. The history is provided by the patient and the spouse. No language interpreter was used.  Shortness of Breath Severity:  Severe Onset quality:  Gradual Timing:  Constant Progression:  Worsening Chronicity:  New Context: not fumes   Relieved by:  Nothing Worsened by:  Nothing tried Ineffective treatments:  None tried Associated symptoms: cough and wheezing   Associated symptoms: no abdominal pain, no fever and no hemoptysis   Cough:    Cough characteristics:  Non-productive   Severity:  Moderate   Onset quality:  Gradual   Timing:  Constant   Progression:  Worsening   Chronicity:  New Wheezing:    Severity:  Moderate   Onset quality:  Gradual Risk factors: no tobacco use   Seen for COPD at PMD office this week and started on steroids and zpak.  Today worsening SOB and dry cough worse tonight.    Past Medical History  Diagnosis Date  . CHF (congestive heart failure)   . Hypertension   . Hyperlipidemia   . Coronary heart disease     ish CMP 45%, aortic stenosis 1.0 cm 211/08  . Sleep apnea   . Aortic aneurysm, abdominal   . Pulmonary fibrosis   . CHF (congestive heart failure)   . Myocardial infarction   . COPD (chronic obstructive pulmonary disease)     Past Surgical History  Procedure Laterality Date  . Neck surgery    . Arm surgery    . Back surgery    . Leg surgery      Family History  Problem Relation Age of Onset  . Heart disease Mother     Heart Disease before age  66  . Other Mother     Rheumatism  . Heart attack Mother   . Heart attack Father     History  Substance Use Topics  .  Smoking status: Former Smoker -- 0.80 packs/day for 78 years    Types: Cigarettes    Quit date: 08/05/2011  . Smokeless tobacco: Current User    Types: Chew    Last Attempt to Quit: 08/11/2011  . Alcohol Use: No      Review of Systems  Constitutional: Negative for fever.  Respiratory: Positive for cough, shortness of breath and wheezing. Negative for hemoptysis.   Gastrointestinal: Negative for abdominal pain.  All other systems reviewed and are negative.    Allergies  Review of patient's allergies indicates no known allergies.  Home Medications   Current Outpatient Rx  Name  Route  Sig  Dispense  Refill  . aspirin EC 81 MG tablet   Oral   Take 81 mg by mouth daily.         Marland Kitchen atorvastatin (LIPITOR) 40 MG tablet   Oral   Take 0.5 tablets (20 mg total) by mouth at bedtime.   45 tablet   3   . azithromycin (ZITHROMAX Z-PAK) 250 MG tablet      As directed   6 each   0   . budesonide-formoterol (SYMBICORT) 160-4.5 MCG/ACT inhaler   Inhalation   Inhale 2 puffs into the lungs 2 (two) times daily.  1 Inhaler   11   . cetirizine (ZYRTEC) 10 MG tablet   Oral   Take 10 mg by mouth daily.           . Coenzyme Q10 (CO Q 10 PO)   Oral   Take 1 tablet by mouth daily.          . Cyanocobalamin (VITAMIN B-12 PO)   Oral   Take 1 tablet by mouth daily.          . Garlic (ODOR FREE GARLIC) 100 MG TABS   Oral   Take 1 tablet by mouth daily.           Marland Kitchen guaiFENesin (MUCINEX) 600 MG 12 hr tablet   Oral   Take 600 mg by mouth daily.           Marland Kitchen ipratropium-albuterol (DUONEB) 0.5-2.5 (3) MG/3ML SOLN   Nebulization   Take 3 mLs by nebulization 4 (four) times daily.          Marland Kitchen lisinopril-hydrochlorothiazide (PRINZIDE,ZESTORETIC) 20-12.5 MG per tablet   Oral   Take 2 tablets by mouth daily.   180 tablet   3   . Multiple Vitamin (MULITIVITAMIN WITH MINERALS) TABS   Oral   Take 1 tablet by mouth daily.         . NONFORMULARY OR COMPOUNDED ITEM       Penis pump as directed   1 each   0   . Omega-3 Fatty Acids (FISH OIL PO)   Oral   Take 1 capsule by mouth daily.          . predniSONE (DELTASONE) 10 MG tablet      3 po qd for 3 days then 2 po qd for 3 days the 1 po qd for 3 days   18 tablet   0     BP 147/65  Pulse 97  Temp(Src) 99.9 F (37.7 C) (Oral)  Ht 5\' 9"  (1.753 m)  Wt 210 lb (95.255 kg)  BMI 31 kg/m2  SpO2 92%  Physical Exam  Constitutional: He is oriented to person, place, and time. He appears well-developed and well-nourished.  HENT:  Head: Normocephalic and atraumatic.  Eyes: Conjunctivae are normal. Pupils are equal, round, and reactive to light.  Neck: Normal range of motion. Neck supple.  Cardiovascular: Normal rate, regular rhythm and intact distal pulses.   Pulmonary/Chest: No stridor. He has rhonchi. He has no rales. He exhibits no tenderness.  Abdominal: Soft. Bowel sounds are normal. There is no tenderness. There is no rebound and no guarding.  Musculoskeletal: Normal range of motion.  Neurological: He is alert and oriented to person, place, and time.  Skin: Skin is warm and dry.  Psychiatric: He has a normal mood and affect.    ED Course  Procedures (including critical care time)  Labs Reviewed  CBC WITH DIFFERENTIAL  BASIC METABOLIC PANEL  TROPONIN I   No results found.   No diagnosis found.    MDM   Date: 10/23/2012  Rate: 97  Rhythm: normal sinus rhythm  QRS Axis: normal  Intervals: normal  ST/T Wave abnormalities: nonspecific ST changes  Conduction Disutrbances:none  Narrative Interpretation:   Old EKG Reviewed: none available   Will admit to step down.  COntinue nebs.         Jasmine Awe, MD 10/23/12 [phone]

## 2012-10-24 ENCOUNTER — Encounter (HOSPITAL_COMMUNITY): Payer: Self-pay | Admitting: Nurse Practitioner

## 2012-10-24 DIAGNOSIS — I255 Ischemic cardiomyopathy: Secondary | ICD-10-CM | POA: Diagnosis present

## 2012-10-24 DIAGNOSIS — I480 Paroxysmal atrial fibrillation: Secondary | ICD-10-CM

## 2012-10-24 DIAGNOSIS — J8410 Pulmonary fibrosis, unspecified: Secondary | ICD-10-CM | POA: Diagnosis present

## 2012-10-24 DIAGNOSIS — I4891 Unspecified atrial fibrillation: Secondary | ICD-10-CM

## 2012-10-24 DIAGNOSIS — I5042 Chronic combined systolic (congestive) and diastolic (congestive) heart failure: Secondary | ICD-10-CM

## 2012-10-24 DIAGNOSIS — I2510 Atherosclerotic heart disease of native coronary artery without angina pectoris: Secondary | ICD-10-CM | POA: Insufficient documentation

## 2012-10-24 DIAGNOSIS — J449 Chronic obstructive pulmonary disease, unspecified: Secondary | ICD-10-CM | POA: Insufficient documentation

## 2012-10-24 DIAGNOSIS — E785 Hyperlipidemia, unspecified: Secondary | ICD-10-CM | POA: Diagnosis present

## 2012-10-24 DIAGNOSIS — I2589 Other forms of chronic ischemic heart disease: Secondary | ICD-10-CM

## 2012-10-24 DIAGNOSIS — I10 Essential (primary) hypertension: Secondary | ICD-10-CM | POA: Insufficient documentation

## 2012-10-24 LAB — CBC
MCH: 26.8 pg (ref 26.0–34.0)
MCHC: 32.5 g/dL (ref 30.0–36.0)
MCV: 82.4 fL (ref 78.0–100.0)
Platelets: 84 10*3/uL — ABNORMAL LOW (ref 150–400)
RBC: 4.26 MIL/uL (ref 4.22–5.81)

## 2012-10-24 LAB — COMPREHENSIVE METABOLIC PANEL
AST: 16 U/L (ref 0–37)
CO2: 23 mEq/L (ref 19–32)
Calcium: 8.7 mg/dL (ref 8.4–10.5)
Creatinine, Ser: 1.68 mg/dL — ABNORMAL HIGH (ref 0.50–1.35)
GFR calc Af Amer: 42 mL/min — ABNORMAL LOW (ref >90)
GFR calc non Af Amer: 37 mL/min — ABNORMAL LOW (ref >90)
Glucose, Bld: 243 mg/dL — ABNORMAL HIGH (ref 70–99)
Sodium: 139 mEq/L (ref 135–145)
Total Protein: 6.9 g/dL (ref 6.0–8.3)

## 2012-10-24 LAB — MAGNESIUM: Magnesium: 2 mg/dL (ref 1.5–2.5)

## 2012-10-24 LAB — HEPARIN LEVEL (UNFRACTIONATED): Heparin Unfractionated: 0.39 IU/mL (ref 0.30–0.70)

## 2012-10-24 MED ORDER — LEVALBUTEROL HCL 0.63 MG/3ML IN NEBU: 0.6300 mg | INHALATION_SOLUTION | Freq: Four times a day (QID) | RESPIRATORY_TRACT | Status: DC | PRN

## 2012-10-24 MED ORDER — LEVALBUTEROL HCL 0.63 MG/3ML IN NEBU: 0.6300 mg | INHALATION_SOLUTION | RESPIRATORY_TRACT | Status: DC | PRN

## 2012-10-24 MED ORDER — FAMOTIDINE 40 MG PO TABS
40.0000 mg | ORAL_TABLET | Freq: Two times a day (BID) | ORAL | Status: DC
  Administered 2012-10-24: 40 mg via ORAL
  Filled 2012-10-24 (×2): qty 1

## 2012-10-24 MED ORDER — WARFARIN - PHARMACIST DOSING INPATIENT
Freq: Every day | Status: DC
  Administered 2012-10-25: 18:00:00

## 2012-10-24 MED ORDER — LEVALBUTEROL HCL 0.63 MG/3ML IN NEBU
0.6300 mg | INHALATION_SOLUTION | RESPIRATORY_TRACT | Status: DC
  Administered 2012-10-24 (×3): 0.63 mg via RESPIRATORY_TRACT
  Filled 2012-10-24 (×7): qty 3

## 2012-10-24 MED ORDER — HEPARIN (PORCINE) IN NACL 100-0.45 UNIT/ML-% IJ SOLN
1400.0000 [IU]/h | INTRAMUSCULAR | Status: DC
  Administered 2012-10-24 – 2012-10-25 (×2): 1300 [IU]/h via INTRAVENOUS
  Filled 2012-10-24 (×3): qty 250

## 2012-10-24 MED ORDER — LEVOFLOXACIN IN D5W 750 MG/150ML IV SOLN
750.0000 mg | INTRAVENOUS | Status: DC
  Administered 2012-10-26: 750 mg via INTRAVENOUS
  Filled 2012-10-24: qty 150

## 2012-10-24 MED ORDER — DILTIAZEM HCL 30 MG PO TABS
30.0000 mg | ORAL_TABLET | Freq: Four times a day (QID) | ORAL | Status: DC
  Administered 2012-10-24 – 2012-10-26 (×9): 30 mg via ORAL
  Filled 2012-10-24 (×12): qty 1

## 2012-10-24 MED ORDER — LEVALBUTEROL HCL 0.63 MG/3ML IN NEBU
0.6300 mg | INHALATION_SOLUTION | Freq: Four times a day (QID) | RESPIRATORY_TRACT | Status: DC
  Administered 2012-10-25 (×3): 0.63 mg via RESPIRATORY_TRACT
  Filled 2012-10-24 (×5): qty 3

## 2012-10-24 MED ORDER — WARFARIN SODIUM 7.5 MG PO TABS
7.5000 mg | ORAL_TABLET | Freq: Once | ORAL | Status: AC
  Administered 2012-10-24: 7.5 mg via ORAL
  Filled 2012-10-24: qty 1

## 2012-10-24 MED ORDER — DIGOXIN 125 MCG PO TABS: 0.1250 mg | ORAL_TABLET | Freq: Every day | ORAL | Status: DC

## 2012-10-24 MED ORDER — PANTOPRAZOLE SODIUM 40 MG PO TBEC
40.0000 mg | DELAYED_RELEASE_TABLET | Freq: Every day | ORAL | Status: DC
  Administered 2012-10-25: 40 mg via ORAL
  Filled 2012-10-24: qty 1

## 2012-10-24 MED ORDER — LEVALBUTEROL HCL 0.63 MG/3ML IN NEBU
0.6300 mg | INHALATION_SOLUTION | Freq: Four times a day (QID) | RESPIRATORY_TRACT | Status: DC
  Administered 2012-10-24: 0.63 mg via RESPIRATORY_TRACT
  Filled 2012-10-24: qty 3

## 2012-10-24 MED ORDER — DIGOXIN 0.25 MG/ML IJ SOLN
0.2500 mg | Freq: Four times a day (QID) | INTRAMUSCULAR | Status: AC
Start: 2012-10-24 — End: 2012-10-24
  Administered 2012-10-24 (×2): 0.25 mg via INTRAVENOUS
  Filled 2012-10-24 (×3): qty 1

## 2012-10-24 NOTE — Progress Notes (Signed)
[facility] progress note. Chief complaint. Tachycardia. History of present illness. This 79 year old male in hospital with acute on chronic respiratory failure. His cardiac history includes congestive heart failure, coronary artery disease, hypertension, hyperlipidemia, prior MI. Is not currently on any antihypertensive medications but at home was on lisinopril/HCTZ. Nursing noted that the patient tachycardic requested a 12-lead EKG be obtained. The EKG has resulted and this indicates atrial fib with rapid ventricular response. I came to see the patient at bedside. He has no specific complaints but especially denies chest pain he denies any known history of prior atrial fib or other arrhythmia states he has had some problems with bradycardia in the past. Vital signs. Temperature 97.9, pulse 120 to 130, respiration 19, blood pressure 109/68. O2 sats 94% General appearance. Some moderate with obese elderly male who is alert, cooperative, and in no distress. Cardiac. Tachycardic and irregular. Lungs. Some rhonchi in the bases. No distress and stable O2 sats. Abdomen. Soft and obese with positive bowel sounds. No pain. Impression/plan. Problem #1 atrial fib with rapid ventricular response. Blood pressure is currently 99/58 as I will defer using calcium channel blocker or beta blocker for rate control. Instead I have elected to initiate digoxin load with 0.25 mg IV now and repeat 0.25 mg IV in 6 hours. Tomorrow he can be started on by mouth digoxin 0.125 mg daily. A digoxin level will have to be obtained as well. Patient currently stable per bedside evaluation.

## 2012-10-24 NOTE — Progress Notes (Signed)
ANTICOAGULATION CONSULT NOTE  Pharmacy Consult for Heparin Indication: atrial fibrillation  No Known Allergies  Patient Measurements: Height: 5\' 9"  (175.3 cm) Weight: 202 lb 2.6 oz (91.7 kg) IBW/kg (Calculated) : 70.7 Heparin Dosing Weight: 87kg   Vital Signs: Temp: 98.8 F (37.1 C) (03/23 2100) Temp src: Oral (03/23 2100) BP: 136/58 mmHg (03/23 2100) Pulse Rate: 85 (03/23 2100)  Labs:  Recent Labs  10/23/12 0305 10/23/12 0805 10/23/12 1400 10/23/12 1816 10/24/12 0445 10/24/12 2143  HGB 12.9* 12.0*  --   --  11.4*  --   HCT 40.3 36.2*  --   --  35.1*  --   PLT 84* 77*  --   --  84*  --   HEPARINUNFRC  --   --   --   --   --  0.39  CREATININE 1.60* 1.76*  --   --  1.68*  --   TROPONINI <0.30 <0.30 <0.30 <0.30  --   --     Estimated Creatinine Clearance: 38.6 ml/min (by C-G formula based on Cr of 1.68).  Assessment: 79 yo male with fib for heparin  Goal of Therapy:  Heparin level 0.3-0.7 units/ml Monitor platelets by anticoagulation protocol: Yes   Plan:  Continue Heparin at current rate  Follow-up am labs.  Geannie Risen, PharmD, BCPS   10/24/2012 10:33 PM

## 2012-10-24 NOTE — Evaluation (Signed)
Physical Therapy Evaluation Patient Details Name: Joe Jackson MRN: [id number] DOB: January 25, 1930 Today's Date: 10/24/2012 Time: 1421-1440 PT Time Calculation (min): 19 min  PT Assessment / Plan / Recommendation Clinical Impression  Patient is an 79 yo male admitted with SOB and A-fib with RVR.  Patient did well with mobility.  Has general weakness and slight decrease in balance impacting functional mobility.  Will benefit from acute PT to maximize independence prior to return home with wife.  Do not anticipate any f/u PT needs.    PT Assessment  Patient needs continued PT services    Follow Up Recommendations  No PT follow up;Supervision - Intermittent    Does the patient have the potential to tolerate intense rehabilitation      Barriers to Discharge None      Equipment Recommendations  None recommended by PT    Recommendations for Other Services     Frequency Min 3X/week    Precautions / Restrictions Precautions Precautions: None Restrictions Weight Bearing Restrictions: No   Pertinent Vitals/Pain      Mobility  Bed Mobility Bed Mobility: Rolling Right;Right Sidelying to Sit;Sit to Supine Rolling Right: 4: Min guard;With rail Right Sidelying to Sit: 4: Min assist;HOB elevated Sit to Supine: 4: Min guard;HOB elevated Details for Bed Mobility Assistance: Verbal cues for technique.  Assist to raise trunk off of bed.  Cues to move more slowly for safety. Transfers Transfers: Sit to Stand;Stand to Sit Sit to Stand: 4: Min guard;With upper extremity assist;From bed Stand to Sit: 4: Min guard;With upper extremity assist;To bed Details for Transfer Assistance: Cues to move more slowly through transitions.  Assist for safety only. Ambulation/Gait Ambulation/Gait Assistance: 4: Min assist Ambulation Distance (Feet): 132 Feet Assistive device: 1 person hand held assist Ambulation/Gait Assistance Details: Verbal cues to move at slower pace for safety and to minimize dyspnea  and HR.  Hand-hold assist for balance and safety. Gait Pattern: Within Functional Limits Gait velocity: At times, too fast.  Cues to slow down for safety General Gait Details: Ambulated with O2 at 4 l/min.  O2 sat at 97% with HR at 101.    Exercises     PT Diagnosis: Abnormality of gait;Generalized weakness  PT Problem List: Decreased strength;Decreased balance;Decreased mobility;Cardiopulmonary status limiting activity PT Treatment Interventions: Gait training;Functional mobility training;Balance training;Patient/family education   PT Goals Acute Rehab PT Goals PT Goal Formulation: With patient Time For Goal Achievement: 10/31/12 Potential to Achieve Goals: Good Pt will go Supine/Side to Sit: Independently;with HOB 0 degrees PT Goal: Supine/Side to Sit - Progress: Goal set today Pt will go Sit to Stand: Independently;with upper extremity assist PT Goal: Sit to Stand - Progress: Goal set today Pt will Ambulate: >150 feet;Independently (without loss of balance) PT Goal: Ambulate - Progress: Goal set today  Visit Information  Last PT Received On: 10/24/12 Assistance Needed: +1 (+1 for lines)    Subjective Data  Subjective: "We could 2-step down the hallway if you want"  Patient joking with therapist during session. Patient Stated Goal: To go home soon.   Prior Functioning  Home Living Lives With: Spouse Available Help at Discharge: Family;Friend(s);Available 24 hours/day Type of Home: House Home Access: Stairs to enter [employer] of Steps: 1 Entrance Stairs-Rails: None Home Layout: One level (With step to enter living area) Bathroom Shower/Tub: Engineer, manufacturing systems: Standard Bathroom Accessibility: Yes How Accessible: Accessible via walker Home Adaptive Equipment: Tub transfer bench;Walker - four wheeled;Grab bars in shower (Lift chair) Prior Function Level  of Independence: Independent Able to Take Stairs?: Yes Driving: Yes Vocation:  Retired Comments: Does yard work, goes Nurse, adult - very active Musician: No difficulties    Copywriter, advertising Overall Cognitive Status: Appears within functional limits for tasks assessed/performed Arousal/Alertness: Awake/alert Orientation Level: Appears intact for tasks assessed Behavior During Session: [facility] for tasks performed    Extremity/Trunk Assessment Right Upper Extremity Assessment RUE ROM/Strength/Tone: WFL for tasks assessed RUE Sensation: WFL - Light Touch Left Upper Extremity Assessment LUE ROM/Strength/Tone: WFL for tasks assessed LUE Sensation: WFL - Light Touch Right Lower Extremity Assessment RLE ROM/Strength/Tone: WFL for tasks assessed RLE Sensation: WFL - Light Touch Left Lower Extremity Assessment LLE ROM/Strength/Tone: WFL for tasks assessed LLE Sensation: WFL - Light Touch Trunk Assessment Trunk Assessment: Normal   Balance    End of Session PT - End of Session Equipment Utilized During Treatment: Gait belt;Oxygen Activity Tolerance: Patient tolerated treatment well Patient left: in bed;with call bell/phone within reach;with nursing in room;with family/visitor present Nurse Communication: Mobility status  GP     Vena Austria 10/24/2012, 6:02 PM Durenda Hurt. Renaldo Fiddler, [facility] Acute Rehab Services Pager [phone]

## 2012-10-24 NOTE — Progress Notes (Signed)
ANTICOAGULATION CONSULT NOTE - Initial Consult  Pharmacy Consult for Heparin/Coumadin  Indication: atrial fibrillation  No Known Allergies  Patient Measurements: Height: 5\' 9"  (175.3 cm) Weight: 202 lb 2.6 oz (91.7 kg) IBW/kg (Calculated) : 70.7 Heparin Dosing Weight: 87kg   Vital Signs: Temp: 97.6 F (36.4 C) (03/23 1100) Temp src: Oral (03/23 1100) BP: 114/84 mmHg (03/23 0700) Pulse Rate: 107 (03/23 0700)  Labs:  Recent Labs  10/23/12 0305 10/23/12 0805 10/23/12 1400 10/23/12 1816 10/24/12 0445  HGB 12.9* 12.0*  --   --  11.4*  HCT 40.3 36.2*  --   --  35.1*  PLT 84* 77*  --   --  84*  CREATININE 1.60* 1.76*  --   --  1.68*  TROPONINI <0.30 <0.30 <0.30 <0.30  --     Estimated Creatinine Clearance: 38.6 ml/min (by C-G formula based on Cr of 1.68).   Medical History: Past Medical History  Diagnosis Date  . Hypertension   . Hyperlipidemia   . Chronic combined systolic and diastolic CHF (congestive heart failure)     a. 10/2012 Echo: EF 40-45%, Gr1 dd, postlat AK, mild to mod AS, Triv AI, Mild MR, mildly dil LA.  . Sleep apnea   . Aortic aneurysm, abdominal   . Pulmonary fibrosis   . Ischemic cardiomyopathy     a. EF 40-45% by echo 10/2012  . CAD (coronary artery disease)     a. s/p prior MI;  b. 11/1999 Cath: LM nl, LAD 29m, LCX min irregs, OM 100, RCA 80p, 100d, L->L and L->R collats, EF 54%.  Marland Kitchen COPD (chronic obstructive pulmonary disease)   . PAF (paroxysmal atrial fibrillation)     a. Dx 10/2012 during hosp for resp failure/copd flare   Assessment: 83 yom admitted 3/22 for acute on chronic respiratory failure now to be started on heparin/warfarin bridge D#1 for new onset Afib. Hgb slight drop to 11.4<<12.0, plts low 84<<77 (spoke w/ NP who stated that pt has a hx of thrombocytopenia; he wants to continue heparin+coumadin and watch platelets). Coumadin points=6. Heparin weight= 87kg.  Goal of Therapy:  Heparin level 0.3-0.7 units/ml Monitor platelets by  anticoagulation protocol: Yes   Plan:  - Heparin 5,000 units sq for dvt px d/c'd - No heparin bolus due to elderly and low platelet count  - Start heparin gtt 1300 units/hr - F/U HL in 8 hours (2200) - Start Coumadin 7.5mg  x 1 dose tonight - F/u Daily INR - Monitor for signs and symptoms of bleeding   Thank you, Franchot Erichsen, Pharm.D. Clinical Pharmacist   Pager: [phone] 10/24/2012 1:45 PM

## 2012-10-24 NOTE — Progress Notes (Signed)
Pt. Refuses CPAP at this time. Pt. States that he doesn't wear CPAP at home & has never been diagnosed with OSA. Pt. Was made aware to call anytime during the night if he changed his mind.

## 2012-10-24 NOTE — Progress Notes (Signed)
[facility] Progress Note [place] TEAM 1 - Stepdown/ICU TEAM   Joe Jackson ZOX:[id number] DOB: [birth year] DOA: 10/23/2012 PCP: Loreen Freud, DO  Brief narrative: 79 year old former smoker with pulmonary fibrosis currently not on home oxygen, mild obstructive sleep apnea, PFTs 1/09 FVC 60%, ratio 81 - no airway obstruction.  Started having cough and dyspnea about one week prior to admission.  His primary care doctor started him on a Z-Pak and steroids.  He remained stable until one day prior to admission when he started getting significantly short of breath at rest with a persistent cough. He reported chills at home but did not check his temperature. He related he had no PND, orthopnea, dizziness. He went to [facility] - a chest x-ray was done that revealed pulmonary fibrosis and interstitial markings - a BNP was 700, his saturations were above 90% on 2 L. Temperature was 100.2, and white count was 11 with a left shift and toxic granulations.   Assessment/Plan:  Acute on chronic respiratory failure Felt to be due to COPD exacerbation in the setting of pulmonary fibrosis - continue medical therapy and follow closely clinically  Acute onset/newly diagnosed atrial fibrillation As per cardiology  Drug rash - allergic reaction to Rocephin? much improved - rash has resolved   HYPERTENSION well-controlled at the present time - episodes of hypotension noted last night have resolved   Congestive heart failure - ischemic cardiomyopathy appears well compensated at present - EF 40-45% by echo 10/2012   History of known coronary artery disease As per cardiology  COPD - 63 pack-year smoking history as noted above   Thrombocytopenia platelet count noted to be 140 in 2010 - 133 in 2013 - and now 84 at time of presentation - anticoagulation was being held - cardiology has requested a hematology consultation   Hyperlipidemia  Aortic abdominal aneurysm  Mild to moderate aortic  stenosis  Code Status: FULL Family Communication: spoke w/ pt and wife at bedside Disposition Plan: SDU  Consultants: Cardiology   Procedures: None   Antibiotics: Levaquin 3/22 >>  DVT prophylaxis: Heparin   HPI/Subjective: The patient is feeling somewhat better the time of my evaluation.  He states that his shortness of breath has improved.  He denies chest pain fevers chills nausea or vomiting.  Objective: Blood pressure 114/84, pulse 107, temperature 97.4 F (36.3 C), temperature source Oral, resp. rate 19, height 5\' 9"  (1.753 m), weight 91.7 kg (202 lb 2.6 oz), SpO2 98.00%.  Intake/Output Summary (Last 24 hours) at 10/24/12 1007 Last data filed at 10/24/12 0714  Gross per 24 hour  Intake 1992.5 ml  Output   1850 ml  Net  142.5 ml   Exam: General: No acute respiratory distress at rest  Lungs: scattered diffuse wheezes with poor air movement throughout all fields with no focal crackles but faint diffuse crackles appreciable  Cardiovascular: irregularly irregular without gallop or rub - 2/6 holosystolic murmur  Abdomen:  morbidly obese, nontender, nondistended, soft, bowel sounds positive, no rebound, no ascites, no appreciable mass Extremities: No significant cyanosis, clubbing, 1+ edema bilateral lower extremities  Data Reviewed: Basic Metabolic Panel:  Recent Labs Lab 10/23/12 0305 10/23/12 0805 10/24/12 0445  NA 139  --  139  K 4.6  --  4.6  CL 101  --  103  CO2 26  --  23  GLUCOSE 99  --  243*  BUN 52*  --  57*  CREATININE 1.60* 1.76* 1.68*  CALCIUM 9.8  --  8.7   Liver Function Tests:  Recent Labs Lab 10/24/12 0445  AST 16  ALT 15  ALKPHOS 80  BILITOT 0.3  PROT 6.9  ALBUMIN 3.0*   CBC:  Recent Labs Lab 10/23/12 0305 10/23/12 0805 10/24/12 0445  WBC 11.6* 14.2* 11.5*  NEUTROABS 9.6*  --   --   HGB 12.9* 12.0* 11.4*  HCT 40.3 36.2* 35.1*  MCV 85.0 81.7 82.4  PLT 84* 77* 84*   Cardiac Enzymes:  Recent Labs Lab 10/23/12 0305  10/23/12 0805 10/23/12 1400 10/23/12 1816  TROPONINI <0.30 <0.30 <0.30 <0.30   BNP (last 3 results)  Recent Labs  11/09/11 1236 10/23/12 0315  PROBNP 409.3 728.0*     Recent Results (from the past 240 hour(s))  CULTURE, BLOOD (ROUTINE X 2)     Status: None   Collection Time    10/23/12  3:35 AM      Result Value Range Status   Specimen Description BLOOD RIGHT HAND   Final   Special Requests     Final   Value: BOTTLES DRAWN AEROBIC AND ANAEROBIC AER 7cc ANA 5cc   Culture  Setup Time 10/23/2012 06:50   Final   Culture     Final   Value:        BLOOD CULTURE RECEIVED NO GROWTH TO DATE CULTURE WILL BE HELD FOR 5 DAYS BEFORE ISSUING A FINAL NEGATIVE REPORT   Report Status PENDING   Incomplete  CULTURE, BLOOD (ROUTINE X 2)     Status: None   Collection Time    10/23/12  3:35 AM      Result Value Range Status   Specimen Description BLOOD LEFT ANTECUBITAL   Final   Special Requests     Final   Value: BOTTLES DRAWN AEROBIC AND ANAEROBIC AER 10cc ANA 5cc   Culture  Setup Time 10/23/2012 06:50   Final   Culture     Final   Value:        BLOOD CULTURE RECEIVED NO GROWTH TO DATE CULTURE WILL BE HELD FOR 5 DAYS BEFORE ISSUING A FINAL NEGATIVE REPORT   Report Status PENDING   Incomplete  MRSA PCR SCREENING     Status: None   Collection Time    10/23/12  5:49 AM      Result Value Range Status   MRSA by PCR NEGATIVE  NEGATIVE Final   Comment:            The GeneXpert MRSA Assay (FDA     approved for NASAL specimens     only), is one component of a     comprehensive MRSA colonization     surveillance program. It is not     intended to diagnose MRSA     infection nor to guide or     monitor treatment for     MRSA infections.  CULTURE, EXPECTORATED SPUTUM-ASSESSMENT     Status: None   Collection Time    10/23/12  6:46 PM      Result Value Range Status   Specimen Description SPUTUM   Final   Special Requests NONE   Final   Sputum evaluation     Final   Value: THIS SPECIMEN IS  ACCEPTABLE. RESPIRATORY CULTURE REPORT TO FOLLOW.   Report Status 10/23/2012 FINAL   Final     Studies:  Recent x-ray studies have been reviewed in detail by the Attending Physician  Scheduled Meds:  Scheduled Meds: . aspirin EC  81 mg Oral Daily  .  atorvastatin  20 mg Oral QHS  . budesonide-formoterol  2 puff Inhalation Q12H  . [START ON 10/25/2012] digoxin  0.125 mg Oral Daily  . famotidine (PEPCID) IV  20 mg Intravenous Q12H  . heparin  5,000 Units Subcutaneous Q8H  . levalbuterol  0.63 mg Nebulization Q4H  . levofloxacin (LEVAQUIN) IV  500 mg Intravenous Q24H  . methylPREDNISolone (SOLU-MEDROL) injection  80 mg Intravenous Q6H  . omega-3 acid ethyl esters  1 g Oral BID  . sodium chloride  3 mL Intravenous Q12H  . tiotropium  18 mcg Inhalation Daily   Continuous Infusions: . sodium chloride 10 mL/hr at 10/24/12 9528    Time spent on care of this patient:   [facility] T  [facility] Office  [phone] Pager - Text Page per Loretha Stapler as per below:  On-Call/Text Page:      [email or web address]      password [email or web address]  If 7PM-7AM, please contact night-coverage [email or web address] Password [email or web address] 10/24/2012, 10:07 AM   LOS: 1 day

## 2012-10-24 NOTE — Consult Note (Addendum)
CARDIOLOGY CONSULT NOTE  Patient ID: Joe Jackson MRN: [id number], DOB/AGE: 1930/04/12   Admit date: 10/23/2012 Date of Consult: 10/24/2012  Primary Physician: Loreen Freud, DO Primary Cardiologist: T. Riley Kill, MD  Pt. Profile  79 year old male with prior history of CAD and ischemic cardiomyopathy who was admitted with acute on chronic respiratory failure and went into atrial fibrillation last night.  Problem List  Past Medical History  Diagnosis Date  . Hypertension   . Hyperlipidemia   . Chronic combined systolic and diastolic CHF (congestive heart failure)     a. 10/2012 Echo: EF 40-45%, Gr1 dd, postlat AK, mild to mod AS, Triv AI, Mild MR, mildly dil LA.  . Sleep apnea   . Aortic aneurysm, abdominal   . Pulmonary fibrosis   . Ischemic cardiomyopathy     a. EF 40-45% by echo 10/2012  . CAD (coronary artery disease)     a. s/p prior MI;  b. 11/1999 Cath: LM nl, LAD 22m, LCX min irregs, OM 100, RCA 80p, 100d, L->L and L->R collats, EF 54%.  Marland Kitchen COPD (chronic obstructive pulmonary disease)   . PAF (paroxysmal atrial fibrillation)     a. Dx 10/2012 during hosp for resp failure/copd flare    Past Surgical History  Procedure Laterality Date  . Neck surgery    . Arm surgery    . Back surgery    . Leg surgery      Allergies  No Known Allergies  HPI   79 year old male with the above complex problem list. He has a history of coronary artery disease dating back many years with known occlusions to his obtuse marginal and right coronary artery. He also has a history of ischemic cardiomyopathy with an EF in the 45% range dating back sometime. He is followed closely and pulmonology clinic for history of pulmonary fibrosis and COPD. He quit smoking in January 2013.  He has been doing reasonably well over the years he remains active around his home and in his yard. He frequently uses a chain saw and also has goats. He was in his usual state of health until about 8 days ago when he began  to experience intermittent chills, worsening dyspnea on exertion, and nonproductive cough. These symptoms worsened over a period of about 3 or 4 days and he was seen by his primary care provider last Wednesday and treated with steroid injection, prednisone taper, an oral antibiotic. Over the subsequent 2 days, he was feeling better and cut some wood on Thursday and even went dancing on Friday. When he came home from dancing Friday evening, he had a coughing fit and his breathing became more labored. By Saturday morning, his breathing was worse and his wife brought him to the med center at [facility]. There, he had a low-grade fever of 100.2. He was hypoxic with saturations in the 80s to 90s. Chest x-ray showed pulmonary fibrosis and interstitial markings with question of atypical infection versus edema. BNP was approximately 700. White count was elevated. History with IV Rocephin in the ED there which resulted in swelling of his face and lips. He was subsequently treated with steroids and transferred [facility] evaluation. Here, he has been treated with antibiotics, steroids, and inhalers with some symptomatic improvement.  During the night, he converted from sinus rhythm to atrial fibrillation with rapid response. He was asymptomatic. He was hypotensive as well however and he was placed on IV digoxin with some improvement in rate control. We've been asked  to evaluate. He remains in atrial fibrillation and with rates in the low 100s to 1-teens.  He is currently asymptomatic and denies palpitations, presyncope, or chest pain. His dyspnea has improved and he is not currently dyspneic at rest. He has no prior history of atrial fibrillation and denies any prior history of palpitations, presyncope, or syncope.   Inpatient Medications  . aspirin EC  81 mg Oral Daily  . atorvastatin  20 mg Oral QHS  . budesonide-formoterol  2 puff Inhalation Q12H  . [START ON 10/25/2012] digoxin  0.125 mg Oral Daily  .  famotidine  40 mg Oral BID  . heparin  5,000 Units Subcutaneous Q8H  . levalbuterol  0.63 mg Nebulization Q4H  . levofloxacin (LEVAQUIN) IV  500 mg Intravenous Q24H  . methylPREDNISolone (SOLU-MEDROL) injection  80 mg Intravenous Q6H  . omega-3 acid ethyl esters  1 g Oral BID  . sodium chloride  3 mL Intravenous Q12H  . tiotropium  18 mcg Inhalation Daily   Family History Family History  Problem Relation Age of Onset  . Heart disease Mother     Heart Disease before age  84  . Other Mother     Rheumatism  . Heart attack Mother   . Heart attack Father     Social History History   Social History  . Marital Status: Married    Spouse Name: N/A    Number of Children: N/A  . Years of Education: N/A   Occupational History  . Not on file.   Social History Main Topics  . Smoking status: Former Smoker -- 0.80 packs/day for 78 years    Types: Cigarettes    Quit date: 08/05/2011  . Smokeless tobacco: Current User    Types: Chew    Last Attempt to Quit: 08/11/2011  . Alcohol Use: No  . Drug Use: No  . Sexually Active: Not on file   Other Topics Concern  . Not on file   Social History Narrative   Lives in [place] with wife.  Retired but active around the house/yard.    Review of Systems  General:  +++ chills and fevers.  Had night sweats past 2 nights.  No weight changes.  Cardiovascular:  No chest pain, +++ dyspnea on exertion and orthopnea.  No edema, palpitations, paroxysmal nocturnal dyspnea. Dermatological: No rash, lesions/masses Respiratory: +++ cough and dyspnea. Urologic: No hematuria, dysuria Abdominal:   No nausea, vomiting, diarrhea, bright red blood per rectum, melena, or hematemesis Neurologic:  No visual changes, +++ wkns in setting of dyspnea/resp illness.  No changes in mental status. All other systems reviewed and are otherwise negative except as noted above.  Physical Exam  Blood pressure 114/84, pulse 107, temperature 97.6 F (36.4 C), temperature  source Oral, resp. rate 19, height 5\' 9"  (1.753 m), weight 202 lb 2.6 oz (91.7 kg), SpO2 98.00%.  General: Pleasant, NAD Psych: Normal affect. Neuro: Alert and oriented X 3. Moves all extremities spontaneously. HEENT: Normal  Neck: Supple without bruits.  Neck is thick and difficult to assess JVD. Lungs:  Resp regular and unlabored, scatt rhonchi throughout. Heart: IR, IR, no s3, s4, 3/6 SEM - loudest @ upper sternal borders. Abdomen: Soft, protuberant, non-tender, non-distended, BS + x 4.  Extremities: No clubbing, cyanosis or edema. DP/PT/Radials 1+ and equal bilaterally.  Labs   Recent Labs  10/23/12 0305 10/23/12 0805 10/23/12 1400 10/23/12 1816  TROPONINI <0.30 <0.30 <0.30 <0.30   Lab Results  Component Value Date  WBC 11.5* 10/24/2012   HGB 11.4* 10/24/2012   HCT 35.1* 10/24/2012   MCV 82.4 10/24/2012   PLT 84* 10/24/2012     Recent Labs Lab 10/24/12 0445  NA 139  K 4.6  CL 103  CO2 23  BUN 57*  CREATININE 1.68*  CALCIUM 8.7  PROT 6.9  BILITOT 0.3  ALKPHOS 80  ALT 15  AST 16  GLUCOSE 243*   Radiology/Studies  Dg Chest Portable 1 View  10/23/2012  *RADIOLOGY REPORT*  Clinical Data: Cough, shortness of breath  PORTABLE CHEST - 1 VIEW  Comparison: 10/20/2012  Findings: Prominent cardiomediastinal contours.  Chronic increased interstitial markings.  Peribronchial thickening.  Bibasilar opacities.  Trace effusions not excluded.  No pneumothorax.  No acute osseous finding.  IMPRESSION: Prominent cardiomediastinal contours, similar to prior.  Increased interstitial markings and peribronchial thickening, at least in part chronic.  A superimposed atypical infection or interstitial edema is not excluded.  Bibasilar opacities; atelectasis versus infiltrate.   Original Report Authenticated By: Jearld Lesch, M.D.    ECG  Afib, 112, ivcd, lat st/t changes - slightly more pronounced than on prior ecg.  2D Echocardiogram 10/23/2012  Study Conclusions  - Left  ventricle: The cavity size was mildly dilated. Wall   thickness was normal. Systolic function was mildly to   moderately reduced. The estimated ejection fraction was in   the range of 40% to 45%. There is akinesis of the   posterior lateral myocardium. Doppler parameters are   consistent with abnormal left ventricular relaxation   (grade 1 diastolic dysfunction). Doppler parameters are   consistent with high ventricular filling pressure. - Aortic valve: There was mild to moderate stenosis. Trivial   regurgitation. Valve area: 1.4cm^2(VTI). Valve area:   1.49cm^2 (Vmax). - Mitral valve: Calcified annulus. Mild regurgitation. - Left atrium: The atrium was mildly dilated. Impressions: _____________  ASSESSMENT AND PLAN  1.  Afib RVR:  In setting of acute resp failure/pna. Asymptomatic. Rates have improved with loading of digoxin and as rates have improved, so as blood pressure. We will initiate oral diltiazem 30 mg every 6 hours.  His CHA2DS2VASc = 5.  As such, we will initiate heparin and warfarin (pt and wife prefer this over NOAC). Suspect that with rate lowering, and respiratory improvement, he may convert on his own however if he does not, we will plan on cardioversion once respiratory status improves.  He has a history of bradycardia and will have to watch his rates closely as he may run into trouble with tachybradycardia syndrome. Check TSH and magnesium.  2. Ischemic cardiopathy/chronic combined systolic and diastolic CHF: He appears euvolemic on exam and BNP is acceptable. Troponins are normal. LV function is stable at 40-45% on echo performed yesterday. Will work on rate control with addition of calcium channel blocker. He is a poor candidate for beta blocker because of his lung disease especially acutely. He is not currently on ACE inhibitor or ARB in the setting of chronic kidney disease.  3. Coronary artery disease: Last catheterization in 2001 showed stable anatomy with occlusions of  the obtuse marginal and right coronary artery. Patient apparently has done well over the years. Continue low-dose aspirin and statin.  4. Acute on chronic respiratory failure/pulmonary fibrosis: Steroids, antibiotics, and inhalers per internal medicine.  Signed, Nicolasa Ducking, NP 10/24/2012, 12:22 PM  This patient is well known to me.  He presents with an acute respiratory illness after dancing on Friday.  He received antibiotics and then developed an  allergic reaction.  Last night went into atrial fib, something he 1/ does not feel and 2/ is unaware of any history from the past.  He is stable currently but with a rate of about 110.  Given dig last night because of a soft BP.  He has yet to be anticoagulated.  Denies chest pain.  Has known CAD and I have cathed him in the past with well defined 2 vessel disease and AS.  Fortunately he has stopped his smoking.  His thromboembolic risk for afib is relatively high.  He also has lung disease as noted.    Favor:  1.  Add diltiazem q 6 hours for now.  2.  Stop dig 3.  Consider warfarin but will need to be discussed in light of thrombocytopenia.   4.  Would favor hematology consult as platelets have been progressively dropping over a long period and this impacts anticoagulation strategy.  4.  Consider cardioversion if not converted but will be at high risk for recurrence.    Discussed with patient and wife.

## 2012-10-24 NOTE — Progress Notes (Signed)
Pt heart rhythm changed from NSR to Afib; confirmed with EKG; EKG states AFIB with rapid ventricular response; pt asymptomatic; denies chest pain or increased shortness of breath; BP 99/58 O2 sats 94%; Joe Jackson paged;  Returned paged; see new orders

## 2012-10-25 LAB — HEMOGLOBIN A1C
Hgb A1c MFr Bld: 6.3 % — ABNORMAL HIGH (ref <5.7)
Mean Plasma Glucose: 134 mg/dL — ABNORMAL HIGH (ref <117)

## 2012-10-25 LAB — COMPREHENSIVE METABOLIC PANEL
Albumin: 2.8 g/dL — ABNORMAL LOW (ref 3.5–5.2)
BUN: 57 mg/dL — ABNORMAL HIGH (ref 6–23)
Calcium: 8.7 mg/dL (ref 8.4–10.5)
Creatinine, Ser: 1.46 mg/dL — ABNORMAL HIGH (ref 0.50–1.35)
Total Protein: 6.6 g/dL (ref 6.0–8.3)

## 2012-10-25 LAB — CBC
MCH: 27.1 pg (ref 26.0–34.0)
MCV: 82.4 fL (ref 78.0–100.0)
Platelets: 98 10*3/uL — ABNORMAL LOW (ref 150–400)
RDW: 14.7 % (ref 11.5–15.5)

## 2012-10-25 MED ORDER — COUMADIN BOOK
Freq: Once | Status: DC
  Filled 2012-10-25: qty 1

## 2012-10-25 MED ORDER — WARFARIN SODIUM 7.5 MG PO TABS
7.5000 mg | ORAL_TABLET | Freq: Once | ORAL | Status: AC
  Administered 2012-10-25: 7.5 mg via ORAL
  Filled 2012-10-25: qty 1

## 2012-10-25 MED ORDER — HEPARIN SODIUM (PORCINE) 5000 UNIT/ML IJ SOLN
5000.0000 [IU] | Freq: Three times a day (TID) | INTRAMUSCULAR | Status: DC
  Administered 2012-10-25 – 2012-10-26 (×4): 5000 [IU] via SUBCUTANEOUS
  Filled 2012-10-25 (×7): qty 1

## 2012-10-25 MED ORDER — PREDNISONE 50 MG PO TABS
60.0000 mg | ORAL_TABLET | Freq: Two times a day (BID) | ORAL | Status: DC
  Administered 2012-10-26: 60 mg via ORAL
  Filled 2012-10-25 (×3): qty 1

## 2012-10-25 MED ORDER — LEVALBUTEROL HCL 0.63 MG/3ML IN NEBU
0.6300 mg | INHALATION_SOLUTION | Freq: Four times a day (QID) | RESPIRATORY_TRACT | Status: DC
  Administered 2012-10-25 – 2012-10-26 (×2): 0.63 mg via RESPIRATORY_TRACT
  Filled 2012-10-25 (×5): qty 3

## 2012-10-25 MED ORDER — LEVALBUTEROL HCL 0.63 MG/3ML IN NEBU: 0.6300 mg | INHALATION_SOLUTION | RESPIRATORY_TRACT | Status: DC | PRN

## 2012-10-25 MED ORDER — IPRATROPIUM BROMIDE 0.02 % IN SOLN
0.5000 mg | Freq: Four times a day (QID) | RESPIRATORY_TRACT | Status: DC
  Administered 2012-10-25 – 2012-10-26 (×2): 0.5 mg via RESPIRATORY_TRACT
  Filled 2012-10-25 (×3): qty 2.5

## 2012-10-25 NOTE — Progress Notes (Signed)
Patient Name: Joe Jackson Date of Encounter: 10/25/2012     Principal Problem:   Acute and chronic respiratory failure Active Problems:   HYPERTENSION   CORONARY HEART DISEASE   COPD   Leukocytosis, unspecified   Thrombocytopenia   Dehydration   Ischemic cardiomyopathy   Pulmonary fibrosis   Chronic combined systolic and diastolic CHF (congestive heart failure)   Hyperlipidemia   PAF (paroxysmal atrial fibrillation)   Atrial fibrillation    SUBJECTIVE  Doing ok.  Converted to NSR.    CURRENT MEDS . aspirin EC  81 mg Oral Daily  . atorvastatin  20 mg Oral QHS  . diltiazem  30 mg Oral Q6H  . levalbuterol  0.63 mg Nebulization QID  . [START ON 10/26/2012] levofloxacin (LEVAQUIN) IV  750 mg Intravenous Q48H  . methylPREDNISolone (SOLU-MEDROL) injection  80 mg Intravenous Q6H  . omega-3 acid ethyl esters  1 g Oral BID  . pantoprazole  40 mg Oral Q1200  . sodium chloride  3 mL Intravenous Q12H  . Warfarin - Pharmacist Dosing Inpatient   Does not apply q1800    OBJECTIVE  Filed Vitals:   10/25/12 0532 10/25/12 0600 10/25/12 0700 10/25/12 0745  BP: 132/65 131/60 136/57   Pulse:  58 57   Temp:    97.8 F (36.6 C)  TempSrc:    Oral  Resp:  19 17 21   Height:      Weight: 205 lb 11 oz (93.3 kg)     SpO2:  93% 97% 96%    Intake/Output Summary (Last 24 hours) at 10/25/12 0852 Last data filed at 10/25/12 0500  Gross per 24 hour  Intake 384.55 ml  Output      1 ml  Net 383.55 ml   Filed Weights   10/23/12 0600 10/24/12 0500 10/25/12 0532  Weight: 204 lb 2.3 oz (92.6 kg) 202 lb 2.6 oz (91.7 kg) 205 lb 11 oz (93.3 kg)    PHYSICAL EXAM  General: Pleasant, NAD. Neuro: Alert and oriented X 3. Moves all extremities spontaneously. Psych: Normal affect. HEENT:  Normal  Neck: Supple without bruits or JVD. Lungs:  Diffuse ronchii.   Heart: RRR no s3, s4, or murmurs. Abdomen: Soft, non-tender, non-distended, BS + x 4.  Extremities: No clubbing, cyanosis or edema.  DP/PT/Radials 2+ and equal bilaterally.  Accessory Clinical Findings  CBC  Recent Labs  10/23/12 0305  10/24/12 0445 10/25/12 0454  WBC 11.6*  < > 11.5* 12.9*  NEUTROABS 9.6*  --   --   --   HGB 12.9*  < > 11.4* 11.5*  HCT 40.3  < > 35.1* 35.0*  MCV 85.0  < > 82.4 82.4  PLT 84*  < > 84* 98*  < > = values in this interval not displayed. Basic Metabolic Panel  Recent Labs  10/24/12 0445 10/24/12 1412 10/25/12 0454  NA 139  --  138  K 4.6  --  4.6  CL 103  --  102  CO2 23  --  23  GLUCOSE 243*  --  202*  BUN 57*  --  57*  CREATININE 1.68*  --  1.46*  CALCIUM 8.7  --  8.7  MG  --  2.0  --    Liver Function Tests  Recent Labs  10/24/12 0445 10/25/12 0454  AST 16 14  ALT 15 14  ALKPHOS 80 85  BILITOT 0.3 0.2*  PROT 6.9 6.6  ALBUMIN 3.0* 2.8*   No results found for  this basename: LIPASE, AMYLASE,  in the last 72 hours Cardiac Enzymes  Recent Labs  10/23/12 0805 10/23/12 1400 10/23/12 1816  TROPONINI <0.30 <0.30 <0.30   BNP No components found with this basename: POCBNP,  D-Dimer No results found for this basename: DDIMER,  in the last 72 hours Hemoglobin A1C No results found for this basename: HGBA1C,  in the last 72 hours Fasting Lipid Panel No results found for this basename: CHOL, HDL, LDLCALC, TRIG, CHOLHDL, LDLDIRECT,  in the last 72 hours Thyroid Function Tests  Recent Labs  10/24/12 1412  TSH 0.168*    TELE  NSR    Radiology/Studies  Dg Chest 2 View  10/20/2012  *RADIOLOGY REPORT*  Clinical Data: Cough and congestion for 3 days  CHEST - 2 VIEW  Comparison: 11/09/2011  Findings: The cardiac shadow is mildly enlarged but stable. Diffuse interstitial changes are noted bilaterally.  No focal infiltrate or sizable effusion is seen.  No acute bony abnormality is noted.  An old healed right clavicular fracture is noted.  IMPRESSION: Chronic changes without acute abnormality.   Original Report Authenticated By: Alcide Clever, M.D.    Dg Chest  Portable 1 View  10/23/2012  *RADIOLOGY REPORT*  Clinical Data: Cough, shortness of breath  PORTABLE CHEST - 1 VIEW  Comparison: 10/20/2012  Findings: Prominent cardiomediastinal contours.  Chronic increased interstitial markings.  Peribronchial thickening.  Bibasilar opacities.  Trace effusions not excluded.  No pneumothorax.  No acute osseous finding.  IMPRESSION: Prominent cardiomediastinal contours, similar to prior.  Increased interstitial markings and peribronchial thickening, at least in part chronic.  A superimposed atypical infection or interstitial edema is not excluded.  Bibasilar opacities; atelectasis versus infiltrate.   Original Report Authenticated By: Jearld Lesch, M.D.     ASSESSMENT AND PLAN  1.  Atrial fibrillation  -- converted 2.  CAD  Long standing 3.  DM 4.  CKD  Since he converted less than 48 hours he could be converted back to DVT prop and warfarin started by pharmacy.  Will defer to gen med on this.  May need to cut back on dilt as well.    Will follow with you.   Signed, Shawnie Pons MD, [facility], FSCAI

## 2012-10-25 NOTE — Progress Notes (Signed)
Utilization Review Completed. 10/25/2012  

## 2012-10-25 NOTE — Progress Notes (Signed)
ANTICOAGULATION CONSULT NOTE - Follow Up Consult  Pharmacy Consult for Heparin --> Coumadin Indication: atrial fibrillation  No Known Allergies  Patient Measurements: Height: 5\' 9"  (175.3 cm) Weight: 205 lb 11 oz (93.3 kg) IBW/kg (Calculated) : 70.7 Heparin Dosing Weight: 87 kg  Vital Signs: Temp: 97.8 F (36.6 C) (03/24 0745) Temp src: Oral (03/24 0745) BP: 134/59 mmHg (03/24 0800) Pulse Rate: 58 (03/24 0800)  Labs:  Recent Labs  10/23/12 0805 10/23/12 1400 10/23/12 1816 10/24/12 0445 10/24/12 2143 10/25/12 0454  HGB 12.0*  --   --  11.4*  --  11.5*  HCT 36.2*  --   --  35.1*  --  35.0*  PLT 77*  --   --  84*  --  98*  LABPROT  --   --   --   --   --  13.9  INR  --   --   --   --   --  1.08  HEPARINUNFRC  --   --   --   --  0.39 0.34  CREATININE 1.76*  --   --  1.68*  --  1.46*  TROPONINI <0.30 <0.30 <0.30  --   --   --     Estimated Creatinine Clearance: 44.7 ml/min (by C-G formula based on Cr of 1.46).   Medications:  Scheduled:  . aspirin EC  81 mg Oral Daily  . atorvastatin  20 mg Oral QHS  . diltiazem  30 mg Oral Q6H  . levalbuterol  0.63 mg Nebulization QID  . [START ON 10/26/2012] levofloxacin (LEVAQUIN) IV  750 mg Intravenous Q48H  . methylPREDNISolone (SOLU-MEDROL) injection  80 mg Intravenous Q6H  . omega-3 acid ethyl esters  1 g Oral BID  . pantoprazole  40 mg Oral Q1200  . sodium chloride  3 mL Intravenous Q12H  . [COMPLETED] warfarin  7.5 mg Oral ONCE-1800  . Warfarin - Pharmacist Dosing Inpatient   Does not apply q1800  . [DISCONTINUED] budesonide-formoterol  2 puff Inhalation Q12H  . [DISCONTINUED] digoxin  0.125 mg Oral Daily  . [DISCONTINUED] famotidine (PEPCID) IV  20 mg Intravenous Q12H  . [DISCONTINUED] famotidine  40 mg Oral BID  . [DISCONTINUED] heparin  5,000 Units Subcutaneous Q8H  . [DISCONTINUED] levalbuterol  0.63 mg Nebulization Q4H  . [DISCONTINUED] levalbuterol  0.63 mg Nebulization Q6H  . [DISCONTINUED] levofloxacin  (LEVAQUIN) IV  500 mg Intravenous Q24H  . [DISCONTINUED] tiotropium  18 mcg Inhalation Daily   Infusions:  . sodium chloride 10 mL/hr at 10/25/12 0700  . heparin 1,300 Units/hr (10/25/12 8119)    Assessment: 79 yo M continues on heparin bridge to Coumadin for afib.  Pt now converted to NSR and continues on diltiazem.  Heparin level is at lower end of goal range at 1300 units/hr.  INR remains subtherapeutic as expected after only one dose of Coumadin administered.  Goal of Therapy:  INR 2-3 Heparin level 0.3-0.7 units/ml Monitor platelets by anticoagulation protocol: Yes   Plan:  Increase heparin to 1400 units/hr to target mid-range of goal. Repeat Coumadin 7.5 mg PO x 1 tonight. Follow-up with INR, CBC, and heparin level with AM labs.  [employer], Pharm.D., BCPS Clinical Pharmacist Pager [phone] 10/25/2012 9:12 AM

## 2012-10-25 NOTE — Evaluation (Signed)
Occupational Therapy Evaluation Patient Details Name: Joe Jackson MRN: [id number] DOB: November 27, 1929 Today's Date: 10/25/2012 Time: 1191-4782 OT Time Calculation (min): 23 min  OT Assessment / Plan / Recommendation Clinical Impression  Pt admitted with SOB and afib with RVR in the setting of COPD.  Pt is performing at a supervision to modified independent level in ADL and mobility.  Has DME if needed.  Educated in energy conservation and safety related to falls.      OT Assessment  Patient does not need any further OT services    Follow Up Recommendations  Supervision/Assistance - 24 hour (initially)    Barriers to Discharge      Equipment Recommendations  None recommended by OT    Recommendations for Other Services    Frequency       Precautions / Restrictions Precautions Precautions: Fall Restrictions Weight Bearing Restrictions: No   Pertinent Vitals/Pain No pain.    ADL  Eating/Feeding: Independent Where Assessed - Eating/Feeding: Chair Grooming: Wash/dry hands;Supervision/safety Where Assessed - Grooming: Unsupported standing Upper Body Bathing: Supervision/safety Where Assessed - Upper Body Bathing: Unsupported sitting Lower Body Bathing: Supervision/safety Where Assessed - Lower Body Bathing: Supported sit to stand Upper Body Dressing: Supervision/safety Where Assessed - Upper Body Dressing: Unsupported sitting Lower Body Dressing: Supervision/safety Where Assessed - Lower Body Dressing: Supported sit to Pharmacist, hospital: Supervision/safety Statistician Method: Sit to Barista: Regular height toilet Toileting - Architect and Hygiene: Supervision/safety Where Assessed - Engineer, mining and Hygiene: Standing Equipment Used: Gait belt Transfers/Ambulation Related to ADLs: supervision due to 02 line ADL Comments: Pt able to cross his foot over his opposite knee to donn socks, wash feet.  Instructed in  safety and energy conservation    OT Diagnosis:    OT Problem List:   OT Treatment Interventions:     OT Goals    Visit Information  Last OT Received On: 10/25/12 Assistance Needed: +1    Subjective Data  Subjective: "I climb up on the roof and in trees." Patient Stated Goal: Return to PLOF.   Prior Functioning     Home Living Lives With: Spouse Available Help at Discharge: Family;Friend(s);Available 24 hours/day Type of Home: House Home Access: Stairs to enter [employer] of Steps: 1 Entrance Stairs-Rails: None Home Layout: One level Bathroom Shower/Tub: Engineer, manufacturing systems: Standard Bathroom Accessibility: Yes How Accessible: Accessible via walker Home Adaptive Equipment: Tub transfer bench;Walker - four wheeled;Grab bars in shower Additional Comments: uses rollator to carry tools from house to barn Prior Function Level of Independence: Independent Able to Take Stairs?: Yes Driving: Yes Vocation: Retired Musician: No difficulties Dominant Hand: Right         Vision/Perception Vision - History Baseline Vision: Wears glasses all the time Patient Visual Report: No change from baseline   Cognition  Cognition Overall Cognitive Status: Appears within functional limits for tasks assessed/performed Arousal/Alertness: Awake/alert Orientation Level: Appears intact for tasks assessed Behavior During Session: [facility] for tasks performed    Extremity/Trunk Assessment Right Upper Extremity Assessment RUE ROM/Strength/Tone: WFL for tasks assessed RUE Sensation: WFL - Light Touch RUE Coordination: WFL - gross/fine motor Left Upper Extremity Assessment LUE ROM/Strength/Tone: WFL for tasks assessed LUE Sensation: WFL - Light Touch LUE Coordination: WFL - gross/fine motor Trunk Assessment Trunk Assessment: Normal     Mobility Bed Mobility Bed Mobility: Not assessed Transfers Transfers: Sit to Stand;Stand to Sit Sit to  Stand: 6: Modified independent (Device/Increase time);With upper extremity assist;From chair/3-in-1;From  toilet Stand to Sit: 6: Modified independent (Device/Increase time);With upper extremity assist;To chair/3-in-1;With armrests;To toilet Details for Transfer Assistance: [employer] good use of UEs.       Exercise     Balance Balance Balance Assessed: No   End of Session OT - End of Session Activity Tolerance: Patient tolerated treatment well Patient left: in chair;with call bell/phone within reach;with family/visitor present;with nursing in room  GO     Evern Bio 10/25/2012, 2:49 PM

## 2012-10-25 NOTE — Progress Notes (Signed)
Report called to Jess, RN on 4700. Pt transferred by wheelchair to new room, wife present and aware of transfer. On 2L [place], all belongings sent with pt.    Delynn Flavin, RN, BSN

## 2012-10-25 NOTE — Progress Notes (Signed)
Physical Therapy Treatment Patient Details Name: Joe Jackson MRN: [id number] DOB: 1929-12-07 Today's Date: 10/25/2012 Time: 6962-9528 PT Time Calculation (min): 16 min  PT Assessment / Plan / Recommendation Comments on Treatment Session  pt presents with Respiratory Failure and A-fib.  pt moving well and maintaining sats on 2L O2.      Follow Up Recommendations  No PT follow up;Supervision - Intermittent     Does the patient have the potential to tolerate intense rehabilitation     Barriers to Discharge        Equipment Recommendations  None recommended by PT    Recommendations for Other Services    Frequency Min 3X/week   Plan Discharge plan remains appropriate;Frequency remains appropriate    Precautions / Restrictions Precautions Precautions: None Restrictions Weight Bearing Restrictions: No   Pertinent Vitals/Pain Denies pain.      Mobility  Bed Mobility Bed Mobility: Not assessed Transfers Transfers: Sit to Stand;Stand to Sit Sit to Stand: 6: Modified independent (Device/Increase time);With upper extremity assist;From chair/3-in-1;With armrests Stand to Sit: 6: Modified independent (Device/Increase time);With upper extremity assist;To chair/3-in-1;With armrests Details for Transfer Assistance: Demos good use of UEs.   Ambulation/Gait Ambulation/Gait Assistance: 5: Supervision Ambulation Distance (Feet): 300 Feet Assistive device: None Ambulation/Gait Assistance Details: pt moves quickly, though states this is slow for him.  cues to slow down, deep breathing.  pt on 2 L O2 throughout mobility with sats remaining in high 90's throughout.   Gait Pattern: Within Functional Limits Stairs: No Wheelchair Mobility Wheelchair Mobility: No    Exercises     PT Diagnosis:    PT Problem List:   PT Treatment Interventions:     PT Goals Acute Rehab PT Goals Time For Goal Achievement: 10/31/12 Potential to Achieve Goals: Good PT Goal: Sit to Stand - Progress:  Progressing toward goal PT Goal: Ambulate - Progress: Progressing toward goal  Visit Information  Last PT Received On: 10/25/12 Assistance Needed: +1    Subjective Data  Subjective: I'll race if you want.     Cognition  Cognition Overall Cognitive Status: Appears within functional limits for tasks assessed/performed Arousal/Alertness: Awake/alert Orientation Level: Appears intact for tasks assessed Behavior During Session: [facility] for tasks performed    Balance  Balance Balance Assessed: No  End of Session PT - End of Session Equipment Utilized During Treatment: Gait belt;Oxygen Activity Tolerance: Patient tolerated treatment well Patient left: in chair;with call bell/phone within reach Nurse Communication: Mobility status   GP     Sunny Schlein, [place] [phone] 10/25/2012, 12:10 PM

## 2012-10-25 NOTE — Progress Notes (Addendum)
[facility] Progress Note [place] TEAM 1 - Stepdown/ICU TEAM   Joe Jackson XBJ:[id number] DOB: 1929/12/29 DOA: 10/23/2012 PCP: Loreen Freud, DO  Brief narrative: 79 year old former smoker with pulmonary fibrosis currently not on home oxygen, mild obstructive sleep apnea, PFTs 1/09 FVC 60%, ratio 81 - no airway obstruction.  Started having cough and dyspnea about one week prior to admission.  His primary care doctor started him on a Z-Pak and steroids.  He remained stable until one day prior to admission when he started getting significantly short of breath at rest with a persistent cough. He reported chills at home but did not check his temperature. He related he had no PND, orthopnea, dizziness. He went to [facility] - a chest x-ray was done that revealed pulmonary fibrosis and interstitial markings - a BNP was 700, his saturations were above 90% on 2 L. Temperature was 100.2, and white count was 11 with a left shift and toxic granulations.   Assessment/Plan:  Acute on chronic respiratory failure Felt to be due to COPD exacerbation in the setting of pulmonary fibrosis - continue medical therapy and follow closely clinically - slowly improving - initiate steroid taper  Acute onset/newly diagnosed atrial fibrillation As per Cardiology - converted to normal sinus rhythm spontaneously - cardiology has initiated Coumadin therapy  Drug rash - allergic reaction to Rocephin? rash has resolved   HYPERTENSION well-controlled at the present time  Congestive heart failure - ischemic cardiomyopathy appears well compensated at present - EF 40-45% by echo 10/2012   History of known coronary artery disease As per cardiology  COPD - 63 pack-year smoking history as noted above   Probable sleep apnea Needs formal sleep study as outpt - has refused trial of CPAP while in hospital  Thrombocytopenia platelet count noted to be 140 in 2010 - 133 in 2013 - and now 84 at time of  presentation - anticoagulation was being held - cardiology has suggested a hematology consultation - platelets are currently trending upward - there is no evidence of spontaneous bleeding - his platelet count drops again will consider inpatient hematology consultation  Hyperlipidemia Continue home treatment plan  Aortic abdominal aneurysm  Mild to moderate aortic stenosis Cardiology following  Acute vs/ chronic kidney disease Follow crt trend - improving since admit   Code Status: FULL Family Communication: spoke w/ pt and wife  Disposition Plan: Transfer to telemetry  Consultants: Cardiology   Procedures: None   Antibiotics: Levaquin 3/22 >>  DVT prophylaxis: lovenox  HPI/Subjective: He states that his shortness of breath has improved.  He denies chest pain fevers chills nausea or vomiting.  He is alert and conversant.  Objective: Blood pressure 137/58, pulse 59, temperature 98.2 F (36.8 C), temperature source Oral, resp. rate 18, height 5\' 9"  (1.753 m), weight 93.3 kg (205 lb 11 oz), SpO2 99.00%.  Intake/Output Summary (Last 24 hours) at 10/25/12 1138 Last data filed at 10/25/12 0900  Gross per 24 hour  Intake 798.55 ml  Output      1 ml  Net 797.55 ml   Exam: General: No acute respiratory distress at rest  Lungs: scattered diffuse wheezes but improved air movement throughout all fields without focal crackles Cardiovascular: Regular rate and rhythm at present - 2/6 holosystolic murmur  Abdomen:  morbidly obese, nontender, nondistended, soft, bowel sounds positive, no rebound, no ascites, no appreciable mass Extremities: No significant cyanosis, clubbing, 1+ edema bilateral lower extremities  Data Reviewed: Basic Metabolic Panel:  Recent Labs  Lab 10/23/12 0305 10/23/12 0805 10/24/12 0445 10/24/12 1412 10/25/12 0454  NA 139  --  139  --  138  K 4.6  --  4.6  --  4.6  CL 101  --  103  --  102  CO2 26  --  23  --  23  GLUCOSE 99  --  243*  --  202*  BUN  52*  --  57*  --  57*  CREATININE 1.60* 1.76* 1.68*  --  1.46*  CALCIUM 9.8  --  8.7  --  8.7  MG  --   --   --  2.0  --    Liver Function Tests:  Recent Labs Lab 10/24/12 0445 10/25/12 0454  AST 16 14  ALT 15 14  ALKPHOS 80 85  BILITOT 0.3 0.2*  PROT 6.9 6.6  ALBUMIN 3.0* 2.8*   CBC:  Recent Labs Lab 10/23/12 0305 10/23/12 0805 10/24/12 0445 10/25/12 0454  WBC 11.6* 14.2* 11.5* 12.9*  NEUTROABS 9.6*  --   --   --   HGB 12.9* 12.0* 11.4* 11.5*  HCT 40.3 36.2* 35.1* 35.0*  MCV 85.0 81.7 82.4 82.4  PLT 84* 77* 84* 98*   Cardiac Enzymes:  Recent Labs Lab 10/23/12 0305 10/23/12 0805 10/23/12 1400 10/23/12 1816  TROPONINI <0.30 <0.30 <0.30 <0.30   BNP (last 3 results)  Recent Labs  11/09/11 1236 10/23/12 0315  PROBNP 409.3 728.0*     Recent Results (from the past 240 hour(s))  CULTURE, BLOOD (ROUTINE X 2)     Status: None   Collection Time    10/23/12  3:35 AM      Result Value Range Status   Specimen Description BLOOD RIGHT HAND   Final   Special Requests     Final   Value: BOTTLES DRAWN AEROBIC AND ANAEROBIC AER 7cc ANA 5cc   Culture  Setup Time 10/23/2012 06:50   Final   Culture     Final   Value:        BLOOD CULTURE RECEIVED NO GROWTH TO DATE CULTURE WILL BE HELD FOR 5 DAYS BEFORE ISSUING A FINAL NEGATIVE REPORT   Report Status PENDING   Incomplete  CULTURE, BLOOD (ROUTINE X 2)     Status: None   Collection Time    10/23/12  3:35 AM      Result Value Range Status   Specimen Description BLOOD LEFT ANTECUBITAL   Final   Special Requests     Final   Value: BOTTLES DRAWN AEROBIC AND ANAEROBIC AER 10cc ANA 5cc   Culture  Setup Time 10/23/2012 06:50   Final   Culture     Final   Value:        BLOOD CULTURE RECEIVED NO GROWTH TO DATE CULTURE WILL BE HELD FOR 5 DAYS BEFORE ISSUING A FINAL NEGATIVE REPORT   Report Status PENDING   Incomplete  MRSA PCR SCREENING     Status: None   Collection Time    10/23/12  5:49 AM      Result Value Range Status    MRSA by PCR NEGATIVE  NEGATIVE Final   Comment:            The GeneXpert MRSA Assay (FDA     approved for NASAL specimens     only), is one component of a     comprehensive MRSA colonization     surveillance program. It is not     intended to diagnose MRSA  infection nor to guide or     monitor treatment for     MRSA infections.  CULTURE, BLOOD (ROUTINE X 2)     Status: None   Collection Time    10/23/12  7:45 AM      Result Value Range Status   Specimen Description BLOOD RIGHT ARM   Final   Special Requests BOTTLES DRAWN AEROBIC AND ANAEROBIC 10CC   Final   Culture  Setup Time 10/23/2012 14:50   Final   Culture     Final   Value:        BLOOD CULTURE RECEIVED NO GROWTH TO DATE CULTURE WILL BE HELD FOR 5 DAYS BEFORE ISSUING A FINAL NEGATIVE REPORT   Report Status PENDING   Incomplete  CULTURE, BLOOD (ROUTINE X 2)     Status: None   Collection Time    10/23/12  8:05 AM      Result Value Range Status   Specimen Description BLOOD RIGHT HAND   Final   Special Requests     Final   Value: BOTTLES DRAWN AEROBIC AND ANAEROBIC 10CC BLUE 7CC RED   Culture  Setup Time 10/23/2012 14:50   Final   Culture     Final   Value:        BLOOD CULTURE RECEIVED NO GROWTH TO DATE CULTURE WILL BE HELD FOR 5 DAYS BEFORE ISSUING A FINAL NEGATIVE REPORT   Report Status PENDING   Incomplete  CULTURE, EXPECTORATED SPUTUM-ASSESSMENT     Status: None   Collection Time    10/23/12  6:46 PM      Result Value Range Status   Specimen Description SPUTUM   Final   Special Requests NONE   Final   Sputum evaluation     Final   Value: THIS SPECIMEN IS ACCEPTABLE. RESPIRATORY CULTURE REPORT TO FOLLOW.   Report Status 10/23/2012 FINAL   Final  CULTURE, RESPIRATORY (NON-EXPECTORATED)     Status: None   Collection Time    10/23/12  6:46 PM      Result Value Range Status   Specimen Description SPUTUM   Final   Special Requests NONE   Final   Gram Stain     Final   Value: FEW WBC PRESENT, PREDOMINANTLY PMN      RARE SQUAMOUS EPITHELIAL CELLS PRESENT     FEW GRAM POSITIVE COCCI     IN PAIRS   Culture NORMAL OROPHARYNGEAL FLORA   Final   Report Status PENDING   Incomplete     Studies:  Recent x-ray studies have been reviewed in detail by the Attending Physician  Scheduled Meds:  Scheduled Meds: . aspirin EC  81 mg Oral Daily  . atorvastatin  20 mg Oral QHS  . diltiazem  30 mg Oral Q6H  . levalbuterol  0.63 mg Nebulization QID  . [START ON 10/26/2012] levofloxacin (LEVAQUIN) IV  750 mg Intravenous Q48H  . methylPREDNISolone (SOLU-MEDROL) injection  80 mg Intravenous Q6H  . omega-3 acid ethyl esters  1 g Oral BID  . pantoprazole  40 mg Oral Q1200  . sodium chloride  3 mL Intravenous Q12H  . warfarin  7.5 mg Oral ONCE-1800  . Warfarin - Pharmacist Dosing Inpatient   Does not apply q1800   Continuous Infusions: . sodium chloride 10 mL/hr at 10/25/12 0700  . heparin 1,400 Units/hr (10/25/12 1610)    Time spent on care of this patient:   [facility]  [facility] Office  [phone] Pager -  Text Page per Loretha Stapler as per below:  On-Call/Text Page:      [email or web address]      password [email or web address]  If 7PM-7AM, please contact night-coverage [email or web address] Password [facility] 10/25/2012, 11:38 AM   LOS: 2 days

## 2012-10-25 NOTE — Progress Notes (Signed)
Inpatient Diabetes Program Recommendations  AACE/ADA: New Consensus Statement on Inpatient Glycemic Control (2013)  Target Ranges:  Prepandial:   less than 140 mg/dL      Peak postprandial:   less than 180 mg/dL (1-2 hours)      Critically ill patients:  140 - 180 mg/dL    Results for AMEDEE, CERRONE (MRN [id number]) as of 10/25/2012 14:10  Ref. Range 10/24/2012 04:45  Glucose Latest Range: 70-99 mg/dL 425 (H)   Results for DAVIONTE, LUSBY (MRN [id number]) as of 10/25/2012 14:10  Ref. Range 10/25/2012 04:54  Glucose Latest Range: 70-99 mg/dL 332 (H)   MD- Please consider checking CBGs on this patient while he is on steroids.  If CBGs elevated, please consider starting Novolog Moderate correction scale (SSI) tid ac + HS.  Will follow. Ambrose Finland RN, MSN, CDE Diabetes Coordinator Inpatient Diabetes Program [phone]

## 2012-10-26 DIAGNOSIS — J8410 Pulmonary fibrosis, unspecified: Secondary | ICD-10-CM

## 2012-10-26 LAB — BASIC METABOLIC PANEL
CO2: 25 mEq/L (ref 19–32)
Chloride: 107 mEq/L (ref 96–112)
Glucose, Bld: 207 mg/dL — ABNORMAL HIGH (ref 70–99)
Potassium: 4.6 mEq/L (ref 3.5–5.1)
Sodium: 141 mEq/L (ref 135–145)

## 2012-10-26 LAB — CULTURE, RESPIRATORY W GRAM STAIN: Culture: NORMAL

## 2012-10-26 LAB — PROTIME-INR: Prothrombin Time: 15.1 seconds (ref 11.6–15.2)

## 2012-10-26 LAB — CBC
Hemoglobin: 12 g/dL — ABNORMAL LOW (ref 13.0–17.0)
Platelets: 116 10*3/uL — ABNORMAL LOW (ref 150–400)
RBC: 4.5 MIL/uL (ref 4.22–5.81)
WBC: 11.4 10*3/uL — ABNORMAL HIGH (ref 4.0–10.5)

## 2012-10-26 MED ORDER — LEVALBUTEROL HCL 0.63 MG/3ML IN NEBU: 0.6300 mg | INHALATION_SOLUTION | RESPIRATORY_TRACT | Status: DC | PRN

## 2012-10-26 MED ORDER — WARFARIN SODIUM 5 MG PO TABS
5.0000 mg | ORAL_TABLET | Freq: Once | ORAL | Status: DC
  Filled 2012-10-26: qty 1

## 2012-10-26 MED ORDER — DILTIAZEM HCL ER COATED BEADS 120 MG PO CP24: 120.0000 mg | ORAL_CAPSULE | Freq: Every day | ORAL | Status: DC

## 2012-10-26 MED ORDER — LEVOFLOXACIN 750 MG PO TABS: 750.0000 mg | ORAL_TABLET | Freq: Every day | ORAL | Status: DC

## 2012-10-26 MED ORDER — WARFARIN SODIUM 6 MG PO TABS
6.0000 mg | ORAL_TABLET | Freq: Once | ORAL | Status: DC
  Filled 2012-10-26: qty 1

## 2012-10-26 MED ORDER — WARFARIN SODIUM 5 MG PO TABS: 5.0000 mg | ORAL_TABLET | Freq: Every day | ORAL | Status: DC

## 2012-10-26 MED ORDER — PREDNISONE 10 MG PO TABS: 10.0000 mg | ORAL_TABLET | Freq: Every day | ORAL | Status: DC

## 2012-10-26 NOTE — Discharge Summary (Signed)
Physician Discharge Summary  Patient ID: Joe Jackson MRN: [id number] DOB/AGE: 77/25/1931 77 y.o.  Admit date: 10/23/2012 Discharge date: 10/26/2012  Primary Care Physician:  Loreen Freud, DO   Discharge Diagnoses:    Principal Problem:   Acute and chronic respiratory failure Active Problems:   HYPERTENSION   CORONARY HEART DISEASE   COPD   Leukocytosis, unspecified   Thrombocytopenia   Dehydration   Ischemic cardiomyopathy   Pulmonary fibrosis   Chronic combined systolic and diastolic CHF (congestive heart failure)   Hyperlipidemia   PAF (paroxysmal atrial fibrillation)   Atrial fibrillation      Medication List    STOP taking these medications       azithromycin 250 MG tablet  Commonly known as:  ZITHROMAX Z-PAK     DUONEB 0.5-2.5 (3) MG/3ML Soln  Generic drug:  ipratropium-albuterol     lisinopril-hydrochlorothiazide 20-12.5 MG per tablet  Commonly known as:  PRINZIDE,ZESTORETIC     NONFORMULARY OR COMPOUNDED ITEM      TAKE these medications       aspirin EC 81 MG tablet  Take 81 mg by mouth daily.     atorvastatin 40 MG tablet  Commonly known as:  LIPITOR  Take 0.5 tablets (20 mg total) by mouth at bedtime.     budesonide-formoterol 160-4.5 MCG/ACT inhaler  Commonly known as:  SYMBICORT  Inhale 2 puffs into the lungs 2 (two) times daily.     cetirizine 10 MG tablet  Commonly known as:  ZYRTEC  Take 10 mg by mouth daily.     CO Q 10 PO  Take 1 tablet by mouth daily.     diltiazem 120 MG 24 hr capsule  Commonly known as:  CARDIZEM CD  Take 1 capsule (120 mg total) by mouth daily.     FISH OIL PO  Take 1 capsule by mouth daily.     guaiFENesin 600 MG 12 hr tablet  Commonly known as:  MUCINEX  Take 1,200 mg by mouth daily.     levalbuterol 0.63 MG/3ML nebulizer solution  Commonly known as:  XOPENEX  Take 3 mLs (0.63 mg total) by nebulization every 2 (two) hours as needed for wheezing or shortness of breath.     levofloxacin 750 MG  tablet  Commonly known as:  LEVAQUIN  Take 1 tablet (750 mg total) by mouth daily.     multivitamin with minerals Tabs  Take 1 tablet by mouth daily.     ODOR FREE GARLIC 100 MG Tabs  Generic drug:  Garlic  Take 1 tablet by mouth daily.     predniSONE 10 MG tablet  Commonly known as:  DELTASONE  Take 1 tablet (10 mg total) by mouth daily. 6 tablets twice a day for 3 days, then 5 tablets BID for 3 days, then 4 tablets BID for 3 days, then 3 tablets BID for 3 days, then 2 tablets BID for 3 days, then 1 tablet BID for 3 days, then discontinue.     VITAMIN B-12 PO  Take 1 tablet by mouth daily.     warfarin 5 MG tablet  Commonly known as:  COUMADIN  Take 1 tablet (5 mg total) by mouth daily.         Disposition and Follow-up:  Will be discharged home today in stable and improved condition. CM will arrange for appointment at the [facility] coumadin clinic in 2 days to follow INR on newly started coumadin.  Consults:  Cardiology, Dr. Riley Kill  Significant Diagnostic Studies:  No results found.  Brief H and P: For complete details please refer to admission H and P, but in brief patient is an 79 year old former smoker with pulmonary fibrosis, he is currently not on home oxygen, mild obstructive sleep apnea, pulmonary Fibrosis with PFTs- 1/09 FVC 60%, ratio 81 - no airway obstructoin!, But started having cough and dyspnea about one week prior to admission when she has primary care doctor who started him on a Z-Pak and steroids remained stable until one day prior to admission he started getting significantly short of breath at rest with a persistent cough. He reports chills at home but he did not check his temperature. He relates he has no PND, orthopnea, dizziness. He relates his cough has progressively gotten worse overnight and so has his shortness of breath. He relates before he could walk more than 100 feet without getting short of breath now he can not even walk to the bathroom without  getting short of breath the [facility] a chest x-ray was done that shows pulmonary fibrosis and interstitial marking, a BMP was done that is 700, his saturations been above 90% on 2 L. We were asked to admit him for further evaluation and management.     Hospital Course:  Principal Problem:   Acute and chronic respiratory failure Active Problems:   HYPERTENSION   CORONARY HEART DISEASE   COPD   Leukocytosis, unspecified   Thrombocytopenia   Dehydration   Ischemic cardiomyopathy   Pulmonary fibrosis   Chronic combined systolic and diastolic CHF (congestive heart failure)   Hyperlipidemia   PAF (paroxysmal atrial fibrillation)   Atrial fibrillation    Acute on chronic respiratory failure  Felt to be due to COPD exacerbation in the setting of pulmonary fibrosis Is not requiring oxygen at present. Will continue slow prednisone taper on discharge.  Acute onset/newly diagnosed atrial fibrillation  Rate controlled. Has been newly started on coumadin. He will follow at the [facility] Coumadin clinic.  HYPERTENSION  well-controlled at the present time  Congestive heart failure - ischemic cardiomyopathy  appears well compensated at present - EF 40-45% by echo 10/2012   History of known coronary artery disease  As per cardiology   COPD - 63 pack-year smoking history  as noted above   Thrombocytopenia  platelet count noted to be 140 in 2010 - 133 in 2013 - Is 116 today. No need for heme f/u at present.   Time spent on Discharge: Greater than 30 minutes.  SignedChaya Jan [facility] Pager: [phone] 10/26/2012, 3:03 PM

## 2012-10-26 NOTE — Progress Notes (Signed)
10/26/12 1445 In to speak with pt. and spouse about PT/INR labwork for Coumadin.  Pt. currently has Dr. Riley Kill as his cardilogist.  Pt. would like to be seen at the Coumandin clinic. This NCM set new coumadin PT/INR draw on Thursday, March 27 at 9:30am.  Gave this information to the pt.'s spouse as well as documenting this on the AVS discharge paperwork.  Pt. to dc home today. Tera Mater, RN, BSN NCM [phone]

## 2012-10-26 NOTE — Progress Notes (Signed)
Patient Name: Joe Jackson Date of Encounter: 10/26/2012     Principal Problem:   Acute and chronic respiratory failure Active Problems:   HYPERTENSION   CORONARY HEART DISEASE   COPD   Leukocytosis, unspecified   Thrombocytopenia   Dehydration   Ischemic cardiomyopathy   Pulmonary fibrosis   Chronic combined systolic and diastolic CHF (congestive heart failure)   Hyperlipidemia   PAF (paroxysmal atrial fibrillation)   Atrial fibrillation    SUBJECTIVE  Patient stable and feeling better.  His wife says he does have problems with urinary stream with delayed emptying.  No chest pain.    CURRENT MEDS . aspirin EC  81 mg Oral Daily  . atorvastatin  20 mg Oral QHS  . coumadin book   Does not apply Once  . diltiazem  30 mg Oral Q6H  . heparin subcutaneous  5,000 Units Subcutaneous Q8H  . ipratropium  0.5 mg Nebulization Q6H  . levalbuterol  0.63 mg Nebulization Q6H  . levofloxacin (LEVAQUIN) IV  750 mg Intravenous Q48H  . omega-3 acid ethyl esters  1 g Oral BID  . pantoprazole  40 mg Oral Q1200  . predniSONE  60 mg Oral BID WC  . sodium chloride  3 mL Intravenous Q12H  . warfarin  5 mg Oral ONCE-1800  . Warfarin - Pharmacist Dosing Inpatient   Does not apply q1800    OBJECTIVE  Filed Vitals:   10/26/12 0122 10/26/12 0158 10/26/12 0555 10/26/12 1111  BP:   158/76 133/64  Pulse: 65  61   Temp: 97.8 F (36.6 C)  97.7 F (36.5 C)   TempSrc: Oral  Oral   Resp:   18   Height:      Weight:   206 lb 1.6 oz (93.486 kg)   SpO2: 98% 96% 98%     Intake/Output Summary (Last 24 hours) at 10/26/12 1121 Last data filed at 10/26/12 0809  Gross per 24 hour  Intake 1173.17 ml  Output   1025 ml  Net 148.17 ml   Filed Weights   10/25/12 0532 10/25/12 1429 10/26/12 0555  Weight: 205 lb 11 oz (93.3 kg) 207 lb 14.3 oz (94.3 kg) 206 lb 1.6 oz (93.486 kg)    PHYSICAL EXAM  General: Pleasant, NAD. Neuro: Alert and oriented X 3. Moves all extremities spontaneously. Psych:  Normal affect. HEENT:  Normal  Neck: Supple without bruits or JVD. Lungs:  Minimal ronchii Heart: RRR 3/6 SEM.  No DM Extremities: No clubbing, cyanosis or edema. DP/PT/Radials 2+ and equal bilaterally.  Accessory Clinical Findings  CBC  Recent Labs  10/25/12 0454 10/26/12 0540  WBC 12.9* 11.4*  HGB 11.5* 12.0*  HCT 35.0* 37.4*  MCV 82.4 83.1  PLT 98* 116*   Basic Metabolic Panel  Recent Labs  10/24/12 0445 10/24/12 1412 10/25/12 0454 10/26/12 0540  NA 139  --  138 141  K 4.6  --  4.6 4.6  CL 103  --  102 107  CO2 23  --  23 25  GLUCOSE 243*  --  202* 207*  BUN 57*  --  57* 57*  CREATININE 1.68*  --  1.46* 1.40*  CALCIUM 8.7  --  8.7 8.9  MG  --  2.0  --   --    Liver Function Tests  Recent Labs  10/24/12 0445 10/25/12 0454  AST 16 14  ALT 15 14  ALKPHOS 80 85  BILITOT 0.3 0.2*  PROT 6.9 6.6  ALBUMIN 3.0*  2.8*   No results found for this basename: LIPASE, AMYLASE,  in the last 72 hours Cardiac Enzymes  Recent Labs  10/23/12 1400 10/23/12 1816  TROPONINI <0.30 <0.30   BNP No components found with this basename: POCBNP,  D-Dimer No results found for this basename: DDIMER,  in the last 72 hours Hemoglobin A1C  Recent Labs  10/25/12 0454  HGBA1C 6.3*   Fasting Lipid Panel No results found for this basename: CHOL, HDL, LDLCALC, TRIG, CHOLHDL, LDLDIRECT,  in the last 72 hours Thyroid Function Tests  Recent Labs  10/24/12 1412  TSH 0.168*    TELE  NSR    Radiology/Studies  Dg Chest 2 View  10/20/2012  *RADIOLOGY REPORT*  Clinical Data: Cough and congestion for 3 days  CHEST - 2 VIEW  Comparison: 11/09/2011  Findings: The cardiac shadow is mildly enlarged but stable. Diffuse interstitial changes are noted bilaterally.  No focal infiltrate or sizable effusion is seen.  No acute bony abnormality is noted.  An old healed right clavicular fracture is noted.  IMPRESSION: Chronic changes without acute abnormality.   Original Report  Authenticated By: Alcide Clever, M.D.    Dg Chest Portable 1 View  10/23/2012  *RADIOLOGY REPORT*  Clinical Data: Cough, shortness of breath  PORTABLE CHEST - 1 VIEW  Comparison: 10/20/2012  Findings: Prominent cardiomediastinal contours.  Chronic increased interstitial markings.  Peribronchial thickening.  Bibasilar opacities.  Trace effusions not excluded.  No pneumothorax.  No acute osseous finding.  IMPRESSION: Prominent cardiomediastinal contours, similar to prior.  Increased interstitial markings and peribronchial thickening, at least in part chronic.  A superimposed atypical infection or interstitial edema is not excluded.  Bibasilar opacities; atelectasis versus infiltrate.   Original Report Authenticated By: Jearld Lesch, M.D.     ASSESSMENT AND PLAN   1.  Atrial fib  -- resolved. 2.  TSH is low  -- ?hyperthyroid or "sick" findings.  Would repeat value given findings.  It impacts his af also. 3.  Elevated BUN/Cr-------would consider renal US to ro hydro due to delayed emptying, also consider nephrology for follow up.  4.  Aortic stenosis  -- moderate level  -- will need close follow up as OP.  5.  Atrial fib  -- started on warfarin due to multiple risk factors  -- lives in [place] so hard to get to [facility] options for this, but he will need to be tightly controlled.    Signed, Shawnie Pons MD, [facility], FSCAI

## 2012-10-26 NOTE — Progress Notes (Addendum)
ANTICOAGULATION CONSULT NOTE - Follow Up Consult  Pharmacy Consult for Heparin --> Coumadin Indication: atrial fibrillation  Allergies  Allergen Reactions  . Ceftriaxone Sodium In Dextrose Other (See Comments)    redness and lip swelling    Patient Measurements: Height: 5\' 9"  (175.3 cm) Weight: 206 lb 1.6 oz (93.486 kg) (scale B) IBW/kg (Calculated) : 70.7 Heparin Dosing Weight: 87 kg  Vital Signs: Temp: 97.7 F (36.5 C) (03/25 0555) Temp src: Oral (03/25 0555) BP: 158/76 mmHg (03/25 0555) Pulse Rate: 61 (03/25 0555)  Labs:  Recent Labs  10/23/12 1400 10/23/12 1816  10/24/12 0445 10/24/12 2143 10/25/12 0454 10/26/12 0540  HGB  --   --   < > 11.4*  --  11.5* 12.0*  HCT  --   --   --  35.1*  --  35.0* 37.4*  PLT  --   --   --  84*  --  98* 116*  LABPROT  --   --   --   --   --  13.9 15.1  INR  --   --   --   --   --  1.08 1.21  HEPARINUNFRC  --   --   --   --  0.39 0.34  --   CREATININE  --   --   --  1.68*  --  1.46* 1.40*  TROPONINI <0.30 <0.30  --   --   --   --   --   < > = values in this interval not displayed.  Estimated Creatinine Clearance: 46.7 ml/min (by C-G formula based on Cr of 1.4).   Medications:  Scheduled:  . aspirin EC  81 mg Oral Daily  . atorvastatin  20 mg Oral QHS  . coumadin book   Does not apply Once  . diltiazem  30 mg Oral Q6H  . heparin subcutaneous  5,000 Units Subcutaneous Q8H  . ipratropium  0.5 mg Nebulization Q6H  . levalbuterol  0.63 mg Nebulization Q6H  . levofloxacin (LEVAQUIN) IV  750 mg Intravenous Q48H  . [COMPLETED] methylPREDNISolone (SOLU-MEDROL) injection  80 mg Intravenous Q6H  . omega-3 acid ethyl esters  1 g Oral BID  . pantoprazole  40 mg Oral Q1200  . predniSONE  60 mg Oral BID WC  . sodium chloride  3 mL Intravenous Q12H  . [COMPLETED] warfarin  7.5 mg Oral ONCE-1800  . Warfarin - Pharmacist Dosing Inpatient   Does not apply q1800  . [DISCONTINUED] levalbuterol  0.63 mg Nebulization QID   Infusions:  .  sodium chloride Stopped (10/25/12 1351)  . [DISCONTINUED] heparin Stopped (10/25/12 1150)    Assessment: 79 yo male on Coumadin for afib.  Pt  converted to NSR spontaneously and he continues on oral diltiazem. Heparin IV drip was discontinued 3/24 and changed to  VTE prophylaxis SQ heparin 5000 units SQ q8h.  INR today is 1.21, remains subtherapeutic as expected after only two doses of Coumadin but is increasing toward goal.  PLTC is 116K , increased from 98K;  H/H =12.0/37.4.  No bleeding reported. Currently on IV levofloxacin- may increase coumadin effect.   Goal of Therapy:  INR =2-3 Monitor platelets by anticoagulation protocol: Yes   Plan:  Coumadin 5 mg PO x 1 tonight. Daily INR  Noah Delaine, RPh Clinical Pharmacist Pager: [phone] 10/26/2012 10:27 AM

## 2012-10-26 NOTE — Progress Notes (Signed)
Discharge instructions given to pt  And spouse . Both verbalized understanding

## 2012-10-27 ENCOUNTER — Ambulatory Visit (INDEPENDENT_AMBULATORY_CARE_PROVIDER_SITE_OTHER): Payer: Medicare PPO | Admitting: Family Medicine

## 2012-10-27 ENCOUNTER — Telehealth: Payer: Self-pay | Admitting: Family Medicine

## 2012-10-27 ENCOUNTER — Other Ambulatory Visit: Payer: Self-pay | Admitting: Family Medicine

## 2012-10-27 ENCOUNTER — Encounter: Payer: Self-pay | Admitting: Family Medicine

## 2012-10-27 VITALS — BP 140/78 | HR 69 | Wt 214.0 lb

## 2012-10-27 DIAGNOSIS — J449 Chronic obstructive pulmonary disease, unspecified: Secondary | ICD-10-CM

## 2012-10-27 DIAGNOSIS — D696 Thrombocytopenia, unspecified: Secondary | ICD-10-CM

## 2012-10-27 DIAGNOSIS — I4891 Unspecified atrial fibrillation: Secondary | ICD-10-CM

## 2012-10-27 DIAGNOSIS — J441 Chronic obstructive pulmonary disease with (acute) exacerbation: Secondary | ICD-10-CM

## 2012-10-27 DIAGNOSIS — K219 Gastro-esophageal reflux disease without esophagitis: Secondary | ICD-10-CM

## 2012-10-27 DIAGNOSIS — I509 Heart failure, unspecified: Secondary | ICD-10-CM

## 2012-10-27 MED ORDER — IPRATROPIUM BROMIDE 0.02 % IN SOLN
0.5000 mg | Freq: Once | RESPIRATORY_TRACT | Status: AC
  Administered 2012-10-27: 0.5 mg via RESPIRATORY_TRACT

## 2012-10-27 MED ORDER — IPRATROPIUM BROMIDE 0.02 % IN SOLN
0.5000 mg | RESPIRATORY_TRACT | Status: DC
  Administered 2012-10-27: 0.5 mg via RESPIRATORY_TRACT

## 2012-10-27 MED ORDER — METHYLPREDNISOLONE ACETATE 80 MG/ML IJ SUSP
80.0000 mg | Freq: Once | INTRAMUSCULAR | Status: AC
  Administered 2012-10-27: 80 mg via INTRAMUSCULAR

## 2012-10-27 MED ORDER — ALBUTEROL SULFATE (5 MG/ML) 0.5% IN NEBU: 2.5000 mg | INHALATION_SOLUTION | RESPIRATORY_TRACT | Status: DC

## 2012-10-27 MED ORDER — IPRATROPIUM BROMIDE 0.02 % IN SOLN: 0.5000 mg | RESPIRATORY_TRACT | Status: DC

## 2012-10-27 MED ORDER — OMEPRAZOLE 20 MG PO CPDR: 20.0000 mg | DELAYED_RELEASE_CAPSULE | Freq: Every day | ORAL | Status: DC

## 2012-10-27 MED ORDER — ALBUTEROL SULFATE (5 MG/ML) 0.5% IN NEBU
2.5000 mg | INHALATION_SOLUTION | Freq: Once | RESPIRATORY_TRACT | Status: AC
  Administered 2012-10-27: 2.5 mg via RESPIRATORY_TRACT

## 2012-10-27 NOTE — Assessment & Plan Note (Signed)
Repeat labs Consider heme referral

## 2012-10-27 NOTE — Patient Instructions (Addendum)

## 2012-10-27 NOTE — Assessment & Plan Note (Signed)
Depo medrol given in office pred taper Finish meds given in hospital F/u pulmonary

## 2012-10-27 NOTE — Progress Notes (Signed)
  Subjective:    Joe Jackson is a 79 y.o. male with known COPD who presents with exacerbation. Current symptoms include: chronic dyspnea, non-productive cough, wheezing and he was d/c from hospital yesterday.. Symptoms began several hours ago ago. . Patient currently is not on home oxygen therapy..   The following portions of the patient's history were reviewed and updated as appropriate: allergies, current medications, past family history, past medical history, past social history, past surgical history and problem list.  Review of Systems Pertinent items are noted in HPI.     Objective:    BP 140/78  Pulse 69  Wt 214 lb (97.07 kg)  BMI 31.59 kg/m2  SpO2 95% Oxygen saturation 95% on room air BP 140/78  Pulse 69  Wt 214 lb (97.07 kg)  BMI 31.59 kg/m2  SpO2 95% General appearance: alert, cooperative, appears stated age and no distress Throat: lips, mucosa, and tongue normal; teeth and gums normal Neck: no adenopathy, supple, symmetrical, trachea midline and thyroid not enlarged, symmetric, no tenderness/mass/nodules Lungs: rhonchi bilaterally and wheezes bilaterally---cleared with duoneb Heart: S1, S2 normal     Assessment:    Acute exacerbation of chronic bronchitis    Plan:    Agricultural engineer distributed. Steroid burst per orders. Antibiotics per orders. finish meds given at hospital

## 2012-10-27 NOTE — Assessment & Plan Note (Signed)
F/u cardiology 

## 2012-10-27 NOTE — Addendum Note (Signed)
Addended by: Candie Echevaria L on: 10/27/2012 04:18 PM   Modules accepted: Orders

## 2012-10-27 NOTE — Assessment & Plan Note (Signed)
F/u cardiology Pt on coumadin
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
## 2012-10-28 ENCOUNTER — Ambulatory Visit (INDEPENDENT_AMBULATORY_CARE_PROVIDER_SITE_OTHER): Payer: Medicare PPO | Admitting: *Deleted

## 2012-10-28 DIAGNOSIS — I4891 Unspecified atrial fibrillation: Secondary | ICD-10-CM

## 2012-10-28 DIAGNOSIS — Z7901 Long term (current) use of anticoagulants: Secondary | ICD-10-CM | POA: Insufficient documentation

## 2012-10-28 DIAGNOSIS — I480 Paroxysmal atrial fibrillation: Secondary | ICD-10-CM

## 2012-10-28 DIAGNOSIS — D696 Thrombocytopenia, unspecified: Secondary | ICD-10-CM

## 2012-10-28 DIAGNOSIS — I219 Acute myocardial infarction, unspecified: Secondary | ICD-10-CM | POA: Insufficient documentation

## 2012-10-28 LAB — POCT INR: INR: 2.7

## 2012-10-28 NOTE — Telephone Encounter (Signed)
This Rx was sent yesterday.      KP

## 2012-10-28 NOTE — Patient Instructions (Signed)

## 2012-10-29 LAB — CULTURE, BLOOD (ROUTINE X 2)

## 2012-11-01 ENCOUNTER — Ambulatory Visit (INDEPENDENT_AMBULATORY_CARE_PROVIDER_SITE_OTHER): Payer: Medicare PPO | Admitting: *Deleted

## 2012-11-01 DIAGNOSIS — Z7901 Long term (current) use of anticoagulants: Secondary | ICD-10-CM

## 2012-11-01 DIAGNOSIS — I4891 Unspecified atrial fibrillation: Secondary | ICD-10-CM

## 2012-11-01 DIAGNOSIS — I219 Acute myocardial infarction, unspecified: Secondary | ICD-10-CM

## 2012-11-01 DIAGNOSIS — D696 Thrombocytopenia, unspecified: Secondary | ICD-10-CM

## 2012-11-01 DIAGNOSIS — I480 Paroxysmal atrial fibrillation: Secondary | ICD-10-CM

## 2012-11-01 LAB — POCT INR: INR: 2.2

## 2012-11-08 ENCOUNTER — Ambulatory Visit (INDEPENDENT_AMBULATORY_CARE_PROVIDER_SITE_OTHER): Payer: Medicare PPO | Admitting: *Deleted

## 2012-11-08 DIAGNOSIS — I4891 Unspecified atrial fibrillation: Secondary | ICD-10-CM

## 2012-11-08 DIAGNOSIS — I219 Acute myocardial infarction, unspecified: Secondary | ICD-10-CM

## 2012-11-08 DIAGNOSIS — D696 Thrombocytopenia, unspecified: Secondary | ICD-10-CM

## 2012-11-08 DIAGNOSIS — I480 Paroxysmal atrial fibrillation: Secondary | ICD-10-CM

## 2012-11-08 DIAGNOSIS — Z7901 Long term (current) use of anticoagulants: Secondary | ICD-10-CM

## 2012-11-08 LAB — POCT INR: INR: 2.5

## 2012-11-11 ENCOUNTER — Telehealth: Payer: Self-pay | Admitting: Family Medicine

## 2012-11-11 NOTE — Telephone Encounter (Signed)
Patient Information:  Caller Name: Joe Jackson  Phone: [phone]  Patient: Joe Jackson, Joe Jackson  Gender: Male  DOB: [birth year]  Age: 79 Years  PCP: Lelon Perla.  Office Follow Up:  Does the office need to follow up with this patient?: YES   Instructions For The Office: Patient refuses ED; please advise if you will see him in office or reinforce need to go to ED.  Patient agrees to come to office; no appointments with PCP until 14:30.  Caller states if patient must go to ED, to please have staff call him at [phone] to inform him as well as calling her.  Thank you.    RN Note:  Advised ED per nursing judgement due to complex and multiple health concerns with elderly patient.  Symptoms  Reason For Call & Symptoms: Spouse reports he has been diagnosed with Atrial Fibrillation recently, on Comadin and has been on Prednisone, has GI bug with diarrhea and vomiting onset 11/08/12.  Legs with pitting edema reported.  History of CHF.  Relates BP Lisinopril HCTZ was not resumed when he was discharged.   Edema onset 11/10/12 with right leg more swollen than left.  Blood with passing stool - one episode 10/09/12 and diarrhea; relates he c/o feeling lightheaded.  Emergent symptoms ruled out.  Abdominal bloating reported.  Advised ED per nursing judgment due to complex medical history and advanced age.  Reviewed Health History In EMR: Yes  Reviewed Medications In EMR: Yes  Reviewed Allergies In EMR: Yes  Reviewed Surgeries / Procedures: Yes  Date of Onset of Symptoms: 11/10/2012  Guideline(s) Used:  Leg Swelling and Edema  Diarrhea  Disposition Per Guideline:   Go to ED Now (or to Office with PCP Approval)  Reason For Disposition Reached:   Drinking very little and has signs of dehydration (e.g., no urine > 12 hours, very dry mouth, very lightheaded)  Advice Given:  Call Back If:  You become worse.  RN Overrode Recommendation:  Go To ED  Due to advanced age and multiple health issues, possible  dehydration.

## 2012-11-11 NOTE — Telephone Encounter (Signed)
Called pt back after he called into [facility]. Patient states he is feeling better and has been able to eat & drink without it coming back up. No more episodes of diarrhea or vomiting. Pt states legs are swollen, advised to sit in recliner and elevate them as much as possible. Pt states he was "getting ready to go to the store and get something to eat" at the time of my call. Patient declined appt stating that "he didn't feel like he needed to see the dr." Advised pt to call back if he changed his mind, he verbalized understanding and agreed to call back if needed.

## 2012-11-12 ENCOUNTER — Encounter (HOSPITAL_COMMUNITY): Payer: Self-pay | Admitting: Emergency Medicine

## 2012-11-12 ENCOUNTER — Emergency Department (HOSPITAL_COMMUNITY): Payer: Medicare PPO

## 2012-11-12 ENCOUNTER — Ambulatory Visit (INDEPENDENT_AMBULATORY_CARE_PROVIDER_SITE_OTHER): Payer: Medicare PPO | Admitting: Family Medicine

## 2012-11-12 ENCOUNTER — Inpatient Hospital Stay (HOSPITAL_COMMUNITY)
Admission: EM | Admit: 2012-11-12 | Discharge: 2012-11-18 | DRG: 291 | Disposition: A | Discharge status: Home Health | Payer: Medicare PPO | Attending: Internal Medicine | Admitting: Internal Medicine

## 2012-11-12 ENCOUNTER — Encounter: Payer: Self-pay | Admitting: Family Medicine

## 2012-11-12 VITALS — BP 139/89 | HR 94 | Temp 98.4°F | Wt 205.6 lb

## 2012-11-12 DIAGNOSIS — I10 Essential (primary) hypertension: Secondary | ICD-10-CM

## 2012-11-12 DIAGNOSIS — I2510 Atherosclerotic heart disease of native coronary artery without angina pectoris: Secondary | ICD-10-CM | POA: Diagnosis present

## 2012-11-12 DIAGNOSIS — R0602 Shortness of breath: Secondary | ICD-10-CM

## 2012-11-12 DIAGNOSIS — D696 Thrombocytopenia, unspecified: Secondary | ICD-10-CM

## 2012-11-12 DIAGNOSIS — Z7982 Long term (current) use of aspirin: Secondary | ICD-10-CM

## 2012-11-12 DIAGNOSIS — Z7901 Long term (current) use of anticoagulants: Secondary | ICD-10-CM

## 2012-11-12 DIAGNOSIS — I359 Nonrheumatic aortic valve disorder, unspecified: Secondary | ICD-10-CM | POA: Diagnosis present

## 2012-11-12 DIAGNOSIS — J449 Chronic obstructive pulmonary disease, unspecified: Secondary | ICD-10-CM | POA: Diagnosis present

## 2012-11-12 DIAGNOSIS — I5023 Acute on chronic systolic (congestive) heart failure: Secondary | ICD-10-CM

## 2012-11-12 DIAGNOSIS — Z79899 Other long term (current) drug therapy: Secondary | ICD-10-CM

## 2012-11-12 DIAGNOSIS — I252 Old myocardial infarction: Secondary | ICD-10-CM

## 2012-11-12 DIAGNOSIS — J9620 Acute and chronic respiratory failure, unspecified whether with hypoxia or hypercapnia: Secondary | ICD-10-CM | POA: Diagnosis present

## 2012-11-12 DIAGNOSIS — J4489 Other specified chronic obstructive pulmonary disease: Secondary | ICD-10-CM | POA: Diagnosis present

## 2012-11-12 DIAGNOSIS — I4891 Unspecified atrial fibrillation: Secondary | ICD-10-CM | POA: Diagnosis present

## 2012-11-12 DIAGNOSIS — I5043 Acute on chronic combined systolic (congestive) and diastolic (congestive) heart failure: Principal | ICD-10-CM | POA: Diagnosis present

## 2012-11-12 DIAGNOSIS — G4733 Obstructive sleep apnea (adult) (pediatric): Secondary | ICD-10-CM | POA: Diagnosis present

## 2012-11-12 DIAGNOSIS — J8410 Pulmonary fibrosis, unspecified: Secondary | ICD-10-CM | POA: Diagnosis present

## 2012-11-12 DIAGNOSIS — Z87891 Personal history of nicotine dependence: Secondary | ICD-10-CM

## 2012-11-12 DIAGNOSIS — R197 Diarrhea, unspecified: Secondary | ICD-10-CM | POA: Diagnosis present

## 2012-11-12 DIAGNOSIS — R600 Localized edema: Secondary | ICD-10-CM | POA: Diagnosis present

## 2012-11-12 DIAGNOSIS — K922 Gastrointestinal hemorrhage, unspecified: Secondary | ICD-10-CM

## 2012-11-12 DIAGNOSIS — I2589 Other forms of chronic ischemic heart disease: Secondary | ICD-10-CM | POA: Diagnosis present

## 2012-11-12 DIAGNOSIS — K921 Melena: Secondary | ICD-10-CM | POA: Diagnosis present

## 2012-11-12 DIAGNOSIS — E785 Hyperlipidemia, unspecified: Secondary | ICD-10-CM | POA: Diagnosis present

## 2012-11-12 DIAGNOSIS — J189 Pneumonia, unspecified organism: Secondary | ICD-10-CM | POA: Diagnosis present

## 2012-11-12 DIAGNOSIS — I509 Heart failure, unspecified: Secondary | ICD-10-CM | POA: Diagnosis present

## 2012-11-12 DIAGNOSIS — B370 Candidal stomatitis: Secondary | ICD-10-CM | POA: Diagnosis present

## 2012-11-12 LAB — URINALYSIS, ROUTINE W REFLEX MICROSCOPIC
Bilirubin Urine: NEGATIVE
Glucose, UA: NEGATIVE mg/dL
Protein, ur: 100 mg/dL — AB
Urobilinogen, UA: 1 mg/dL (ref 0.0–1.0)

## 2012-11-12 LAB — CBC
MCH: 27.5 pg (ref 26.0–34.0)
MCHC: 33 g/dL (ref 30.0–36.0)
MCV: 83.4 fL (ref 78.0–100.0)
Platelets: 93 10*3/uL — ABNORMAL LOW (ref 150–400)
RDW: 17.1 % — ABNORMAL HIGH (ref 11.5–15.5)

## 2012-11-12 LAB — TROPONIN I
Troponin I: <0.3 ng/mL (ref <0.30)
Troponin I: <0.3 ng/mL (ref <0.30)

## 2012-11-12 LAB — BASIC METABOLIC PANEL
Calcium: 8.5 mg/dL (ref 8.4–10.5)
Creatinine, Ser: 1.06 mg/dL (ref 0.50–1.35)
GFR calc Af Amer: 74 mL/min — ABNORMAL LOW (ref >90)

## 2012-11-12 LAB — URINE MICROSCOPIC-ADD ON

## 2012-11-12 LAB — PROTIME-INR: INR: 3.01 — ABNORMAL HIGH (ref 0.00–1.49)

## 2012-11-12 MED ORDER — MORPHINE SULFATE 2 MG/ML IJ SOLN: 1.0000 mg | INTRAMUSCULAR | Status: DC | PRN

## 2012-11-12 MED ORDER — ATORVASTATIN CALCIUM 20 MG PO TABS
20.0000 mg | ORAL_TABLET | Freq: Every day | ORAL | Status: DC
  Administered 2012-11-12 – 2012-11-17 (×6): 20 mg via ORAL
  Filled 2012-11-12 (×8): qty 1

## 2012-11-12 MED ORDER — LEVALBUTEROL HCL 0.63 MG/3ML IN NEBU
0.6300 mg | INHALATION_SOLUTION | RESPIRATORY_TRACT | Status: DC | PRN
  Administered 2012-11-14 – 2012-11-16 (×5): 0.63 mg via RESPIRATORY_TRACT
  Filled 2012-11-12 (×6): qty 3

## 2012-11-12 MED ORDER — SODIUM CHLORIDE 0.9 % IV SOLN
250.0000 mL | INTRAVENOUS | Status: DC | PRN
  Administered 2012-11-17: 250 mL via INTRAVENOUS

## 2012-11-12 MED ORDER — LORATADINE 10 MG PO TABS
10.0000 mg | ORAL_TABLET | Freq: Every day | ORAL | Status: DC
  Administered 2012-11-12 – 2012-11-18 (×7): 10 mg via ORAL
  Filled 2012-11-12 (×7): qty 1

## 2012-11-12 MED ORDER — ACETAMINOPHEN 325 MG PO TABS: 650.0000 mg | ORAL_TABLET | Freq: Four times a day (QID) | ORAL | Status: DC | PRN

## 2012-11-12 MED ORDER — OXYCODONE HCL 5 MG PO TABS: 5.0000 mg | ORAL_TABLET | ORAL | Status: DC | PRN

## 2012-11-12 MED ORDER — OMEGA-3-ACID ETHYL ESTERS 1 G PO CAPS
1.0000 g | ORAL_CAPSULE | Freq: Every day | ORAL | Status: DC
  Administered 2012-11-12 – 2012-11-18 (×7): 1 g via ORAL
  Filled 2012-11-12 (×7): qty 1

## 2012-11-12 MED ORDER — SODIUM CHLORIDE 0.9 % IV SOLN
INTRAVENOUS | Status: DC
  Administered 2012-11-12: 20:00:00 via INTRAVENOUS

## 2012-11-12 MED ORDER — ADULT MULTIVITAMIN W/MINERALS CH
1.0000 | ORAL_TABLET | Freq: Every day | ORAL | Status: DC
  Administered 2012-11-12 – 2012-11-18 (×7): 1 via ORAL
  Filled 2012-11-12 (×7): qty 1

## 2012-11-12 MED ORDER — ONDANSETRON HCL 4 MG PO TABS: 4.0000 mg | ORAL_TABLET | Freq: Four times a day (QID) | ORAL | Status: DC | PRN

## 2012-11-12 MED ORDER — BUDESONIDE-FORMOTEROL FUMARATE 160-4.5 MCG/ACT IN AERO
2.0000 | INHALATION_SPRAY | Freq: Two times a day (BID) | RESPIRATORY_TRACT | Status: DC
  Administered 2012-11-13 – 2012-11-18 (×11): 2 via RESPIRATORY_TRACT
  Filled 2012-11-12: qty 6

## 2012-11-12 MED ORDER — SODIUM CHLORIDE 0.9 % IJ SOLN: 3.0000 mL | INTRAMUSCULAR | Status: DC | PRN

## 2012-11-12 MED ORDER — FUROSEMIDE 10 MG/ML IJ SOLN
INTRAMUSCULAR | Status: AC
  Filled 2012-11-12: qty 4

## 2012-11-12 MED ORDER — LEVOFLOXACIN IN D5W 750 MG/150ML IV SOLN
750.0000 mg | INTRAVENOUS | Status: DC
  Administered 2012-11-12 – 2012-11-13 (×2): 750 mg via INTRAVENOUS
  Filled 2012-11-12 (×3): qty 150

## 2012-11-12 MED ORDER — DILTIAZEM HCL 100 MG IV SOLR
5.0000 mg/h | INTRAVENOUS | Status: DC
  Administered 2012-11-12: 5 mg/h via INTRAVENOUS
  Administered 2012-11-13 (×2): 15 mg/h via INTRAVENOUS

## 2012-11-12 MED ORDER — ONDANSETRON HCL 4 MG/2ML IJ SOLN: 4.0000 mg | Freq: Four times a day (QID) | INTRAMUSCULAR | Status: DC | PRN

## 2012-11-12 MED ORDER — FUROSEMIDE 10 MG/ML IJ SOLN
20.0000 mg | Freq: Once | INTRAMUSCULAR | Status: AC
  Administered 2012-11-12: 20 mg via INTRAVENOUS

## 2012-11-12 MED ORDER — ASPIRIN EC 81 MG PO TBEC
81.0000 mg | DELAYED_RELEASE_TABLET | Freq: Every day | ORAL | Status: DC
  Administered 2012-11-12 – 2012-11-15 (×4): 81 mg via ORAL
  Filled 2012-11-12 (×4): qty 1

## 2012-11-12 MED ORDER — PANTOPRAZOLE SODIUM 40 MG PO TBEC
40.0000 mg | DELAYED_RELEASE_TABLET | Freq: Every day | ORAL | Status: DC
  Administered 2012-11-12 – 2012-11-18 (×7): 40 mg via ORAL
  Filled 2012-11-12 (×7): qty 1

## 2012-11-12 MED ORDER — DILTIAZEM HCL 25 MG/5ML IV SOLN
10.0000 mg | Freq: Once | INTRAVENOUS | Status: AC
  Administered 2012-11-12: 10 mg via INTRAVENOUS

## 2012-11-12 MED ORDER — ACETAMINOPHEN 650 MG RE SUPP: 650.0000 mg | Freq: Four times a day (QID) | RECTAL | Status: DC | PRN

## 2012-11-12 MED ORDER — SODIUM CHLORIDE 0.9 % IJ SOLN
3.0000 mL | Freq: Two times a day (BID) | INTRAMUSCULAR | Status: DC
  Administered 2012-11-12 – 2012-11-17 (×11): 3 mL via INTRAVENOUS

## 2012-11-12 MED ORDER — VITAMIN B-12 100 MCG PO TABS
100.0000 ug | ORAL_TABLET | Freq: Every day | ORAL | Status: DC
  Administered 2012-11-12 – 2012-11-18 (×7): 100 ug via ORAL
  Filled 2012-11-12 (×7): qty 1

## 2012-11-12 NOTE — ED Notes (Signed)
Pt returned from radiology- IV out of forearm, IV pump with Cardizem infusion turned off.  Unknown how many mL's pt received before IV came out.
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
## 2012-11-12 NOTE — ED Notes (Addendum)
Pt brought to ED via [facility] from PCP [place] office on [address].  Pt presents with increased fatigue and peripheral edema for the past 2 weeks- hx of CHF.  Upon arrival to ED pt found to be in a-fib 140's on monitor.  Pt SOB at present, 2 L O2 applied

## 2012-11-12 NOTE — Progress Notes (Signed)
  Subjective:    Patient ID: Joe Jackson, male    DOB: August 17, 1929, 79 y.o.   MRN: [id number]  HPI Pt arrived with wife with increasing sob and swelling. Pt also recently had the norovirus and rectal bleeding.  Pt denies cp.  Pt d/c from hospital 10/23/2012--d/c summary reviewed  Review of Systems As above     Objective:   Physical Exam  BP 139/89  Pulse 94  Temp(Src) 98.4 F (36.9 C) (Oral)  Wt 205 lb 9.6 oz (93.26 kg)  BMI 30.35 kg/m2  SpO2 95% General appearance: alert, cooperative and moderate distress---pulse ox 85% on arrival--- with rest up to 94%  ---put on 2L O2 Lungs: diminished breath sounds bilaterally Heart: irregularly irregular rhythm Abdomen: abnormal findings:  distended and ecchymosis Extremities: edema + pitting edema R>L       Assessment & Plan:  Sob, with afib-----  ems called to transport pt to hospital for further evaluation

## 2012-11-12 NOTE — H&P (Signed)
PCP:   Loreen Freud, DO   Chief Complaint:  Weakness, shortness of breath.   HPI: This is an 79 year old male, with known history of HTN, dyslipidemia, chronic combined systolic/diastolic CHF, EF 16%-10% per echocardiogram 10/2012,  AAA, CAD, s/p MI, ischemic cardiomyopathy, PAF, pulmonary fibrosis, PFTs-1/09 FVC 60%, ratio 81, COPD, DJD, s/p neck and back surgery. Patient is s/p hospitalization 10/23/12-10/26/12 for newly diagnosed atrial fibrillation, and COPD exacerbation, complicated by acute on chronic respiratory failure and was commenced on coumadin anticoagulation. According to patient, he finished his steroid taper today. Per patient, since discharge, he has felt quite weak, and this has become progressively worse. He had no appetite initially, but as of today, appetite has improved. On 11/10/12, he had about 5-6 diarrheal stools, which appeared bloody, but this resolved the same day, and he vomited once on that date. He had no abdominal pain, has had no fever, chills or chest pain, and is no more short of breath than usual. On 11/11/12, patient noticed that right LE was somewhat swollen. He went to see his PMD, and was found to be in rapid atrial fibrillation in PMD's office. He was brought to the ED via EMS.   Allergies:   Allergies  Allergen Reactions  . Ceftriaxone Sodium In Dextrose Other (See Comments)    redness and lip swelling      Past Medical History  Diagnosis Date  . Hypertension   . Hyperlipidemia   . Chronic combined systolic and diastolic CHF (congestive heart failure)     a. 10/2012 Echo: EF 40-45%, Gr1 dd, postlat AK, mild to mod AS, Triv AI, Mild MR, mildly dil LA.  . Sleep apnea   . Aortic aneurysm, abdominal   . Pulmonary fibrosis   . Ischemic cardiomyopathy     a. EF 40-45% by echo 10/2012  . CAD (coronary artery disease)     a. s/p prior MI;  b. 11/1999 Cath: LM nl, LAD 68m, LCX min irregs, OM 100, RCA 80p, 100d, L->L and L->R collats, EF 54%.  Marland Kitchen COPD (chronic  obstructive pulmonary disease)   . PAF (paroxysmal atrial fibrillation)     a. Dx 10/2012 during hosp for resp failure/copd flare    Past Surgical History  Procedure Laterality Date  . Neck surgery    . Arm surgery    . Back surgery    . Leg surgery      Prior to Admission medications   Medication Sig Start Date End Date Taking? Authorizing Provider  aspirin EC 81 MG tablet Take 81 mg by mouth daily.   Yes Historical Provider, MD  atorvastatin (LIPITOR) 40 MG tablet Take 0.5 tablets (20 mg total) by mouth at bedtime. 04/14/12  Yes Yvonne R Lowne, DO  budesonide-formoterol (SYMBICORT) 160-4.5 MCG/ACT inhaler Inhale 2 puffs into the lungs 2 (two) times daily. 04/14/12  Yes Yvonne R Lowne, DO  cetirizine (ZYRTEC) 10 MG tablet Take 10 mg by mouth daily.     Yes Historical Provider, MD  Coenzyme Q10 (CO Q 10 PO) Take 1 tablet by mouth daily.    Yes Historical Provider, MD  Cyanocobalamin (VITAMIN B-12 PO) Take 1 tablet by mouth daily.    Yes Historical Provider, MD  diltiazem (CARDIZEM CD) 120 MG 24 hr capsule Take 1 capsule (120 mg total) by mouth daily. 10/26/12  Yes Henderson Cloud, MD  Garlic (ODOR FREE GARLIC) 100 MG TABS Take 1 tablet by mouth daily.     Yes Historical Provider,  MD  guaiFENesin (MUCINEX) 600 MG 12 hr tablet Take 1,200 mg by mouth daily.    Yes Historical Provider, MD  levalbuterol Pauline Aus) 0.63 MG/3ML nebulizer solution Take 3 mLs (0.63 mg total) by nebulization every 2 (two) hours as needed for wheezing or shortness of breath. 10/26/12  Yes Estela Isaiah Blakes, MD  Multiple Vitamin (MULITIVITAMIN WITH MINERALS) TABS Take 1 tablet by mouth daily.   Yes Historical Provider, MD  Omega-3 Fatty Acids (FISH OIL PO) Take 1 capsule by mouth daily.    Yes Historical Provider, MD  omeprazole (PRILOSEC) 20 MG capsule Take 1 capsule (20 mg total) by mouth daily. 10/27/12  Yes Yvonne R Lowne, DO  predniSONE (DELTASONE) 10 MG tablet Take 1 tablet (10 mg total) by mouth  daily. 6 tablets twice a day for 3 days, then 5 tablets BID for 3 days, then 4 tablets BID for 3 days, then 3 tablets BID for 3 days, then 2 tablets BID for 3 days, then 1 tablet BID for 3 days, then discontinue. 10/26/12  Yes Henderson Cloud, MD  warfarin (COUMADIN) 5 MG tablet Take 5 mg by mouth 2 (two) times daily. 10/26/12  Yes Estela Isaiah Blakes, MD    Social History: Patient reports that he quit smoking about 15 months ago. His smoking use included Cigarettes. He has a 62.4 pack-year smoking history. His smokeless tobacco use includes Chew. He reports that he does not drink alcohol or use illicit drugs.  Family History  Problem Relation Age of Onset  . Heart disease Mother     Heart Disease before age  20  . Other Mother     Rheumatism  . Heart attack Mother   . Heart attack Father     Review of Systems:  As per HPI and chief complaint. Patent denies fatigue, diminished appetite, weight loss, fever, chills, headache, blurred vision, difficulty in speaking, dysphagia, chest pain, cough, shortness of breath beyond baseline, orthopnea, paroxysmal nocturnal dyspnea, nausea, diaphoresis, abdominal pain, belching, heartburn, hematemesis, melena, dysuria, nocturia, urinary frequency. The rest of the systems review is negative.  Physical Exam:  General:  Patient does not appear to be in obvious acute distress. Alert, communicative, fully oriented, talking in complete sentences, not short of breath at rest.  HEENT:  No clinical pallor, no jaundice, no conjunctival injection or discharge. Oral thrush is noted.  NECK:  Supple, JVP not seen, no carotid bruits, no palpable lymphadenopathy, no palpable goiter. CHEST:  No wheezes, but crackles are heard in the right base. HEART:  Sounds 1 and 2 heard, normal, irregular, high pitched systolic murmur. ABDOMEN:  Full, soft, no scars, non-tender, no palpable organomegaly, no palpable masses, normal bowel sounds. GENITALIA:  Not  examined. LOWER EXTREMITIES:  Has varicosities, mild pitting edema RLE, and this leg appears larger in circumference than LLE, which is quite unremarkable. Palpable peripheral pulses. MUSCULOSKELETAL SYSTEM:  Generalized osteoarthritic changes, otherwise, normal. CENTRAL NERVOUS SYSTEM:  No focal neurologic deficit on gross examination.  Labs on Admission:  Results for orders placed during the hospital encounter of 11/12/12 (from the past 48 hour(s))  CBC     Status: Abnormal   Collection Time    11/12/12  4:52 PM      Result Value Range   WBC 13.0 (*) 4.0 - 10.5 K/uL   RBC 4.51  4.22 - 5.81 MIL/uL   Hemoglobin 12.4 (*) 13.0 - 17.0 g/dL   HCT 11.9 (*) 14.7 - 82.9 %   MCV  83.4  78.0 - 100.0 fL   MCH 27.5  26.0 - 34.0 pg   MCHC 33.0  30.0 - 36.0 g/dL   RDW 16.1 (*) 09.6 - 04.5 %   Platelets 93 (*) 150 - 400 K/uL   Comment: PLATELET COUNT CONFIRMED BY SMEAR     REPEATED TO VERIFY     SPECIMEN CHECKED FOR CLOTS  BASIC METABOLIC PANEL     Status: Abnormal   Collection Time    11/12/12  4:52 PM      Result Value Range   Sodium 137  135 - 145 mEq/L   Potassium 4.9  3.5 - 5.1 mEq/L   Comment: HEMOLYSIS AT THIS LEVEL MAY AFFECT RESULT   Chloride 105  96 - 112 mEq/L   CO2 23  19 - 32 mEq/L   Glucose, Bld 151 (*) 70 - 99 mg/dL   BUN 40 (*) 6 - 23 mg/dL   Creatinine, Ser 4.09  0.50 - 1.35 mg/dL   Calcium 8.5  8.4 - 81.1 mg/dL   GFR calc non Af Amer 64 (*) >90 mL/min   GFR calc Af Amer 74 (*) >90 mL/min   Comment:            The eGFR has been calculated     using the CKD EPI equation.     This calculation has not been     validated in all clinical     situations.     eGFR's persistently     <90 mL/min signify     possible Chronic Kidney Disease.  TROPONIN I     Status: None   Collection Time    11/12/12  4:52 PM      Result Value Range   Troponin I <0.30  <0.30 ng/mL   Comment:            Due to the release kinetics of cTnI,     a negative result within the first hours      of the onset of symptoms does not rule out     myocardial infarction with certainty.     If myocardial infarction is still suspected,     repeat the test at appropriate intervals.  PRO B NATRIURETIC PEPTIDE     Status: Abnormal   Collection Time    11/12/12  4:52 PM      Result Value Range   Pro B Natriuretic peptide (BNP) 3423.0 (*) 0 - 450 pg/mL  PROTIME-INR     Status: Abnormal   Collection Time    11/12/12  4:52 PM      Result Value Range   Prothrombin Time 29.6 (*) 11.6 - 15.2 seconds   INR 3.01 (*) 0.00 - 1.49  URINALYSIS, ROUTINE W REFLEX MICROSCOPIC     Status: Abnormal   Collection Time    11/12/12  5:03 PM      Result Value Range   Color, Urine YELLOW  YELLOW   APPearance CLEAR  CLEAR   Specific Gravity, Urine 1.022  1.005 - 1.030   pH 5.0  5.0 - 8.0   Glucose, UA NEGATIVE  NEGATIVE mg/dL   Hgb urine dipstick MODERATE (*) NEGATIVE   Bilirubin Urine NEGATIVE  NEGATIVE   Ketones, ur NEGATIVE  NEGATIVE mg/dL   Protein, ur 914 (*) NEGATIVE mg/dL   Urobilinogen, UA 1.0  0.0 - 1.0 mg/dL   Nitrite NEGATIVE  NEGATIVE   Leukocytes, UA NEGATIVE  NEGATIVE  URINE MICROSCOPIC-ADD ON  Status: None   Collection Time    11/12/12  5:03 PM      Result Value Range   Squamous Epithelial / LPF RARE  RARE   WBC, UA 0-2  <3 WBC/hpf   RBC / HPF 3-6  <3 RBC/hpf   Bacteria, UA RARE  RARE    Radiological Exams on Admission: Dg Chest 2 View  11/12/2012  *RADIOLOGY REPORT*  Clinical Data: Shortness of breath, weakness  CHEST - 2 VIEW  Comparison: 10/23/2012  Findings: Chronic interstitial markings.  Superimposed patchy/pleural based opacity in the lateral right lung, new, likely infectious/inflammatory.  Possible small right pleural effusion.  Cardiomegaly.  Mild degenerative changes of the visualized thoracolumbar spine.  IMPRESSION: Patchy/pleural based opacity in the lateral right lung, new, likely infectious/inflammatory.  Follow-up radiographs are suggested to document clearance.   Possible small right pleural effusion.  Underlying chronic interstitial markings.   Original Report Authenticated By: Charline Bills, M.D.     Assessment/Plan Active Problems:   1. Atrial fibrillation with RVR: Patient presented in rapid atrial fibrillation, with HR of 155. He was administered iv Cardizem infusion in the ED, with improvement in HR to 108-109. pateient was devoid of chest pain or SOB, and was hemodynamically stable, otherwise. Will continue ivi Cardizem. First set of cardiac enzymes, is negative. We shall complete cycling CEs and check TSH. Dr Freddrick March al, will be consulted. INR is currently just above therapeutic at 3.01. We shall invite pharmacy to manage.  2. CAP (community acquired pneumonia): CXR demonstrated a patchy/pleural based opacity in the lateral right lung, new, likely infectious/inflammatory. This is suspicious for CAP, although patient is afebrile and has no cough. Alternatively, this may be the result of aspiration from vomiting on 11/09/12. Wcc is elevated at 13.o. ED MD has commenced iv Levaquin, which we shall continue.  3. CHF (congestive heart failure): Patient has known history of  ischemic cardiomyopathy and chronic combined systolic/diastolic CHF, EF 16%-10% per echocardiogram 10/2012. Clinically, he does not appear to be in overt decompensation at this time.  4. HTN (hypertension): Controlled on ivi Cardizem.  5. CAD: Known CAD, s/p MI. No evidence of ACS at this time. Will complete cycling CEs.   6. COPD/Pulmonary fibrosis: Pateint just concluded his oral steroid taper today, and has no evidence of acute exacerbation at this time, so will not recommence steroids. Will manage with bronchodilators.  7. Diarrhea: Patient reports a transient episode of diarrhea with blood stools on 11/10/12, only. There has been no recurrence. He has no abdominal pain. Perhaps,this was a brief episode of mild ischemic colitis. We shall observe, and address if symptoms recur.  8.  Oral thrush: This was an incidental finding on physical examination. Likely due to steroids and antibiotics. We shall manage with Nystatin oral suspension.  9. Edema of leg: right LE edema noted since 11/11/12. Low suspicion for DVT, given therapeutic anticoagulation. Will do LE venous doppler.   Further management will depend on clinical course.   Comment: Patient is FULL CODE.      Time Spent on Admission: 1 hour.   Deshara Rossi,CHRISTOPHER 11/12/2012, 8:08 PM

## 2012-11-12 NOTE — ED Provider Notes (Signed)
History     CSN: [id number]  Arrival date & time 11/12/12  1632   First MD Initiated Contact with Patient 11/12/12 1634      Chief Complaint  Patient presents with  . Weakness    (Consider location/radiation/quality/duration/timing/severity/associated sxs/prior treatment) HPI Pt presenting with concern for lower extremity swelling, fatigue, mild shortness of breath.  Pt was seen earlier at his PMD office and was referred to the ED due to rapid afib.  He denies cough or fever.  Has been on steroids due to COPD exacerbation. Lower extremity swelling has been worsening over the past several days, symmetric.  He has been recently diagnosed with afib over the past month during most recent hospitalization.  He denies chest pain.  There are no other associated systemic symptoms, there are no other alleviating or modifying factors.   Past Medical History  Diagnosis Date  . Hypertension   . Hyperlipidemia   . Chronic combined systolic and diastolic CHF (congestive heart failure)     a. 10/2012 Echo: EF 40-45%, Gr1 dd, postlat AK, mild to mod AS, Triv AI, Mild MR, mildly dil LA.  . Sleep apnea   . Aortic aneurysm, abdominal   . Pulmonary fibrosis   . Ischemic cardiomyopathy     a. EF 40-45% by echo 10/2012  . CAD (coronary artery disease)     a. s/p prior MI;  b. 11/1999 Cath: LM nl, LAD 66m, LCX min irregs, OM 100, RCA 80p, 100d, L->L and L->R collats, EF 54%.  Marland Kitchen COPD (chronic obstructive pulmonary disease)   . PAF (paroxysmal atrial fibrillation)     a. Dx 10/2012 during hosp for resp failure/copd flare    Past Surgical History  Procedure Laterality Date  . Neck surgery    . Arm surgery    . Back surgery    . Leg surgery      Family History  Problem Relation Age of Onset  . Heart disease Mother     Heart Disease before age  68  . Other Mother     Rheumatism  . Heart attack Mother   . Heart attack Father     History  Substance Use Topics  . Smoking status: Former Smoker --  0.80 packs/day for 78 years    Types: Cigarettes    Quit date: 08/05/2011  . Smokeless tobacco: Current User    Types: Chew    Last Attempt to Quit: 08/11/2011  . Alcohol Use: No      Review of Systems ROS reviewed and all otherwise negative except for mentioned in HPI  Allergies  Ceftriaxone sodium in dextrose  Home Medications   No current outpatient prescriptions on file.  BP 118/41  Pulse 82  Temp(Src) 98.7 F (37.1 C) (Oral)  Resp 25  Ht 5\' 9"  (1.753 m)  Wt 200 lb 2.8 oz (90.8 kg)  BMI 29.55 kg/m2  SpO2 92% Vitals reviewed Physical Exam Physical Examination: General appearance - alert, chronically ill appearing, and in no distress Mental status - alert, oriented to person, place, and time Eyes - no scleral icterus, no conjunctival injection Mouth - mucous membranes moist, pharynx normal without lesions Chest - clear to auscultation, no wheezes, rales or rhonchi, symmetric air entry, decreased air movemement bilaterally but no increased respiratory effort, speaking in full sentences Heart - normal rate, regular rhythm, normal S1, S2, no murmurs, rubs, clicks or gallops Abdomen - soft, nontender, nondistended, no masses or organomegaly Extremities - peripheral pulses normal, no pedal edema,  no clubbing or cyanosis Skin - normal coloration and turgor, no rashes  ED Course  Procedures (including critical care time)   Date: 11/12/2012  Rate: 131  Rhythm: atrial fibrillation  QRS Axis: normal  Intervals: indeterminate  ST/T Wave abnormalities: nonspecific ST/T changes  Conduction Disutrbances:none  Narrative Interpretation:   Old EKG Reviewed: no significant change compared to prior ekg of 10/24/12   Labs Reviewed  CBC - Abnormal; Notable for the following:    WBC 13.0 (*)    Hemoglobin 12.4 (*)    HCT 37.6 (*)    RDW 17.1 (*)    Platelets 93 (*)    All other components within normal limits  BASIC METABOLIC PANEL - Abnormal; Notable for the following:     Glucose, Bld 151 (*)    BUN 40 (*)    GFR calc non Af Amer 64 (*)    GFR calc Af Amer 74 (*)    All other components within normal limits  PRO B NATRIURETIC PEPTIDE - Abnormal; Notable for the following:    Pro B Natriuretic peptide (BNP) 3423.0 (*)    All other components within normal limits  URINALYSIS, ROUTINE W REFLEX MICROSCOPIC - Abnormal; Notable for the following:    Hgb urine dipstick MODERATE (*)    Protein, ur 100 (*)    All other components within normal limits  PROTIME-INR - Abnormal; Notable for the following:    Prothrombin Time 29.6 (*)    INR 3.01 (*)    All other components within normal limits  CBC - Abnormal; Notable for the following:    WBC 11.2 (*)    RBC 4.19 (*)    Hemoglobin 11.4 (*)    HCT 35.1 (*)    RDW 17.2 (*)    Platelets 72 (*)    All other components within normal limits  COMPREHENSIVE METABOLIC PANEL - Abnormal; Notable for the following:    Glucose, Bld 107 (*)    BUN 39 (*)    Calcium 8.1 (*)    Total Protein 5.3 (*)    Albumin 2.3 (*)    AST 90 (*)    ALT 183 (*)    GFR calc non Af Amer 60 (*)    GFR calc Af Amer 70 (*)    All other components within normal limits  PROTIME-INR - Abnormal; Notable for the following:    Prothrombin Time 29.7 (*)    INR 3.02 (*)    All other components within normal limits  TROPONIN I  URINE MICROSCOPIC-ADD ON  TSH  TROPONIN I  TROPONIN I  TROPONIN I  PROTIME-INR    CRITICAL CARE Performed by: Ethelda Chick   Total critical care time: 35  Critical care time was exclusive of separately billable procedures and treating other patients.  Critical care was necessary to treat or prevent imminent or life-threatening deterioration.  Critical care was time spent personally by me on the following activities: development of treatment plan with patient and/or surrogate as well as nursing, discussions with consultants, evaluation of patient's response to treatment, examination of patient, obtaining  history from patient or surrogate, ordering and performing treatments and interventions, ordering and review of laboratory studies, ordering and review of radiographic studies, pulse oximetry and re-evaluation of patient's condition.  Medications  levofloxacin (LEVAQUIN) IVPB 750 mg (750 mg Intravenous New Bag/Given 11/13/12 1757)  0.9 %  sodium chloride infusion ( Intravenous Stopped 11/12/12 2042)  0.9 %  sodium chloride infusion ( Intravenous Stopped 11/13/12  1400)  pantoprazole (PROTONIX) EC tablet 40 mg (40 mg Oral Given 11/13/12 1052)  levalbuterol (XOPENEX) nebulizer solution 0.63 mg (not administered)  atorvastatin (LIPITOR) tablet 20 mg (20 mg Oral Given 11/12/12 2146)  budesonide-formoterol (SYMBICORT) 160-4.5 MCG/ACT inhaler 2 puff (2 puffs Inhalation Given 11/13/12 0737)  aspirin EC tablet 81 mg (81 mg Oral Given 11/13/12 1049)  vitamin B-12 (CYANOCOBALAMIN) tablet 100 mcg (100 mcg Oral Given 11/13/12 1049)  multivitamin with minerals tablet 1 tablet (1 tablet Oral Given 11/13/12 1049)  omega-3 acid ethyl esters (LOVAZA) capsule 1 g (1 g Oral Given 11/13/12 1049)  loratadine (CLARITIN) tablet 10 mg (10 mg Oral Given 11/13/12 1049)  sodium chloride 0.9 % injection 3 mL (3 mLs Intravenous Given 11/13/12 1053)  sodium chloride 0.9 % injection 3 mL (not administered)  0.9 %  sodium chloride infusion (not administered)  acetaminophen (TYLENOL) tablet 650 mg (not administered)    Or  acetaminophen (TYLENOL) suppository 650 mg (not administered)  oxyCODONE (Oxy IR/ROXICODONE) immediate release tablet 5 mg (not administered)  morphine 2 MG/ML injection 1 mg (not administered)  ondansetron (ZOFRAN) tablet 4 mg (not administered)    Or  ondansetron (ZOFRAN) injection 4 mg (not administered)  diltiazem (CARDIZEM) tablet 60 mg (60 mg Oral Given 11/13/12 1754)  bisoprolol (ZEBETA) tablet 2.5 mg (2.5 mg Oral Given 11/13/12 1442)  losartan (COZAAR) tablet 25 mg (25 mg Oral Given 11/13/12 1057)  furosemide  (LASIX) tablet 20 mg (20 mg Oral Given 11/13/12 1057)  enoxaparin (LOVENOX) injection 40 mg (not administered)  diltiazem (CARDIZEM) injection 10 mg (0 mg Intravenous Stopped 11/12/12 1716)  furosemide (LASIX) injection 20 mg (20 mg Intravenous Given 11/12/12 2147)  furosemide (LASIX) 10 MG/ML injection (  Duplicate 11/12/12 2145)   Dg Chest 2 View  11/12/2012  *RADIOLOGY REPORT*  Clinical Data: Shortness of breath, weakness  CHEST - 2 VIEW  Comparison: 10/23/2012  Findings: Chronic interstitial markings.  Superimposed patchy/pleural based opacity in the lateral right lung, new, likely infectious/inflammatory.  Possible small right pleural effusion.  Cardiomegaly.  Mild degenerative changes of the visualized thoracolumbar spine.  IMPRESSION: Patchy/pleural based opacity in the lateral right lung, new, likely infectious/inflammatory.  Follow-up radiographs are suggested to document clearance.  Possible small right pleural effusion.  Underlying chronic interstitial markings.   Original Report Authenticated By: Charline Bills, M.D.      1. Atrial fibrillation with RVR   2. CAD (coronary artery disease)   3. CAP (community acquired pneumonia)   4. Chronic airway obstruction, not elsewhere classified   5. Acute on chronic systolic heart failure   6. Acute and chronic respiratory failure       MDM  Pt presenting in afib with RVR, also increased lower extremity edema.  Pt placed on diltiazem and drip infusion.  CXR shows possible area of pneumonia- so started levaquin as well.  D/w [facility] for admission.  His INR is therapeutic       Ethelda Chick, MD 11/13/12 1805

## 2012-11-13 DIAGNOSIS — M7989 Other specified soft tissue disorders: Secondary | ICD-10-CM

## 2012-11-13 DIAGNOSIS — I4891 Unspecified atrial fibrillation: Secondary | ICD-10-CM

## 2012-11-13 DIAGNOSIS — I5023 Acute on chronic systolic (congestive) heart failure: Secondary | ICD-10-CM

## 2012-11-13 LAB — CBC
HCT: 35.1 % — ABNORMAL LOW (ref 39.0–52.0)
MCHC: 32.5 g/dL (ref 30.0–36.0)
Platelets: 72 10*3/uL — ABNORMAL LOW (ref 150–400)
RDW: 17.2 % — ABNORMAL HIGH (ref 11.5–15.5)

## 2012-11-13 LAB — COMPREHENSIVE METABOLIC PANEL
Albumin: 2.3 g/dL — ABNORMAL LOW (ref 3.5–5.2)
Alkaline Phosphatase: 91 U/L (ref 39–117)
BUN: 39 mg/dL — ABNORMAL HIGH (ref 6–23)
Potassium: 4.1 mEq/L (ref 3.5–5.1)
Total Protein: 5.3 g/dL — ABNORMAL LOW (ref 6.0–8.3)

## 2012-11-13 LAB — TROPONIN I
Troponin I: <0.3 ng/mL (ref <0.30)
Troponin I: <0.3 ng/mL (ref <0.30)

## 2012-11-13 LAB — PROTIME-INR
INR: 3.02 — ABNORMAL HIGH (ref 0.00–1.49)
Prothrombin Time: 29.7 seconds — ABNORMAL HIGH (ref 11.6–15.2)

## 2012-11-13 LAB — TSH: TSH: 0.772 u[IU]/mL (ref 0.350–4.500)

## 2012-11-13 MED ORDER — FUROSEMIDE 20 MG PO TABS
20.0000 mg | ORAL_TABLET | Freq: Every day | ORAL | Status: DC
  Administered 2012-11-13 – 2012-11-18 (×6): 20 mg via ORAL
  Filled 2012-11-13 (×6): qty 1

## 2012-11-13 MED ORDER — LOSARTAN POTASSIUM 25 MG PO TABS
25.0000 mg | ORAL_TABLET | Freq: Every day | ORAL | Status: DC
  Administered 2012-11-13 – 2012-11-18 (×6): 25 mg via ORAL
  Filled 2012-11-13 (×6): qty 1

## 2012-11-13 MED ORDER — DILTIAZEM HCL 60 MG PO TABS
60.0000 mg | ORAL_TABLET | Freq: Four times a day (QID) | ORAL | Status: DC
  Administered 2012-11-13 – 2012-11-14 (×4): 60 mg via ORAL
  Filled 2012-11-13 (×8): qty 1

## 2012-11-13 MED ORDER — ENOXAPARIN SODIUM 40 MG/0.4ML ~~LOC~~ SOLN
40.0000 mg | SUBCUTANEOUS | Status: DC
  Filled 2012-11-13: qty 0.4

## 2012-11-13 MED ORDER — BISOPROLOL FUMARATE 5 MG PO TABS
2.5000 mg | ORAL_TABLET | Freq: Every day | ORAL | Status: DC
  Administered 2012-11-13 – 2012-11-14 (×2): 2.5 mg via ORAL
  Filled 2012-11-13 (×2): qty 0.5

## 2012-11-13 NOTE — Progress Notes (Addendum)
Subjective:   79 y.o. male w/ PMHx significant for CAD s/p MI 2001, PVD, CHF with EF of 40-45%, COPD and recent diagnosis of paroxysmal afib who was admitted on 11/12/2012 with weakness and diarrhea. On admit had AF with RVR (started on dilt) and ? Of volume overload.   Got 1 dose IV lasix. Weight down 5 pounds. Feeling much better. Heart rate in the 80-90s on IV dilt at 15.    Intake/Output Summary (Last 24 hours) at 11/13/12 0940 Last data filed at 11/13/12 0800  Gross per 24 hour  Intake 709.42 ml  Output   1925 ml  Net -1215.58 ml    Current meds: . aspirin EC  81 mg Oral Daily  . atorvastatin  20 mg Oral QHS  . budesonide-formoterol  2 puff Inhalation BID  . levofloxacin  750 mg Intravenous Q24H  . loratadine  10 mg Oral Daily  . multivitamin with minerals  1 tablet Oral Daily  . omega-3 acid ethyl esters  1 g Oral Daily  . pantoprazole  40 mg Oral Daily  . sodium chloride  3 mL Intravenous Q12H  . vitamin B-12  100 mcg Oral Daily   Infusions: . sodium chloride Stopped (11/12/12 2042)  . sodium chloride 10 mL/hr at 11/12/12 2000  . diltiazem (CARDIZEM) infusion 15 mg/hr (11/13/12 0758)     Objective:  Blood pressure 138/68, pulse 108, temperature 98.1 F (36.7 C), temperature source Oral, resp. rate 20, height 5\' 9"  (1.753 m), weight 90.8 kg (200 lb 2.8 oz), SpO2 93.00%. Weight change:   Physical Exam: General: Well developed, well nourished, in no acute distress. Sitting in chair Head: Normocephalic, atraumatic, sclera non-icteric, no xanthomas, nares are without discharge.  Neck: Supple. Negative for carotid bruits. JVP difficult to appreciate  Lungs: Clear bilaterally to auscultation without wheezes, rales, or rhonchi. Breathing is unlabored.  Heart: irreg, irreg, 2/6 SEM RSB Abdomen: protuberant, nontender, with normoactive bowel sounds. No hepatomegaly. No rebound/guarding. No obvious abdominal masses.  Msk: Strength and tone appear normal for age.    Extremities: No clubbing or cyanosis. Warm and dry.no edema. Neuro: Alert and oriented X 3. Moves all extremities spontaneously.  Psych: Responds to questions appropriately with a normal affect.    Telemetry:  AF 80-100   Lab Results: Basic Metabolic Panel:  Recent Labs Lab 11/12/12 1652 11/13/12 0500  NA 137 139  K 4.9 4.1  CL 105 104  CO2 23 30  GLUCOSE 151* 107*  BUN 40* 39*  CREATININE 1.06 1.11  CALCIUM 8.5 8.1*   Liver Function Tests:  Recent Labs Lab 11/13/12 0500  AST 90*  ALT 183*  ALKPHOS 91  BILITOT 0.8  PROT 5.3*  ALBUMIN 2.3*   No results found for this basename: LIPASE, AMYLASE,  in the last 168 hours No results found for this basename: AMMONIA,  in the last 168 hours CBC:  Recent Labs Lab 11/12/12 1652 11/13/12 0500  WBC 13.0* 11.2*  HGB 12.4* 11.4*  HCT 37.6* 35.1*  MCV 83.4 83.8  PLT 93* 72*   Cardiac Enzymes:  Recent Labs Lab 11/12/12 1652 11/12/12 2104 11/13/12 0208  TROPONINI <0.30 <0.30 <0.30   BNP: No components found with this basename: POCBNP,  CBG: No results found for this basename: GLUCAP,  in the last 168 hours Microbiology: Lab Results  Component Value Date   CULT NORMAL OROPHARYNGEAL FLORA 10/23/2012   CULT NO GROWTH 5 DAYS 10/23/2012   CULT NO GROWTH 5 DAYS 10/23/2012  CULT NO GROWTH 5 DAYS 10/23/2012   CULT NO GROWTH 5 DAYS 10/23/2012   No results found for this basename: CULT, SDES,  in the last 168 hours  Imaging: Dg Chest 2 View  11/12/2012  *RADIOLOGY REPORT*  Clinical Data: Shortness of breath, weakness  CHEST - 2 VIEW  Comparison: 10/23/2012  Findings: Chronic interstitial markings.  Superimposed patchy/pleural based opacity in the lateral right lung, new, likely infectious/inflammatory.  Possible small right pleural effusion.  Cardiomegaly.  Mild degenerative changes of the visualized thoracolumbar spine.  IMPRESSION: Patchy/pleural based opacity in the lateral right lung, new, likely  infectious/inflammatory.  Follow-up radiographs are suggested to document clearance.  Possible small right pleural effusion.  Underlying chronic interstitial markings.   Original Report Authenticated By: Charline Bills, M.D.      ASSESSMENT:   1. Paroxysmal Afib with RVR  2. A/C systolic CHF,EF of 40-45% by recent echo, 3. Diarrheal illness  4. COPD 5. Aortic stenosis, mild to mod by echo    PLAN/DISCUSSION:  Volume status much improved with one dose IV lasix. Was not on lasix at home on d/c but I think he will at least need some standing diuretic. Will start lasix 20 daily. Can adjust as needed.   AF is rate controlled on IV dilt. Given LV dysfunction, I would prefer not to use diltiazem if possible but does have a h/o significant COPD and follows with Dr. Vassie Loll. FEV1 in 2013 was 1.95L (68%) so I think we can try some bisoprolol carefully. Will change diltiazem to po. Hopefully he will convert on his own but I suspect he may need anti-arrhythmic drug down the road.  Continue coumadin. Can consider switch to NOAC as outpatient.   Has been on lisinopril-HCTZ for a long time but stopped at last discharge. Given LV dysfunction will start losartan 25 as BP tolerates.    LOS: 1 day  Arvilla Meres, MD 11/13/2012, 9:40 AM

## 2012-11-13 NOTE — Evaluation (Signed)
Physical Therapy Evaluation Patient Details Name: Joe Jackson MRN: [id number] DOB: [birth year] Today's Date: 11/13/2012 Time: 8295-6213 PT Time Calculation (min): 23 min  PT Assessment / Plan / Recommendation Clinical Impression  Pt. presents to PT with generalized weakness, decreased activity tolerance and decondinioning from recent and present illnesses and will benefit from PT to address these and below issues.  Should be able to return home with 24 hour assist initially and progressing to PRN.  Educated pt. and wife on purpose and role of acute PT and likely need  for HHPT.  They express understanding and agreement.      PT Assessment  Patient needs continued PT services    Follow Up Recommendations  Home health PT;Supervision/Assistance - 24 hour;Other (comment) (initial 24 hour supervision progressing to PRN)    Does the patient have the potential to tolerate intense rehabilitation      Barriers to Discharge None      Equipment Recommendations  None recommended by PT    Recommendations for Other Services     Frequency Min 3X/week    Precautions / Restrictions Precautions Precautions: Fall Restrictions Weight Bearing Restrictions: No   Pertinent Vitals/Pain HR stable in 90s througout session, denies SOB, denies pain      Mobility  Bed Mobility Bed Mobility: Supine to Sit;Sitting - Scoot to Edge of Bed Supine to Sit: HOB elevated;6: Modified independent (Device/Increase time) Sitting - Scoot to Edge of Bed: 6: Modified independent (Device/Increase time) Details for Bed Mobility Assistance: safety cues to progress slowly Transfers Transfers: Sit to Stand;Stand to Sit Sit to Stand: 5: Supervision;With upper extremity assist;From bed Stand to Sit: 5: Supervision;To chair/3-in-1;With armrests Stand Pivot Transfers: 5: Supervision Details for Transfer Assistance: supervision for safety Ambulation/Gait Ambulation/Gait Assistance: Not tested (comment);Other (comment)  (pt. reports feeling weak from illnesses)    Exercises     PT Diagnosis: Abnormality of gait;Generalized weakness  PT Problem List: Decreased strength;Decreased activity tolerance;Decreased mobility;Cardiopulmonary status limiting activity PT Treatment Interventions: Gait training;Functional mobility training;Therapeutic activities;Therapeutic exercise;Patient/family education;DME instruction   PT Goals Acute Rehab PT Goals PT Goal Formulation: With patient Time For Goal Achievement: 11/20/12 Potential to Achieve Goals: Good Pt will go Supine/Side to Sit: Independently;with HOB 0 degrees PT Goal: Supine/Side to Sit - Progress: Goal set today Pt will go Sit to Stand: with modified independence;with upper extremity assist PT Goal: Sit to Stand - Progress: Goal set today Pt will Ambulate: 51 - 150 feet;with modified independence;with least restrictive assistive device PT Goal: Ambulate - Progress: Goal set today  Visit Information  Last PT Received On: 11/13/12 Assistance Needed: +1    Subjective Data  Subjective: "I want to get out of the bed to the chair" Patient Stated Goal: To go home soon.   Prior Functioning  Home Living Lives With: Spouse Available Help at Discharge: Family;Friend(s);Available 24 hours/day Type of Home: House Home Access: Stairs to enter [employer] of Steps: 1 Entrance Stairs-Rails: None Home Layout: Multi-level Alternate Level Stairs-Number of Steps: 2 Alternate Level Stairs-Rails: Right;Left;Can reach both Bathroom Shower/Tub: Engineer, manufacturing systems: Standard Bathroom Accessibility: Yes How Accessible: Accessible via walker Home Adaptive Equipment: Dan Humphreys - four wheeled;Bedside commode/3-in-1;Tub transfer bench;Quad cane;Straight cane Prior Function Level of Independence: Independent Able to Take Stairs?: Yes Driving: Yes Vocation: Retired Musician: No difficulties Dominant Hand: Right    Cognition   Cognition Overall Cognitive Status: Appears within functional limits for tasks assessed/performed Arousal/Alertness: Awake/alert Orientation Level: Appears intact for tasks assessed Behavior During Session: [facility]  for tasks performed    Extremity/Trunk Assessment Right Upper Extremity Assessment RUE ROM/Strength/Tone: WFL for tasks assessed RUE Sensation: WFL - Light Touch RUE Coordination: WFL - gross/fine motor Left Upper Extremity Assessment LUE ROM/Strength/Tone: WFL for tasks assessed LUE Sensation: WFL - Light Touch LUE Coordination: WFL - gross/fine motor Right Lower Extremity Assessment RLE ROM/Strength/Tone: WFL for tasks assessed RLE Sensation: WFL - Light Touch RLE Coordination: WFL - gross motor Left Lower Extremity Assessment LLE ROM/Strength/Tone: WFL for tasks assessed LLE Sensation: WFL - Light Touch LLE Coordination: WFL - gross motor Trunk Assessment Trunk Assessment: Normal   Balance Balance Balance Assessed: No  End of Session PT - End of Session Equipment Utilized During Treatment: Gait belt;Other (comment) (pt. on room air) Activity Tolerance: Patient tolerated treatment well Patient left: in chair;with call bell/phone within reach;with family/visitor present Nurse Communication: Mobility status  GP     Ferman Hamming 11/13/2012, 9:46 AM Weldon Picking PT Acute Rehab Services [phone] [place] [phone]

## 2012-11-13 NOTE — Progress Notes (Signed)
VASCULAR LAB PRELIMINARY  PRELIMINARY  PRELIMINARY  PRELIMINARY  Right lower extremity venous Doppler completed.    Preliminary report:  There is no DVT or SVT noted in the right lower extremity.  Vlad Mayberry, RVT 11/13/2012, 10:00 AM

## 2012-11-13 NOTE — Progress Notes (Addendum)
ANTICOAGULATION CONSULT NOTE - Initial Consult  Pharmacy Consult for Warfarin Indication: atrial fibrillation  Allergies  Allergen Reactions  . Ceftriaxone Sodium In Dextrose Other (See Comments)    redness and lip swelling    Patient Measurements: Height: 5\' 9"  (175.3 cm) Weight: 200 lb 2.8 oz (90.8 kg) IBW/kg (Calculated) : 70.7 Heparin Dosing Weight: n/a  Vital Signs: Temp: 98.1 F (36.7 C) (04/12 0800) Temp src: Oral (04/12 0800) BP: 138/68 mmHg (04/12 0600)  Labs:  Recent Labs  11/12/12 1652 11/12/12 2104 11/13/12 0208 11/13/12 0500  HGB 12.4*  --   --  11.4*  HCT 37.6*  --   --  35.1*  PLT 93*  --   --  72*  LABPROT 29.6*  --   --   --   INR 3.01*  --   --   --   CREATININE 1.06  --   --  1.11  TROPONINI <0.30 <0.30 <0.30  --     Estimated Creatinine Clearance: 58.1 ml/min (by C-G formula based on Cr of 1.11).   Medical History: Past Medical History  Diagnosis Date  . Hypertension   . Hyperlipidemia   . Chronic combined systolic and diastolic CHF (congestive heart failure)     a. 10/2012 Echo: EF 40-45%, Gr1 dd, postlat AK, mild to mod AS, Triv AI, Mild MR, mildly dil LA.  . Sleep apnea   . Aortic aneurysm, abdominal   . Pulmonary fibrosis   . Ischemic cardiomyopathy     a. EF 40-45% by echo 10/2012  . CAD (coronary artery disease)     a. s/p prior MI;  b. 11/1999 Cath: LM nl, LAD 39m, LCX min irregs, OM 100, RCA 80p, 100d, L->L and L->R collats, EF 54%.  Marland Kitchen COPD (chronic obstructive pulmonary disease)   . PAF (paroxysmal atrial fibrillation)     a. Dx 10/2012 during hosp for resp failure/copd flare    Medications:  Scheduled:  . aspirin EC  81 mg Oral Daily  . atorvastatin  20 mg Oral QHS  . bisoprolol  2.5 mg Oral Daily  . budesonide-formoterol  2 puff Inhalation BID  . [COMPLETED] diltiazem  10 mg Intravenous Once  . diltiazem  60 mg Oral Q6H  . [COMPLETED] furosemide      . [COMPLETED] furosemide  20 mg Intravenous Once  . furosemide  20 mg  Oral Daily  . levofloxacin  750 mg Intravenous Q24H  . loratadine  10 mg Oral Daily  . losartan  25 mg Oral Daily  . multivitamin with minerals  1 tablet Oral Daily  . omega-3 acid ethyl esters  1 g Oral Daily  . pantoprazole  40 mg Oral Daily  . sodium chloride  3 mL Intravenous Q12H  . vitamin B-12  100 mcg Oral Daily     Assessment: 79 yo male admitted with A/C systolic HF.  On chronic Coumadin PTA, although wife notes this was just started 3/27 at last hospital admission.  Considering switch to NOAC as outpatient.  Home Coumadin dose 2.5 mg daily.  Wife says this dose was reduced due to also being on prednisone.  INR last night above lower goal of 2-2.5.    Goal of Therapy:  *INR 2-2.5* Monitor platelets by anticoagulation protocol: Yes   Plan:  1. Recheck INR now. 2. Will re-dose Coumadin once INR < 2.5. 3. Daily PT/INR.  Kharon Hixon C 11/13/2012,10:32 AM  Addendum: INR remains above therapeutic goal of 2-2.5 F/U AM INR and  redose Coumadin when INR < 2.5.  Tad Moore, BCPS  Clinical Pharmacist Pager [phone]  11/13/2012 12:31 PM

## 2012-11-13 NOTE — Progress Notes (Addendum)
Patient has therapeutic INR today.  No additional VTE prophylaxis needed for now.  Lab Results  Component Value Date   INR 3.01* 11/12/2012   INR 2.5 11/08/2012   INR 2.2 11/01/2012    Tad Moore, BCPS  Clinical Pharmacist Pager [phone]  11/13/2012 10:23 AM

## 2012-11-13 NOTE — Progress Notes (Signed)
[facility] Progress Note [place] TEAM 1 - Stepdown/ICU TEAM   Joe Jackson ZOX:[id number] DOB: [birth year] DOA: 11/12/2012 PCP: Loreen Freud, DO  HPI: Joe Jackson is a 79 y.o. male  79 year old former smoker with pulmonary fibrosis, he is currently not on home oxygen, mild obstructive sleep apnea, pulmonary Fibrosis with PFTs- 1/09 FVC 60%, ratio 81 - no airway obstructoin!, But started having cough and dyspnea about one week prior to admission when she has primary care doctor who started him on a Z-Pak and steroids remained stable until one day prior to admission he started getting significantly short of breath at rest with a persistent cough. He reports chills at home but he did not check his temperature. He relates he has no PND, orthopnea, dizziness. He relates his cough has progressively gotten worse overnight and so has his shortness of breath. He relates before he could walk more than 100 feet without getting short of breath now he can not even walk to the bathroom without getting short of breath the [facility] a chest x-ray was done that shows pulmonary fibrosis and interstitial marking, a BMP was done that is 700, his saturations been above 90% on 2 L. Temperature was checked that showed 100.2, and his CBC his white count is 11 with a left shift and toxic granulations.  Denies any unusual weight loss   Assessment/Plan: Principal Problem:   Acute and chronic respiratory failure -Current respiratory symptoms exacerbation seems to be secondary to congestive heart failure versus pneumonia  Active Problems:  Community-acquired pneumonia -The suspicion is confirmed by a fever cough and a chest x-ray that reveals a focal infiltrate in the right lung -Continue Levaquin as symptoms are improving with this   Acute on chronic systolic heart failure -Appreciate cardiology input -Patient has improved significantly with one dose of IV Lasix - cardiology on  has started oral Lasix -Patient was previously on lisinopril/HCTZ-cardiology has placed him on losartan -Also starting low-dose beta blocker    Atrial fibrillation with RVR -Currently rate controlled on IV Cardizem -Will be transitioning to bisoprolol    HTN (hypertension) -Monitor closely with current medication change   COPD -Appears to be stable currently -Continue Symbicort and when necessary nebulizer treatments  Thrombocytopenia -Possibly related to infection    Code Status: Full Family Communication: With wife Disposition Plan: Continue to follow in step down unit  Consultants: Cardiology  Procedures: None  Antibiotics: Levaquin 4/11  DVT prophylaxis: Lovenox  HPI/Subjective: Patient states he's feeling much better than yesterday. Cough has improved. He is very fatigued.   Objective: Blood pressure 124/82, pulse 82, temperature 98.2 F (36.8 C), temperature source Oral, resp. rate 25, height 5\' 9"  (1.753 m), weight 90.8 kg (200 lb 2.8 oz), SpO2 95.00%.  Intake/Output Summary (Last 24 hours) at 11/13/12 1618 Last data filed at 11/13/12 1400  Gross per 24 hour  Intake 1329.42 ml  Output   2275 ml  Net -945.58 ml     Exam: General: No acute respiratory distress Lungs: Clear to auscultation bilaterally without wheezes or crackles Cardiovascular: Irregularly irregular rate and rhythm without murmur gallop or rub normal S1 and S2 Abdomen: Nontender, nondistended, soft, bowel sounds positive, no rebound, no ascites, no appreciable mass Extremities: No significant cyanosis, clubbing-mild edema bilateral lower extremities  Data Reviewed: Basic Metabolic Panel:  Recent Labs Lab 11/12/12 1652 11/13/12 0500  NA 137 139  K 4.9 4.1  CL 105 104  CO2 23 30  GLUCOSE 151* 107*  BUN 40* 39*  CREATININE 1.06 1.11  CALCIUM 8.5 8.1*   Liver Function Tests:  Recent Labs Lab 11/13/12 0500  AST 90*  ALT 183*  ALKPHOS 91  BILITOT 0.8  PROT 5.3*   ALBUMIN 2.3*   No results found for this basename: LIPASE, AMYLASE,  in the last 168 hours No results found for this basename: AMMONIA,  in the last 168 hours CBC:  Recent Labs Lab 11/12/12 1652 11/13/12 0500  WBC 13.0* 11.2*  HGB 12.4* 11.4*  HCT 37.6* 35.1*  MCV 83.4 83.8  PLT 93* 72*   Cardiac Enzymes:  Recent Labs Lab 11/12/12 1652 11/12/12 2104 11/13/12 0208 11/13/12 1004  TROPONINI <0.30 <0.30 <0.30 <0.30   BNP (last 3 results)  Recent Labs  10/23/12 0315 11/12/12 1652  PROBNP 728.0* 3423.0*   CBG: No results found for this basename: GLUCAP,  in the last 168 hours  No results found for this or any previous visit (from the past 240 hour(s)).   Studies:  Recent x-ray studies have been reviewed in detail by the Attending Physician  Scheduled Meds:  Scheduled Meds: . aspirin EC  81 mg Oral Daily  . atorvastatin  20 mg Oral QHS  . bisoprolol  2.5 mg Oral Daily  . budesonide-formoterol  2 puff Inhalation BID  . diltiazem  60 mg Oral Q6H  . furosemide  20 mg Oral Daily  . levofloxacin  750 mg Intravenous Q24H  . loratadine  10 mg Oral Daily  . losartan  25 mg Oral Daily  . multivitamin with minerals  1 tablet Oral Daily  . omega-3 acid ethyl esters  1 g Oral Daily  . pantoprazole  40 mg Oral Daily  . sodium chloride  3 mL Intravenous Q12H  . vitamin B-12  100 mcg Oral Daily   Continuous Infusions: . sodium chloride Stopped (11/12/12 2042)  . sodium chloride Stopped (11/13/12 1400)    Time spent on care of this patient: 35 minute   [facility]  [facility] Office  [phone] Pager - Text Page per Loretha Stapler as per below:  On-Call/Text Page:      [email or web address]      password [email or web address]  If 7PM-7AM, please contact night-coverage [email or web address] Password [email or web address] 11/13/2012, 4:18 PM   LOS: 1 day

## 2012-11-14 DIAGNOSIS — J9620 Acute and chronic respiratory failure, unspecified whether with hypoxia or hypercapnia: Secondary | ICD-10-CM

## 2012-11-14 DIAGNOSIS — K922 Gastrointestinal hemorrhage, unspecified: Secondary | ICD-10-CM | POA: Diagnosis not present

## 2012-11-14 LAB — BASIC METABOLIC PANEL
Chloride: 103 mEq/L (ref 96–112)
GFR calc Af Amer: 67 mL/min — ABNORMAL LOW (ref >90)
Potassium: 4 mEq/L (ref 3.5–5.1)

## 2012-11-14 LAB — CBC
HCT: 35.3 % — ABNORMAL LOW (ref 39.0–52.0)
Platelets: 78 10*3/uL — ABNORMAL LOW (ref 150–400)
RDW: 16.9 % — ABNORMAL HIGH (ref 11.5–15.5)
WBC: 10.1 10*3/uL (ref 4.0–10.5)

## 2012-11-14 LAB — PROTIME-INR: INR: 2.47 — ABNORMAL HIGH (ref 0.00–1.49)

## 2012-11-14 MED ORDER — LEVOFLOXACIN 750 MG PO TABS
750.0000 mg | ORAL_TABLET | ORAL | Status: DC
  Administered 2012-11-14 – 2012-11-17 (×4): 750 mg via ORAL
  Filled 2012-11-14 (×5): qty 1

## 2012-11-14 MED ORDER — VITAMIN K1 10 MG/ML IJ SOLN
5.0000 mg | Freq: Once | INTRAVENOUS | Status: AC
  Administered 2012-11-14: 5 mg via INTRAVENOUS
  Filled 2012-11-14: qty 0.5

## 2012-11-14 MED ORDER — POLYETHYLENE GLYCOL 3350 17 G PO PACK
17.0000 g | PACK | Freq: Every day | ORAL | Status: DC | PRN
  Filled 2012-11-14: qty 1

## 2012-11-14 MED ORDER — WARFARIN SODIUM 2.5 MG PO TABS
2.5000 mg | ORAL_TABLET | Freq: Once | ORAL | Status: DC
  Filled 2012-11-14: qty 1

## 2012-11-14 MED ORDER — MAGIC MOUTHWASH
5.0000 mL | ORAL | Status: DC | PRN
  Administered 2012-11-14: 5 mL via ORAL
  Filled 2012-11-14: qty 5

## 2012-11-14 MED ORDER — WARFARIN - PHARMACIST DOSING INPATIENT: Freq: Every day | Status: DC

## 2012-11-14 MED ORDER — BISOPROLOL FUMARATE 5 MG PO TABS
5.0000 mg | ORAL_TABLET | Freq: Every day | ORAL | Status: DC
  Administered 2012-11-15 – 2012-11-18 (×4): 5 mg via ORAL
  Filled 2012-11-14 (×4): qty 1

## 2012-11-14 NOTE — Progress Notes (Addendum)
[facility] Progress Note [place] TEAM 1 - Stepdown/ICU TEAM   Joe Jackson WUJ:[id number] DOB: [birth year] DOA: 11/12/2012 PCP: Loreen Freud, DO  HPI: Joe Jackson is a 79 y.o. male  79 year old former smoker with pulmonary fibrosis, he is currently not on home oxygen, mild obstructive sleep apnea, pulmonary Fibrosis with PFTs- 1/09 FVC 60%, ratio 81 - no airway obstructoin!, But started having cough and dyspnea about one week prior to admission when she has primary care doctor who started him on a Z-Pak and steroids remained stable until one day prior to admission he started getting significantly short of breath at rest with a persistent cough. He reports chills at home but he did not check his temperature. He relates he has no PND, orthopnea, dizziness. He relates his cough has progressively gotten worse overnight and so has his shortness of breath. He relates before he could walk more than 100 feet without getting short of breath now he can not even walk to the bathroom without getting short of breath the [facility] a chest x-ray was done that shows pulmonary fibrosis and interstitial marking, a BMP was done that is 700, his saturations been above 90% on 2 L. Temperature was checked that showed 100.2, and his CBC his white count is 11 with a left shift and toxic granulations.  Denies any unusual weight loss   Assessment/Plan: Principal Problem:   Acute and chronic respiratory failure -Current respiratory symptoms exacerbation seems to be secondary to congestive heart failure versus pneumonia  Active Problems:  Community-acquired pneumonia -The suspicion is confirmed by a fever cough and a chest x-ray that reveals a focal infiltrate in the right lung -Continue Levaquin as symptoms are improving with this   Acute on chronic systolic heart failure -Appreciate cardiology input -Patient has improved significantly with one dose of IV Lasix - cardiology on  has started oral Lasix -Patient was previously on lisinopril/HCTZ-cardiology has placed him on losartan -Also starting low-dose beta blocker    Atrial fibrillation with RVR -Currently rate controlled on IV Cardizem -Will be transitioning to bisoprolol  GI bleed - Gi states that no plans to scope during this admission - cont to follow - pt on Coumadin and thrombocytopenic - will hold coumadin and give Vit K now    HTN (hypertension) -Monitor closely with current medication change   COPD -Appears to be stable currently -Continue Symbicort and when necessary nebulizer treatments  Thrombocytopenia -Possibly related to infection    Code Status: Full Family Communication: With wife Disposition Plan: Continue to follow in step down unit due to bleeding  Consultants: Cardiology  Procedures: None  Antibiotics: Levaquin 4/11  DVT prophylaxis: Lovenox  HPI/Subjective: Patient states that he had a bloody BM today. Wife notes that he had one bloody BM last week as well which resolved on its own.    Objective: Blood pressure 127/57, pulse 82, temperature 97.8 F (36.6 C), temperature source Oral, resp. rate 21, height 5\' 9"  (1.753 m), weight 90 kg (198 lb 6.6 oz), SpO2 93.00%.  Intake/Output Summary (Last 24 hours) at 11/14/12 1533 Last data filed at 11/14/12 1400  Gross per 24 hour  Intake    750 ml  Output   1125 ml  Net   -375 ml     Exam: General: No acute respiratory distress Lungs: Crackles in RLL Cardiovascular: Irregularly irregular rate and rhythm without murmur gallop or rub normal S1 and S2 Abdomen: Nontender, nondistended, soft, bowel  sounds positive, no rebound, no ascites, no appreciable mass Extremities: No significant cyanosis, clubbing-mild edema bilateral lower extremities  Data Reviewed: Basic Metabolic Panel:  Recent Labs Lab 11/12/12 1652 11/13/12 0500 11/14/12 0813  NA 137 139 139  K 4.9 4.1 4.0  CL 105 104 103  CO2 23 30 30    GLUCOSE 151* 107* 115*  BUN 40* 39* 37*  CREATININE 1.06 1.11 1.15  CALCIUM 8.5 8.1* 8.2*   Liver Function Tests:  Recent Labs Lab 11/13/12 0500  AST 90*  ALT 183*  ALKPHOS 91  BILITOT 0.8  PROT 5.3*  ALBUMIN 2.3*   No results found for this basename: LIPASE, AMYLASE,  in the last 168 hours No results found for this basename: AMMONIA,  in the last 168 hours CBC:  Recent Labs Lab 11/12/12 1652 11/13/12 0500 11/14/12 0813  WBC 13.0* 11.2* 10.1  HGB 12.4* 11.4* 11.5*  HCT 37.6* 35.1* 35.3*  MCV 83.4 83.8 85.1  PLT 93* 72* 78*   Cardiac Enzymes:  Recent Labs Lab 11/12/12 1652 11/12/12 2104 11/13/12 0208 11/13/12 1004  TROPONINI <0.30 <0.30 <0.30 <0.30   BNP (last 3 results)  Recent Labs  10/23/12 0315 11/12/12 1652  PROBNP 728.0* 3423.0*   CBG: No results found for this basename: GLUCAP,  in the last 168 hours  No results found for this or any previous visit (from the past 240 hour(s)).   Studies:  Recent x-ray studies have been reviewed in detail by the Attending Physician  Scheduled Meds:  Scheduled Meds: . aspirin EC  81 mg Oral Daily  . atorvastatin  20 mg Oral QHS  . [START ON 11/15/2012] bisoprolol  5 mg Oral Daily  . budesonide-formoterol  2 puff Inhalation BID  . furosemide  20 mg Oral Daily  . levofloxacin  750 mg Oral Q24H  . loratadine  10 mg Oral Daily  . losartan  25 mg Oral Daily  . multivitamin with minerals  1 tablet Oral Daily  . omega-3 acid ethyl esters  1 g Oral Daily  . pantoprazole  40 mg Oral Daily  . sodium chloride  3 mL Intravenous Q12H  . vitamin B-12  100 mcg Oral Daily  . warfarin  2.5 mg Oral ONCE-1800  . Warfarin - Pharmacist Dosing Inpatient   Does not apply q1800   Continuous Infusions: . sodium chloride Stopped (11/12/12 2042)  . sodium chloride Stopped (11/13/12 1400)    Time spent on care of this patient: 35 minute   [facility]  [facility] Office  [phone] Pager - Text Page per  Loretha Stapler as per below:  On-Call/Text Page:      [email or web address]      password [email or web address]  If 7PM-7AM, please contact night-coverage [email or web address] Password [facility] 11/14/2012, 3:33 PM   LOS: 2 days

## 2012-11-14 NOTE — Consult Note (Signed)
Consult for Joe Jackson  Reason for Consult: Hematochezia Referring Physician: [facility]  [name] HPI: This is an 79 year old male admitted for an acute exacerbation of his pulmonary fibrosis/COPD and on chronic coumadin for his new diagnosis of PAF.  Last Wednesday he had a bout of diarrhea and there was associated hematochezia.  The bleeding stopped after one episode, but here in the hospital the patient and his wife reports some hematochezia on the bed pad.  He had a bowel movement yesterday and there was some blood in the stool.  He reports having a colonoscopy in the past by one of the Joe Jackson physicians and it was negative per his report, but he cannot recall the indication for the procedure.    Past Medical History  Diagnosis Date  . Hypertension   . Hyperlipidemia   . Chronic combined systolic and diastolic CHF (congestive heart failure)     a. 10/2012 Echo: EF 40-45%, Gr1 dd, postlat AK, mild to mod AS, Triv AI, Mild MR, mildly dil LA.  . Sleep apnea   . Aortic aneurysm, abdominal   . Pulmonary fibrosis   . Ischemic cardiomyopathy     a. EF 40-45% by echo 10/2012  . CAD (coronary artery disease)     a. s/p prior MI;  b. 11/1999 Cath: LM nl, LAD 74m, LCX min irregs, OM 100, RCA 80p, 100d, L->L and L->R collats, EF 54%.  Marland Kitchen COPD (chronic obstructive pulmonary disease)   . PAF (paroxysmal atrial fibrillation)     a. Dx 10/2012 during hosp for resp failure/copd flare    Past Surgical History  Procedure Laterality Date  . Neck surgery    . Arm surgery    . Back surgery    . Leg surgery      Family History  Problem Relation Age of Onset  . Heart disease Mother     Heart Disease before age  84  . Other Mother     Rheumatism  . Heart attack Mother   . Heart attack Father     Social History:  reports that he quit smoking about 15 months ago. His smoking use included Cigarettes. He has a 62.4 pack-year smoking history. His smokeless tobacco use includes Chew. He  reports that he does not drink alcohol or use illicit drugs.  Allergies:  Allergies  Allergen Reactions  . Ceftriaxone Sodium In Dextrose Other (See Comments)    redness and lip swelling    Medications:  Scheduled: . aspirin EC  81 mg Oral Daily  . atorvastatin  20 mg Oral QHS  . [START ON 11/15/2012] bisoprolol  5 mg Oral Daily  . budesonide-formoterol  2 puff Inhalation BID  . furosemide  20 mg Oral Daily  . levofloxacin  750 mg Oral Q24H  . loratadine  10 mg Oral Daily  . losartan  25 mg Oral Daily  . multivitamin with minerals  1 tablet Oral Daily  . omega-3 acid ethyl esters  1 g Oral Daily  . pantoprazole  40 mg Oral Daily  . sodium chloride  3 mL Intravenous Q12H  . vitamin B-12  100 mcg Oral Daily  . warfarin  2.5 mg Oral ONCE-1800  . Warfarin - Pharmacist Dosing Inpatient   Does not apply q1800   Continuous: . sodium chloride Stopped (11/12/12 2042)  . sodium chloride Stopped (11/13/12 1400)    Results for orders placed during the hospital encounter of 11/12/12 (from the past 24 hour(s))  PROTIME-INR     Status: Abnormal   Collection Time    11/14/12  4:50 AM      Result Value Range   Prothrombin Time 25.6 (*) 11.6 - 15.2 seconds   INR 2.47 (*) 0.00 - 1.49  CBC     Status: Abnormal   Collection Time    11/14/12  8:13 AM      Result Value Range   WBC 10.1  4.0 - 10.5 K/uL   RBC 4.15 (*) 4.22 - 5.81 MIL/uL   Hemoglobin 11.5 (*) 13.0 - 17.0 g/dL   HCT 16.1 (*) 09.6 - 04.5 %   MCV 85.1  78.0 - 100.0 fL   MCH 27.7  26.0 - 34.0 pg   MCHC 32.6  30.0 - 36.0 g/dL   RDW 40.9 (*) 81.1 - 91.4 %   Platelets 78 (*) 150 - 400 K/uL  BASIC METABOLIC PANEL     Status: Abnormal   Collection Time    11/14/12  8:13 AM      Result Value Range   Sodium 139  135 - 145 mEq/L   Potassium 4.0  3.5 - 5.1 mEq/L   Chloride 103  96 - 112 mEq/L   CO2 30  19 - 32 mEq/L   Glucose, Bld 115 (*) 70 - 99 mg/dL   BUN 37 (*) 6 - 23 mg/dL   Creatinine, Ser 7.82  0.50 - 1.35 mg/dL    Calcium 8.2 (*) 8.4 - 10.5 mg/dL   GFR calc non Af Amer 58 (*) >90 mL/min   GFR calc Af Amer 67 (*) >90 mL/min     Dg Chest 2 View  11/12/2012  *RADIOLOGY REPORT*  Clinical Data: Shortness of breath, weakness  CHEST - 2 VIEW  Comparison: 10/23/2012  Findings: Chronic interstitial markings.  Superimposed patchy/pleural based opacity in the lateral right lung, new, likely infectious/inflammatory.  Possible small right pleural effusion.  Cardiomegaly.  Mild degenerative changes of the visualized thoracolumbar spine.  IMPRESSION: Patchy/pleural based opacity in the lateral right lung, new, likely infectious/inflammatory.  Follow-up radiographs are suggested to document clearance.  Possible small right pleural effusion.  Underlying chronic interstitial markings.   Original Report Authenticated By: Charline Bills, M.D.     ROS:  As stated above in the HPI otherwise negative.  Blood pressure 137/66, pulse 82, temperature 97.5 F (36.4 C), temperature source Oral, resp. rate 22, height 5\' 9"  (1.753 m), weight 198 lb 6.6 oz (90 kg), SpO2 72.00%.    PE: Gen: NAD, Alert and Oriented HEENT:  [place]/AT, EOMI Neck: Supple, no LAD Lungs: CTA Bilaterally CV: Irreg, irreg without M/G/R ABM: Soft, NTND, +BS Ext: No C/C/E Rectal: Light tinge of blood  Assessment/Plan: 1) Hematochezia.   The description of the recent bleeding and the findings of the rectal examination may point to a hemorrhoidal source, but a diverticular bleed is certainly a possibility.  I do not have his prior colonoscopy report, but he is hemodynamically stable at this time.  I do not feel there is any need to perform any invasive procedures at this time.  Follow up as an out patient will be appropriate.  Plan: 1) Follow HGB. 2) Transfuse if necessary. 3) Follow up with [place] [facility] as an outpatient.  [name] D 11/14/2012, 12:34 PM

## 2012-11-14 NOTE — Progress Notes (Addendum)
ANTICOAGULATION CONSULT NOTE - Initial Consult  Pharmacy Consult for Warfarin Indication: atrial fibrillation  Allergies  Allergen Reactions  . Ceftriaxone Sodium In Dextrose Other (See Comments)    redness and lip swelling    Patient Measurements: Height: 5\' 9"  (175.3 cm) Weight: 198 lb 6.6 oz (90 kg) IBW/kg (Calculated) : 70.7 Heparin Dosing Weight: n/a  Vital Signs: Temp: 97.5 F (36.4 C) (04/13 0800) Temp src: Oral (04/13 0800) BP: 137/66 mmHg (04/13 1000)  Labs:  Recent Labs  11/12/12 1652 11/12/12 2104 11/13/12 0208 11/13/12 0500 11/13/12 1004 11/13/12 1049 11/14/12 0450 11/14/12 0813  HGB 12.4*  --   --  11.4*  --   --   --  11.5*  HCT 37.6*  --   --  35.1*  --   --   --  35.3*  PLT 93*  --   --  72*  --   --   --  78*  LABPROT 29.6*  --   --   --   --  29.7* 25.6*  --   INR 3.01*  --   --   --   --  3.02* 2.47*  --   CREATININE 1.06  --   --  1.11  --   --   --  1.15  TROPONINI <0.30 <0.30 <0.30  --  <0.30  --   --   --     Estimated Creatinine Clearance: 55.9 ml/min (by C-G formula based on Cr of 1.15).   Medical History: Past Medical History  Diagnosis Date  . Hypertension   . Hyperlipidemia   . Chronic combined systolic and diastolic CHF (congestive heart failure)     a. 10/2012 Echo: EF 40-45%, Gr1 dd, postlat AK, mild to mod AS, Triv AI, Mild MR, mildly dil LA.  . Sleep apnea   . Aortic aneurysm, abdominal   . Pulmonary fibrosis   . Ischemic cardiomyopathy     a. EF 40-45% by echo 10/2012  . CAD (coronary artery disease)     a. s/p prior MI;  b. 11/1999 Cath: LM nl, LAD 72m, LCX min irregs, OM 100, RCA 80p, 100d, L->L and L->R collats, EF 54%.  Marland Kitchen COPD (chronic obstructive pulmonary disease)   . PAF (paroxysmal atrial fibrillation)     a. Dx 10/2012 during hosp for resp failure/copd flare    Medications:  Scheduled:  . aspirin EC  81 mg Oral Daily  . atorvastatin  20 mg Oral QHS  . [START ON 11/15/2012] bisoprolol  5 mg Oral Daily  .  budesonide-formoterol  2 puff Inhalation BID  . furosemide  20 mg Oral Daily  . levofloxacin  750 mg Intravenous Q24H  . loratadine  10 mg Oral Daily  . losartan  25 mg Oral Daily  . multivitamin with minerals  1 tablet Oral Daily  . omega-3 acid ethyl esters  1 g Oral Daily  . pantoprazole  40 mg Oral Daily  . sodium chloride  3 mL Intravenous Q12H  . vitamin B-12  100 mcg Oral Daily  . [DISCONTINUED] bisoprolol  2.5 mg Oral Daily  . [DISCONTINUED] diltiazem  60 mg Oral Q6H  . [DISCONTINUED] enoxaparin (LOVENOX) injection  40 mg Subcutaneous Q24H     Assessment: 79 yo male admitted with A/C systolic HF.  On chronic Coumadin PTA, although wife notes this was just started 3/27 at last hospital admission.  Considering switch to NOAC as outpatient.  INR back in goal range today (2.47).  Last  dose PTA on 4/10.  Home Coumadin dose 2.5 mg daily.  Wife says this dose was reduced due to also being on prednisone.  INR 3.01 on admission.  INR last night above lower goal of 2-2.5.    Goal of Therapy:  *INR 2-2.5* Monitor platelets by anticoagulation protocol: Yes   Plan:  1. Coumadin 2.5 mg x 1 tonight.  Now off prednisone, but may need less Coumadin as an outpatient since INR goal has been reduced to 2-2.5. 2. AM INR.  Tad Moore, BCPS  Clinical Pharmacist Pager [phone]  11/14/2012 12:25 PM   Addendum - will also change IV levaquin to PO.  This patient is receiving levaquin by the intravenous route. Based on criteria approved by the Pharmacy and Therapeutics Committee, and the Infectious Disease Division, the antibiotic(s) is / are being converted to equivalent oral dose form(s). These criteria include: . Patient being treated for a respiratory tract infection, urinary tract infection, cellulitis, or Clostridium Difficile Associated Diarrhea . The patient is not neutropenic and does not exhibit a GI malabsorption state . The patient is eating (either orally or per  tube) and/or has been taking other orally administered medications for at least 24 hours. . The patient is improving clinically (physician assessment and a 24-hour Tmax of ? 100.5? F).  Thanks!  Tad Moore, BCPS  Clinical Pharmacist Pager [phone]  11/14/2012 12:25 PM

## 2012-11-14 NOTE — Progress Notes (Signed)
Subjective:   79 y.o. male w/ PMHx significant for CAD s/p MI 2001, PVD, CHF with EF of 40-45%, COPD and recent diagnosis of paroxysmal afib who was admitted on 11/12/2012 with weakness and diarrhea. On admit had AF with RVR (started on dilt) and volume overload.   Yesterday got one dose IV lasix and started on po. Losartan and bisprolol also started. IV diltiazem changed to po.  Weight down 7 pounds total. Cr stable. INR 2.5   Intake/Output Summary (Last 24 hours) at 11/14/12 1139 Last data filed at 11/14/12 0900  Gross per 24 hour  Intake   1055 ml  Output   1425 ml  Net   -370 ml    Current meds: . aspirin EC  81 mg Oral Daily  . atorvastatin  20 mg Oral QHS  . bisoprolol  2.5 mg Oral Daily  . budesonide-formoterol  2 puff Inhalation BID  . diltiazem  60 mg Oral Q6H  . furosemide  20 mg Oral Daily  . levofloxacin  750 mg Intravenous Q24H  . loratadine  10 mg Oral Daily  . losartan  25 mg Oral Daily  . multivitamin with minerals  1 tablet Oral Daily  . omega-3 acid ethyl esters  1 g Oral Daily  . pantoprazole  40 mg Oral Daily  . sodium chloride  3 mL Intravenous Q12H  . vitamin B-12  100 mcg Oral Daily   Infusions: . sodium chloride Stopped (11/12/12 2042)  . sodium chloride Stopped (11/13/12 1400)     Objective:  Blood pressure 118/69, pulse 82, temperature 97.5 F (36.4 C), temperature source Oral, resp. rate 19, height 5\' 9"  (1.753 m), weight 90 kg (198 lb 6.6 oz), SpO2 72.00%. Weight change: -3.3 kg (-7 lb 4.4 oz)  Physical Exam: General: Well developed, well nourished, in no acute distress. Sitting in chair Head: Normocephalic, atraumatic, sclera non-icteric, no xanthomas, nares are without discharge.  Neck: Supple. Negative for carotid bruits. JVP difficult to appreciate  Lungs: Decreased BS throughout. No wheezing.  Heart: irreg, irreg, 2/6 SEM RSB Abdomen: protuberant, nontender, with normoactive bowel sounds. No hepatomegaly. No rebound/guarding. No  obvious abdominal masses.  Msk: Strength and tone appear normal for age.  Extremities: No clubbing or cyanosis. Warm and dry. Severe varicose veins 1-2+ edema L and 1+ R Neuro: Alert and oriented X 3. Moves all extremities spontaneously.  Psych: Responds to questions appropriately with a normal affect.    Telemetry:  AF 60   Lab Results: Basic Metabolic Panel:  Recent Labs Lab 11/12/12 1652 11/13/12 0500 11/14/12 0813  NA 137 139 139  K 4.9 4.1 4.0  CL 105 104 103  CO2 23 30 30   GLUCOSE 151* 107* 115*  BUN 40* 39* 37*  CREATININE 1.06 1.11 1.15  CALCIUM 8.5 8.1* 8.2*   Liver Function Tests:  Recent Labs Lab 11/13/12 0500  AST 90*  ALT 183*  ALKPHOS 91  BILITOT 0.8  PROT 5.3*  ALBUMIN 2.3*   No results found for this basename: LIPASE, AMYLASE,  in the last 168 hours No results found for this basename: AMMONIA,  in the last 168 hours CBC:  Recent Labs Lab 11/12/12 1652 11/13/12 0500 11/14/12 0813  WBC 13.0* 11.2* 10.1  HGB 12.4* 11.4* 11.5*  HCT 37.6* 35.1* 35.3*  MCV 83.4 83.8 85.1  PLT 93* 72* 78*   Cardiac Enzymes:  Recent Labs Lab 11/12/12 1652 11/12/12 2104 11/13/12 0208 11/13/12 1004  TROPONINI <0.30 <0.30 <0.30 <0.30  BNP: No components found with this basename: POCBNP,  CBG: No results found for this basename: GLUCAP,  in the last 168 hours Microbiology: Lab Results  Component Value Date   CULT NORMAL OROPHARYNGEAL FLORA 10/23/2012   CULT NO GROWTH 5 DAYS 10/23/2012   CULT NO GROWTH 5 DAYS 10/23/2012   CULT NO GROWTH 5 DAYS 10/23/2012   CULT NO GROWTH 5 DAYS 10/23/2012   No results found for this basename: CULT, SDES,  in the last 168 hours  Imaging: Dg Chest 2 View  11/12/2012  *RADIOLOGY REPORT*  Clinical Data: Shortness of breath, weakness  CHEST - 2 VIEW  Comparison: 10/23/2012  Findings: Chronic interstitial markings.  Superimposed patchy/pleural based opacity in the lateral right lung, new, likely infectious/inflammatory.   Possible small right pleural effusion.  Cardiomegaly.  Mild degenerative changes of the visualized thoracolumbar spine.  IMPRESSION: Patchy/pleural based opacity in the lateral right lung, new, likely infectious/inflammatory.  Follow-up radiographs are suggested to document clearance.  Possible small right pleural effusion.  Underlying chronic interstitial markings.   Original Report Authenticated By: Charline Bills, M.D.      ASSESSMENT:   1. Paroxysmal Afib with RVR  2. A/C systolic CHF,EF of 40-45% by recent echo, 3. Diarrheal illness  4. COPD 5. Aortic stenosis, mild to mod by echo    PLAN/DISCUSSION:  Volume status much improved but still with mild edema. Continue po lasix. Place TED hose.   AF is rate controlled on IV dilt and low-dose bisoprolol. Given LV dysfunction, I would prefer to wean diltiazem if possible in favor of bisoprolol as tolerated by COPD.   (FEV1 in 2013 was 1.95L (68%).  He seems to tolerate AF ok but Certainly has made volume management more difficult. I suspect he may need anti-arrhythmic drug down the road. Will defer to Dr. Riley Kill. Continue coumadin. Can consider switch to NOAC as outpatient.   Has been on lisinopril-HCTZ for a long time but stopped at last discharge. Given need for lasix and COPD, I made choice to use losartan instead (ARB vs ACE).  Ok to go to tele. Can probably be d/c'd in am if ok with [facility].   LOS: 2 days  Arvilla Meres, MD 11/14/2012, 11:39 AM

## 2012-11-15 ENCOUNTER — Inpatient Hospital Stay (HOSPITAL_COMMUNITY): Payer: Medicare PPO

## 2012-11-15 DIAGNOSIS — R7989 Other specified abnormal findings of blood chemistry: Secondary | ICD-10-CM

## 2012-11-15 DIAGNOSIS — D696 Thrombocytopenia, unspecified: Secondary | ICD-10-CM

## 2012-11-15 DIAGNOSIS — Z7901 Long term (current) use of anticoagulants: Secondary | ICD-10-CM

## 2012-11-15 LAB — CBC
HCT: 36.8 % — ABNORMAL LOW (ref 39.0–52.0)
Hemoglobin: 12.1 g/dL — ABNORMAL LOW (ref 13.0–17.0)
MCV: 84.6 fL (ref 78.0–100.0)
RBC: 4.35 MIL/uL (ref 4.22–5.81)
WBC: 9 10*3/uL (ref 4.0–10.5)

## 2012-11-15 LAB — BASIC METABOLIC PANEL
BUN: 31 mg/dL — ABNORMAL HIGH (ref 6–23)
CO2: 30 mEq/L (ref 19–32)
Chloride: 103 mEq/L (ref 96–112)
Creatinine, Ser: 1.05 mg/dL (ref 0.50–1.35)
Potassium: 3.9 mEq/L (ref 3.5–5.1)

## 2012-11-15 LAB — SAVE SMEAR

## 2012-11-15 MED ORDER — IOHEXOL 300 MG/ML  SOLN
100.0000 mL | Freq: Once | INTRAMUSCULAR | Status: AC | PRN
  Administered 2012-11-15: 100 mL via INTRAVENOUS

## 2012-11-15 MED ORDER — DILTIAZEM HCL 30 MG PO TABS
30.0000 mg | ORAL_TABLET | Freq: Four times a day (QID) | ORAL | Status: DC
  Administered 2012-11-15 – 2012-11-16 (×4): 30 mg via ORAL
  Filled 2012-11-15 (×8): qty 1

## 2012-11-15 MED ORDER — WARFARIN SODIUM 2.5 MG PO TABS
2.5000 mg | ORAL_TABLET | Freq: Once | ORAL | Status: AC
  Administered 2012-11-15: 2.5 mg via ORAL
  Filled 2012-11-15 (×2): qty 1

## 2012-11-15 MED ORDER — WARFARIN - PHARMACIST DOSING INPATIENT
Freq: Every day | Status: DC
  Administered 2012-11-17: 18:00:00

## 2012-11-15 MED ORDER — IOHEXOL 300 MG/ML  SOLN
25.0000 mL | INTRAMUSCULAR | Status: AC
  Administered 2012-11-15: 25 mL via ORAL

## 2012-11-15 NOTE — Care Management Note (Addendum)
    Page 1 of 1   11/17/2012     4:54:29 PM   CARE MANAGEMENT NOTE 11/17/2012  Patient:  Joe Jackson, Joe Jackson   Account Number:  [id number]  Date Initiated:  11/15/2012  Documentation initiated by:  Junius Creamer  Subjective/Objective Assessment:   adm w at fib     Action/Plan:   lives w wife, pcp dr [name] lowne   Anticipated DC Date:  11/18/2012   Anticipated DC Plan:  HOME W HOME HEALTH SERVICES      DC Planning Services  CM consult      Choice offered to / List presented to:             Status of service:  In process, will continue to follow Medicare Important Message given?   (If response is "NO", the following Medicare IM given date fields will be blank) Date Medicare IM given:   Date Additional Medicare IM given:    Discharge Disposition:    Per UR Regulation:  Reviewed for med. necessity/level of care/duration of stay  If discussed at Long Length of Stay Meetings, dates discussed:    Comments:  11/17/12 Sapna Padron,RN,BSN [phone] MET WITH PT AND WIFE TO DISCUSS DC PLANS.  WIFE STATES SHE WORKS PART TIME.  SUGGESTED HH CARE FOR POSS CHF FOLLOW UP, POSS P.T.  PATIENT UNAGREEABLE TO THIS, STATES HE HAS PLENTY OF HELP, AND DOES NOT NEED HH CARE.  4/14 0859 debbie dowell rn,bsn

## 2012-11-15 NOTE — Progress Notes (Signed)
Physical Therapy Treatment Patient Details Name: Joe Jackson MRN: [id number] DOB: [birth year] Today's Date: 11/15/2012 Time: 0240-0303 PT Time Calculation (min): 23 min  PT Assessment / Plan / Recommendation Comments on Treatment Session  Pt moving well.  Able to ambulate ~200' with Min Guard.  2/4 DOE noted with 02 sats decreasing to 89% RA for brief moment but quickly returned to >90% with standing rest break & pursed lip breathing.   Strongly encouraged pt to ambulate with Nsing 3x's/day.      Follow Up Recommendations  Home health PT;Supervision/Assistance - 24 hour;Other (comment)     Does the patient have the potential to tolerate intense rehabilitation     Barriers to Discharge        Equipment Recommendations  None recommended by PT    Recommendations for Other Services    Frequency Min 3X/week   Plan Discharge plan remains appropriate;Frequency remains appropriate    Precautions / Restrictions Precautions Precautions: Fall Restrictions Weight Bearing Restrictions: No       Mobility  Bed Mobility Bed Mobility: Not assessed Transfers Transfers: Sit to Stand;Stand to Sit Sit to Stand: 6: Modified independent (Device/Increase time);With upper extremity assist;With armrests;From chair/3-in-1 Stand to Sit: 6: Modified independent (Device/Increase time);With upper extremity assist;With armrests;To chair/3-in-1 Ambulation/Gait Ambulation/Gait Assistance: 4: Min guard Ambulation Distance (Feet): 200 Feet Assistive device: None Ambulation/Gait Assistance Details: Guarding for safety.  2/4 DOE.  Cues for pursed lip breathing &  self-monitoring of when needing to take rest break.   Gait Pattern: Within Functional Limits Gait velocity: At times, too fast.  Cues to slow down for safety Stairs: No Wheelchair Mobility Wheelchair Mobility: No      PT Goals Acute Rehab PT Goals Time For Goal Achievement: 11/20/12 Potential to Achieve Goals: Good Pt will go Supine/Side  to Sit: Independently;with HOB 0 degrees Pt will go Sit to Stand: with modified independence;with upper extremity assist PT Goal: Sit to Stand - Progress: Met Pt will Ambulate: 51 - 150 feet;with modified independence;with least restrictive assistive device PT Goal: Ambulate - Progress: Progressing toward goal  Visit Information  Last PT Received On: 11/15/12 Assistance Needed: +1    Subjective Data  Patient Stated Goal: To go home soon.   Cognition  Cognition Overall Cognitive Status: Appears within functional limits for tasks assessed/performed Arousal/Alertness: Awake/alert Orientation Level: Appears intact for tasks assessed Behavior During Session: [facility] for tasks performed    Balance  Balance Balance Assessed: Yes Dynamic Standing Balance Dynamic Standing - Balance Support: No upper extremity supported Dynamic Standing - Level of Assistance: 5: Stand by assistance Dynamic Standing - Balance Activities: Forward lean/weight shifting;Lateral lean/weight shifting;Reaching across midline Dynamic Standing - Comments: picking object off floor, 360 degree turns  End of Session PT - End of Session Activity Tolerance: Patient tolerated treatment well Patient left: in chair;with call bell/phone within reach;with family/visitor present (MD present) Nurse Communication: Mobility status     Verdell Face, [place] [phone] 11/15/2012

## 2012-11-15 NOTE — Progress Notes (Signed)
     Joe Jackson 11/15/2012, 9:51 AM  SUBJECTIVE:       Feels ok, no abdominal pain.  No stool or BPR.  In fact he wants to get some Miralax.  OBJECTIVE:         Vital signs in last 24 hours:    Temp:  [97.8 F (36.6 C)-98.4 F (36.9 C)] 98.4 F (36.9 C) (04/14 0400) Resp:  [20-25] 22 (04/14 0405) BP: (125-154)/(57-78) 154/63 mmHg (04/14 0405) SpO2:  [92 %-98 %] 98 % (04/14 0405) Weight:  [91.6 kg (201 lb 15.1 oz)] 91.6 kg (201 lb 15.1 oz) (04/14 0600) Last BM Date: 11/14/12 General: looks generally unwell but comfortable   Heart: RRR Chest: clear B Abdomen: soft, NT, ND.  No mass  Extremities: some minor pedal edema Neuro/Psych:  Pleasant, oriented x 3.    Intake/Output from previous day: 04/13 0701 - 04/14 0700 In: 1013 [P.O.:960; I.V.:3; IV Piggyback:50] Out: 1425 [Urine:1425]  Intake/Output this shift:    Lab Results:  Recent Labs  11/13/12 0500 11/14/12 0813 11/15/12 0610  WBC 11.2* 10.1 9.0  HGB 11.4* 11.5* 12.1*  HCT 35.1* 35.3* 36.8*  PLT 72* 78* 76*   BMET  Recent Labs  11/13/12 0500 11/14/12 0813 11/15/12 0610  NA 139 139 140  K 4.1 4.0 3.9  CL 104 103 103  CO2 30 30 30   GLUCOSE 107* 115* 102*  BUN 39* 37* 31*  CREATININE 1.11 1.15 1.05  CALCIUM 8.1* 8.2* 8.2*   LFT  Recent Labs  11/13/12 0500  PROT 5.3*  ALBUMIN 2.3*  AST 90*  ALT 183*  ALKPHOS 91  BILITOT 0.8   PT/INR  Recent Labs  11/14/12 0450 11/15/12 0610  LABPROT 25.6* 15.2  INR 2.47* 1.22    ASSESMENT: * Limited ematochezia in setting of Coumadin.  INR max of 3.02.  Not associated with any drop in Hgb.  Unable to find any EGD or colonoscopy reports dating to 2001 (earliest Epic documents) *  A fib, chronic coumadin. On hold currently. Got vitamin K 4/13.  *  Thrombocytopenia.  New problem noted this admission.  *  Elevated LFTs. Not known to have liver disease but no CT or ultrasound of liver in 5 years.  *  CHF.  EF 40 to 45%, grade 1  diastolic dysfunction, mild mitral valve and aortic valve stenosis/clacification with trivial regurge.  *  Acute renal failure, improved.  *  OSA, pulmonary fibrosis, COPD.    PLAN: *  Get ultrasound of liver to assess for occult liver disease.    LOS: 3 days   Joe Jackson  11/15/2012, 9:51 AM Pager: [phone]

## 2012-11-15 NOTE — Progress Notes (Signed)
[facility] Progress Note [place] TEAM 1 - Stepdown/ICU TEAM   Joe Jackson UEA:[id number] DOB: 1929/09/01 DOA: 11/12/2012 PCP: Loreen Freud, DO  HPI: 79 year old former smoker with pulmonary fibrosis, currently not on home oxygen, mild obstructive sleep apnea, who started having cough and dyspnea about one week prior to admission.  He failed outpt tx w/ Zpack and steroids.  He related before he could walk more than 100 feet without getting short of breath - now he can not even walk to the bathroom without getting short of breath/  [facility] a chest x-ray was done that shows pulmonary fibrosis and interstitial marking, a BNP was done that is 700, his saturations above 90% on 2 L. Temperature was checked that showed 100.2, and his CBC his white count is 11 with a left shift and toxic granulations.   Assessment/Plan:    Acute and chronic respiratory failure -Current respiratory symptoms exacerbation seems to be secondary to congestive heart failure from RVR and less likely pneumonia (see below)  ?Community-acquired pneumonia -Actually meets criteria for HCAP since last admit late March -He did have fever, cough and a chest x-ray that revealed a focal infiltrate in the right lung could have had an URI (bronchitis) that then precipitated AF/RVR -Continue Levaquin as symptoms are improving with this -check 2v CXR in am   Acute on chronic systolic heart failure -Appreciate cardiology input -Patient has improved significantly with one dose of IV Lasix - cardiology has started oral Lasix -Patient was previously on lisinopril/HCTZ-cardiology has placed him on losartan -Also starting low-dose beta blocker    Atrial fibrillation with RVR -Currently rate moderately controlled on short acting Cardizem and Bisoprolol -his progressive DOE could be due to RVR causing HF  Thrombocytopenia -Discussed with Dr. Myna Hidalgo today with mutual chart review -at this point no  inidcation to stop Coumadin since this does not affect platelets -He did recommend stopping ASA and since no prior CAD or stroke history which would warrant anti-platelet therapy I have stopped -he has also requested a peripheral smear so he can continue to informally review chart and contact us with additional recs -Recent lovenox so check HIT panel  GI bleed - Gi states that no plans to scope during this admission - cont to follow - pt on Coumadin and thrombocytopenic - Coumadin on hold and was given Vit K (4/13) -no further recurrences    HTN (hypertension) -Monitor closely with current medication change   COPD -Appears to be stable currently -Continue Symbicort and when necessary nebulizer treatments  Code Status: Full Family Communication: With patient Disposition Plan: Continue to follow in step down unit due to persistent tachycardia  Consultants: Cardiology GI  Procedures: None  Antibiotics:  Levaquin 4/11>>>  DVT prophylaxis: SCDs   HPI/Subjective: Denies further blood in stool. Wants to go home but aware other problems need clarification before dc and he needs to mobilize first  Objective: Blood pressure 128/60, pulse 82, temperature 98.6 F (37 C), temperature source Oral, resp. rate 21, height 5\' 9"  (1.753 m), weight 91.6 kg (201 lb 15.1 oz), SpO2 96.00%.  Intake/Output Summary (Last 24 hours) at 11/15/12 1336 Last data filed at 11/15/12 0700  Gross per 24 hour  Intake   1013 ml  Output   1225 ml  Net   -212 ml    Exam: General: No acute respiratory distress Lungs: Crackles in RLL Cardiovascular: Irregular rhythm with rates up to 120's even at rest, atrial fibrillation  rhythm; no murmur gallop or rub normal S1 and S2, trace peripheral,edema Abdomen: Nontender, nondistended, soft, bowel sounds positive, no rebound, no ascites, no appreciable mass Extremities: No significant cyanosis, clubbing-mild edema bilateral lower extremities  Data  Reviewed: Basic Metabolic Panel:  Recent Labs Lab 11/12/12 1652 11/13/12 0500 11/14/12 0813 11/15/12 0610  NA 137 139 139 140  K 4.9 4.1 4.0 3.9  CL 105 104 103 103  CO2 23 30 30 30   GLUCOSE 151* 107* 115* 102*  BUN 40* 39* 37* 31*  CREATININE 1.06 1.11 1.15 1.05  CALCIUM 8.5 8.1* 8.2* 8.2*   Liver Function Tests:  Recent Labs Lab 11/13/12 0500  AST 90*  ALT 183*  ALKPHOS 91  BILITOT 0.8  PROT 5.3*  ALBUMIN 2.3*   CBC:  Recent Labs Lab 11/12/12 1652 11/13/12 0500 11/14/12 0813 11/15/12 0610  WBC 13.0* 11.2* 10.1 9.0  HGB 12.4* 11.4* 11.5* 12.1*  HCT 37.6* 35.1* 35.3* 36.8*  MCV 83.4 83.8 85.1 84.6  PLT 93* 72* 78* 76*   Cardiac Enzymes:  Recent Labs Lab 11/12/12 1652 11/12/12 2104 11/13/12 0208 11/13/12 1004  TROPONINI <0.30 <0.30 <0.30 <0.30   BNP (last 3 results)  Recent Labs  10/23/12 0315 11/12/12 1652  PROBNP 728.0* 3423.0*    Studies:  Recent x-ray studies have been reviewed in detail by the Attending Physician  Scheduled Meds:  Scheduled Meds: . atorvastatin  20 mg Oral QHS  . bisoprolol  5 mg Oral Daily  . budesonide-formoterol  2 puff Inhalation BID  . diltiazem  30 mg Oral Q6H  . furosemide  20 mg Oral Daily  . levofloxacin  750 mg Oral Q24H  . loratadine  10 mg Oral Daily  . losartan  25 mg Oral Daily  . multivitamin with minerals  1 tablet Oral Daily  . omega-3 acid ethyl esters  1 g Oral Daily  . pantoprazole  40 mg Oral Daily  . sodium chloride  3 mL Intravenous Q12H  . vitamin B-12  100 mcg Oral Daily  . Warfarin - Pharmacist Dosing Inpatient   Does not apply q1800   Continuous Infusions: . sodium chloride Stopped (11/12/12 2042)  . sodium chloride Stopped (11/13/12 1400)    Time spent on care of this patient: 35 minute   [facility] L.ANP  [facility] Office  [phone] Pager - Text Page per Loretha Stapler as per below:  On-Call/Text Page:      [email or web address]      password [email or web address]  If 7PM-7AM, please  contact night-coverage [email or web address] Password [email or web address] 11/15/2012, 1:36 PM   LOS: 3 days   I have personally examined this patient and reviewed the entire database. I have reviewed the above note, made any necessary editorial changes, and agree with its content.  Lonia Blood, MD [facility]

## 2012-11-15 NOTE — Progress Notes (Signed)
OT Cancellation Note  Patient Details Name: Joe Jackson MRN: [id number] DOB: 1930/05/05   Cancelled Treatment:    Spoke with pt and wife who do not feel he needs OT at this time.  Will sign off.   [name] M [phone] 11/15/2012, 3:22 PM

## 2012-11-15 NOTE — Progress Notes (Signed)
ANTICOAGULATION CONSULT NOTE - Follow Up Consult  Pharmacy Consult for coumadin Indication: atrial fibrillation  Allergies  Allergen Reactions  . Ceftriaxone Sodium In Dextrose Other (See Comments)    redness and lip swelling    Patient Measurements: Height: 5\' 9"  (175.3 cm) Weight: 201 lb 15.1 oz (91.6 kg) IBW/kg (Calculated) : 70.7   Vital Signs: Temp: 97.5 F (36.4 C) (04/14 1712) Temp src: Oral (04/14 1712) BP: 166/96 mmHg (04/14 1712) Pulse Rate: 86 (04/14 1712)  Labs:  Recent Labs  11/12/12 2104 11/13/12 0208  11/13/12 0500 11/13/12 1004  11/14/12 0450 11/14/12 0813 11/15/12 0610 11/15/12 1000  HGB  --   --   < > 11.4*  --   --   --  11.5* 12.1*  --   HCT  --   --   --  35.1*  --   --   --  35.3* 36.8*  --   PLT  --   --   --  72*  --   --   --  78* 76*  --   LABPROT  --   --   --   --   --   < > 25.6*  --  15.2 15.0  INR  --   --   --   --   --   < > 2.47*  --  1.22 1.20  CREATININE  --   --   --  1.11  --   --   --  1.15 1.05  --   TROPONINI <0.30 <0.30  --   --  <0.30  --   --   --   --   --   < > = values in this interval not displayed.  Estimated Creatinine Clearance: 61.7 ml/min (by C-G formula based on Cr of 1.05).   Medications:  Scheduled:  . atorvastatin  20 mg Oral QHS  . bisoprolol  5 mg Oral Daily  . budesonide-formoterol  2 puff Inhalation BID  . diltiazem  30 mg Oral Q6H  . furosemide  20 mg Oral Daily  . [EXPIRED] iohexol  25 mL Oral Q1 Hr x 2  . levofloxacin  750 mg Oral Q24H  . loratadine  10 mg Oral Daily  . losartan  25 mg Oral Daily  . multivitamin with minerals  1 tablet Oral Daily  . omega-3 acid ethyl esters  1 g Oral Daily  . pantoprazole  40 mg Oral Daily  . sodium chloride  3 mL Intravenous Q12H  . vitamin B-12  100 mcg Oral Daily  . [DISCONTINUED] aspirin EC  81 mg Oral Daily  . [DISCONTINUED] Warfarin - Pharmacist Dosing Inpatient   Does not apply q1800    Assessment: 79 yo male admitted with A/C systolic HF. On  chronic Coumadin PTA, although wife notes this was just started 3/27 at last hospital admission.  Hematochezia has been noted with stable Hg 5mg  IV vitamin K given on 11/14/12). PLTC= 76K today and noted 77K on 10/23/12 and  86K 04/14/12.  AST/ALT= 90/183 on 11/13/12. Last coumadin dose 11/11/12 and last INR= 1.2 on 11/15/12.  Home coumadin dose 2.5mg /day  Goal of Therapy:  INR goal: 2-2.5 Monitor platelets by anticoagulation protocol: Yes   Plan:  -Coumadin 2.5mg  tonight -Daily PT/INR  Harland German, Pharm D 11/15/2012 7:39 PM

## 2012-11-15 NOTE — Progress Notes (Addendum)
Patient Name: Joe Jackson Date of Encounter: 11/15/2012     Principal Problem:   Acute and chronic respiratory failure Active Problems:   COPD   Atrial fibrillation with RVR   CAP (community acquired pneumonia)   CHF (congestive heart failure)   HTN (hypertension)   Diarrhea   Oral thrush   Edema of leg   Acute on chronic systolic heart failure   GI bleed    SUBJECTIVE  Patient in for respiratory failure and rapid ventricular response to atrial fib.  Not sure if INR is corrected today or not.  If so, it would preclude cardioversion.  Cardioversion also complicated by need for anticoagulation.    CURRENT MEDS . aspirin EC  81 mg Oral Daily  . atorvastatin  20 mg Oral QHS  . bisoprolol  5 mg Oral Daily  . budesonide-formoterol  2 puff Inhalation BID  . furosemide  20 mg Oral Daily  . levofloxacin  750 mg Oral Q24H  . loratadine  10 mg Oral Daily  . losartan  25 mg Oral Daily  . multivitamin with minerals  1 tablet Oral Daily  . omega-3 acid ethyl esters  1 g Oral Daily  . pantoprazole  40 mg Oral Daily  . sodium chloride  3 mL Intravenous Q12H  . vitamin B-12  100 mcg Oral Daily  . Warfarin - Pharmacist Dosing Inpatient   Does not apply q1800    OBJECTIVE  Filed Vitals:   11/14/12 2324 11/15/12 0400 11/15/12 0405 11/15/12 0600  BP:   154/63   Pulse:      Temp: 98.4 F (36.9 C) 98.4 F (36.9 C)    TempSrc: Oral Oral    Resp:  25 22   Height:      Weight:    201 lb 15.1 oz (91.6 kg)  SpO2: 93%  98%     Intake/Output Summary (Last 24 hours) at 11/15/12 0826 Last data filed at 11/15/12 0700  Gross per 24 hour  Intake   1013 ml  Output   1425 ml  Net   -412 ml   Filed Weights   11/12/12 2000 11/14/12 0500 11/15/12 0600  Weight: 200 lb 2.8 oz (90.8 kg) 198 lb 6.6 oz (90 kg) 201 lb 15.1 oz (91.6 kg)    PHYSICAL EXAM  General: Pleasant, NAD. Neuro: Alert and oriented X 3. Moves all extremities spontaneously. Psych: Normal affect. HEENT:   Normal  Neck: Supple without bruits or JVD. Lungs:  Moderate basilar ronchii Heart: irregular irregular rhythm with SEM  2/6.   Abdomen: Soft, non-tender, non-distended, BS + x 4.  Extremities: No clubbing, cyanosis or edema. DP/PT/Radials 2+ and equal bilaterally.  Accessory Clinical Findings  CBC  Recent Labs  11/14/12 0813 11/15/12 0610  WBC 10.1 9.0  HGB 11.5* 12.1*  HCT 35.3* 36.8*  MCV 85.1 84.6  PLT 78* 76*   Basic Metabolic Panel  Recent Labs  11/14/12 0813 11/15/12 0610  NA 139 140  K 4.0 3.9  CL 103 103  CO2 30 30  GLUCOSE 115* 102*  BUN 37* 31*  CREATININE 1.15 1.05  CALCIUM 8.2* 8.2*   Liver Function Tests  Recent Labs  11/13/12 0500  AST 90*  ALT 183*  ALKPHOS 91  BILITOT 0.8  PROT 5.3*  ALBUMIN 2.3*   No results found for this basename: LIPASE, AMYLASE,  in the last 72 hours Cardiac Enzymes  Recent Labs  11/12/12 2104 11/13/12 0208 11/13/12 1004  TROPONINI <0.30 <  0.30 <0.30   BNP No components found with this basename: POCBNP,  D-Dimer No results found for this basename: DDIMER,  in the last 72 hours Hemoglobin A1C No results found for this basename: HGBA1C,  in the last 72 hours Fasting Lipid Panel No results found for this basename: CHOL, HDL, LDLCALC, TRIG, CHOLHDL, LDLDIRECT,  in the last 72 hours Thyroid Function Tests  Recent Labs  11/12/12 2104  TSH 0.772    TELE  Atrial fib with RVR.      Radiology/Studies  Dg Chest 2 View  11/12/2012  *RADIOLOGY REPORT*  Clinical Data: Shortness of breath, weakness  CHEST - 2 VIEW  Comparison: 10/23/2012  Findings: Chronic interstitial markings.  Superimposed patchy/pleural based opacity in the lateral right lung, new, likely infectious/inflammatory.  Possible small right pleural effusion.  Cardiomegaly.  Mild degenerative changes of the visualized thoracolumbar spine.  IMPRESSION: Patchy/pleural based opacity in the lateral right lung, new, likely infectious/inflammatory.   Follow-up radiographs are suggested to document clearance.  Possible small right pleural effusion.  Underlying chronic interstitial markings.   Original Report Authenticated By: Charline Bills, M.D.    Dg Chest 2 View  10/20/2012  *RADIOLOGY REPORT*  Clinical Data: Cough and congestion for 3 days  CHEST - 2 VIEW  Comparison: 11/09/2011  Findings: The cardiac shadow is mildly enlarged but stable. Diffuse interstitial changes are noted bilaterally.  No focal infiltrate or sizable effusion is seen.  No acute bony abnormality is noted.  An old healed right clavicular fracture is noted.  IMPRESSION: Chronic changes without acute abnormality.   Original Report Authenticated By: Alcide Clever, M.D.    Dg Chest Portable 1 View  10/23/2012  *RADIOLOGY REPORT*  Clinical Data: Cough, shortness of breath  PORTABLE CHEST - 1 VIEW  Comparison: 10/20/2012  Findings: Prominent cardiomediastinal contours.  Chronic increased interstitial markings.  Peribronchial thickening.  Bibasilar opacities.  Trace effusions not excluded.  No pneumothorax.  No acute osseous finding.  IMPRESSION: Prominent cardiomediastinal contours, similar to prior.  Increased interstitial markings and peribronchial thickening, at least in part chronic.  A superimposed atypical infection or interstitial edema is not excluded.  Bibasilar opacities; atelectasis versus infiltrate.   Original Report Authenticated By: Jearld Lesch, M.D.     ASSESSMENT AND PLAN  1.  Atrial fib with RVR---still an issue.  Discussed with Dr. Gala Romney.  Will add dilt to regimen in lower dose.     2.  Thrombocytopenia   _  ?etiology    ?hematology consultation.  This will impact anticoagulation treatment.    Signed, Shawnie Pons MD, [facility], FSCAI

## 2012-11-15 NOTE — Progress Notes (Signed)
Patient seen, examined, and I agree with the above documentation, including the assessment and plan. No significant further bleeding, stable HGB INR has improved, with warfarin on hold. Discussed with patient and wife at length, and they do no wish to proceed with invasive testing such as colonoscopy.  This decision is reasonable in the setting of PNA, CHF, afib with RVR Low plts being evaluated with HIT panel, and peripheral smear LFT elevation unclear.  Will check additional labs and agree with CT abd pelvis (also will eval for colon/bowel abnormalities)

## 2012-11-16 DIAGNOSIS — I2589 Other forms of chronic ischemic heart disease: Secondary | ICD-10-CM

## 2012-11-16 LAB — HEPATIC FUNCTION PANEL
ALT: 82 U/L — ABNORMAL HIGH (ref 0–53)
AST: 17 U/L (ref 0–37)
Alkaline Phosphatase: 103 U/L (ref 39–117)
Bilirubin, Direct: 0.1 mg/dL (ref 0.0–0.3)
Indirect Bilirubin: 0.4 mg/dL (ref 0.3–0.9)
Total Bilirubin: 0.5 mg/dL (ref 0.3–1.2)

## 2012-11-16 LAB — PROTIME-INR
INR: 1.13 (ref 0.00–1.49)
Prothrombin Time: 14.3 seconds (ref 11.6–15.2)

## 2012-11-16 LAB — BASIC METABOLIC PANEL
Calcium: 8.2 mg/dL — ABNORMAL LOW (ref 8.4–10.5)
GFR calc non Af Amer: 64 mL/min — ABNORMAL LOW (ref >90)
Glucose, Bld: 150 mg/dL — ABNORMAL HIGH (ref 70–99)
Potassium: 3.8 mEq/L (ref 3.5–5.1)
Sodium: 140 mEq/L (ref 135–145)

## 2012-11-16 LAB — CBC
Hemoglobin: 11.7 g/dL — ABNORMAL LOW (ref 13.0–17.0)
MCHC: 32.9 g/dL (ref 30.0–36.0)
Platelets: 79 10*3/uL — ABNORMAL LOW (ref 150–400)
RBC: 4.23 MIL/uL (ref 4.22–5.81)

## 2012-11-16 MED ORDER — DILTIAZEM HCL 30 MG PO TABS
30.0000 mg | ORAL_TABLET | Freq: Three times a day (TID) | ORAL | Status: DC
  Administered 2012-11-16 – 2012-11-18 (×6): 30 mg via ORAL
  Filled 2012-11-16 (×9): qty 1

## 2012-11-16 MED ORDER — WARFARIN 0.5 MG HALF TABLET
3.5000 mg | ORAL_TABLET | Freq: Once | ORAL | Status: AC
  Administered 2012-11-16: 3.5 mg via ORAL
  Filled 2012-11-16: qty 1

## 2012-11-16 NOTE — Progress Notes (Signed)
Patient Name: Joe Jackson Date of Encounter: 11/16/2012     Principal Problem:   Acute and chronic respiratory failure Active Problems:   COPD   Atrial fibrillation with RVR   CAP (community acquired pneumonia)   CHF (congestive heart failure)   HTN (hypertension)   Diarrhea   Oral thrush   Edema of leg   Acute on chronic systolic heart failure   GI bleed    SUBJECTIVE  Feels a lot better.  No chest pain.  HR now in the 60s on low dose diltiazem.    CURRENT MEDS . atorvastatin  20 mg Oral QHS  . bisoprolol  5 mg Oral Daily  . budesonide-formoterol  2 puff Inhalation BID  . diltiazem  30 mg Oral Q6H  . furosemide  20 mg Oral Daily  . levofloxacin  750 mg Oral Q24H  . loratadine  10 mg Oral Daily  . losartan  25 mg Oral Daily  . multivitamin with minerals  1 tablet Oral Daily  . omega-3 acid ethyl esters  1 g Oral Daily  . pantoprazole  40 mg Oral Daily  . sodium chloride  3 mL Intravenous Q12H  . vitamin B-12  100 mcg Oral Daily  . Warfarin - Pharmacist Dosing Inpatient   Does not apply q1800    OBJECTIVE  Filed Vitals:   11/16/12 0400 11/16/12 0411 11/16/12 0620 11/16/12 0800  BP: 96/66  118/56 125/55  Pulse:      Temp:  98.3 F (36.8 C)  97.7 F (36.5 C)  TempSrc:  Oral  Oral  Resp: 16     Height:      Weight:      SpO2: 94%   95%    Intake/Output Summary (Last 24 hours) at 11/16/12 0829 Last data filed at 11/16/12 0626  Gross per 24 hour  Intake    480 ml  Output    750 ml  Net   -270 ml   Filed Weights   11/12/12 2000 11/14/12 0500 11/15/12 0600  Weight: 200 lb 2.8 oz (90.8 kg) 198 lb 6.6 oz (90 kg) 201 lb 15.1 oz (91.6 kg)    PHYSICAL EXAM  General: Pleasant, NAD. Neuro: Alert and oriented X 3. Moves all extremities spontaneously. Psych: Normal affect. HEENT:  Normal  Neck: Supple without bruits or JVD. Lungs:  Resp regular and unlabored, CTA. Heart: irregularly, irregularly rhythm.  SEM.  No change.   Abdomen: Soft, non-tender,  non-distended, BS + x 4.  Extremities: No clubbing, cyanosis or edema. DP/PT/Radials 2+ and equal bilaterally.  Accessory Clinical Findings  CBC  Recent Labs  11/15/12 0610 11/16/12 0435  WBC 9.0 8.3  HGB 12.1* 11.7*  HCT 36.8* 35.6*  MCV 84.6 84.2  PLT 76* 79*   Basic Metabolic Panel  Recent Labs  11/15/12 0610 11/16/12 0435  NA 140 140  K 3.9 3.8  CL 103 103  CO2 30 30  GLUCOSE 102* 150*  BUN 31* 28*  CREATININE 1.05 1.06  CALCIUM 8.2* 8.2*   Liver Function Tests  Recent Labs  11/16/12 0435  AST 17  ALT 82*  ALKPHOS 103  BILITOT 0.5  PROT 5.3*  ALBUMIN 2.4*   No results found for this basename: LIPASE, AMYLASE,  in the last 72 hours Cardiac Enzymes  Recent Labs  11/13/12 1004  TROPONINI <0.30   BNP No components found with this basename: POCBNP,  D-Dimer No results found for this basename: DDIMER,  in the last 72  hours Hemoglobin A1C No results found for this basename: HGBA1C,  in the last 72 hours Fasting Lipid Panel No results found for this basename: CHOL, HDL, LDLCALC, TRIG, CHOLHDL, LDLDIRECT,  in the last 72 hours Thyroid Function Tests No results found for this basename: TSH, T4TOTAL, FREET3, T3FREE, THYROIDAB,  in the last 72 hours  TELE  Rate in 60s afib.  Starting to get slow..    Radiology/Studies  Dg Chest 2 View  11/12/2012  *RADIOLOGY REPORT*  Clinical Data: Shortness of breath, weakness  CHEST - 2 VIEW  Comparison: 10/23/2012  Findings: Chronic interstitial markings.  Superimposed patchy/pleural based opacity in the lateral right lung, new, likely infectious/inflammatory.  Possible small right pleural effusion.  Cardiomegaly.  Mild degenerative changes of the visualized thoracolumbar spine.  IMPRESSION: Patchy/pleural based opacity in the lateral right lung, new, likely infectious/inflammatory.  Follow-up radiographs are suggested to document clearance.  Possible small right pleural effusion.  Underlying chronic interstitial  markings.   Original Report Authenticated By: Charline Bills, M.D.    Dg Chest 2 View  10/20/2012  *RADIOLOGY REPORT*  Clinical Data: Cough and congestion for 3 days  CHEST - 2 VIEW  Comparison: 11/09/2011  Findings: The cardiac shadow is mildly enlarged but stable. Diffuse interstitial changes are noted bilaterally.  No focal infiltrate or sizable effusion is seen.  No acute bony abnormality is noted.  An old healed right clavicular fracture is noted.  IMPRESSION: Chronic changes without acute abnormality.   Original Report Authenticated By: Alcide Clever, M.D.    Ct Abdomen Pelvis W Contrast  11/15/2012  *RADIOLOGY REPORT*  Clinical Data: Rectal bleeding.  Elevated liver function tests.  CT ABDOMEN AND PELVIS WITH CONTRAST  Technique:  Multidetector CT imaging of the abdomen and pelvis was performed following the standard protocol during bolus administration of intravenous contrast.  Contrast: OMNIPAQUE IOHEXOL 300 MG/ML  SOLN  Comparison: Chest x-rays dated 11/12/2012 and 05/01/2010  Findings: The liver, spleen, pancreas, adrenal glands, and kidneys demonstrate no significant abnormality.  There are vascular calcifications in each kidney and there is what I think is a 5 mm stone in the mid left kidney.  Biliary tree is normal.  The bowel appears normal with no visible mass, diverticular disease, or other abnormality.  Terminal ileum is normal.  The appendix is faintly visible and appears normal.  No acute osseous abnormality. 3.7 cm lesion in the subcutaneous fat of the left buttock, probably a sebaceous cyst.  Extensive atherosclerotic disease of the abdominal aorta.  IMPRESSION: No acute abnormality of the abdomen or pelvis.  Specifically, the bowel appears normal.  The patient does have  tiny bilateral pleural effusions with slight interstitial edema at the lung bases.  Pleural calcification at the right lung base.   Original Report Authenticated By: Francene Boyers, M.D.    Dg Chest Portable 1  View  10/23/2012  *RADIOLOGY REPORT*  Clinical Data: Cough, shortness of breath  PORTABLE CHEST - 1 VIEW  Comparison: 10/20/2012  Findings: Prominent cardiomediastinal contours.  Chronic increased interstitial markings.  Peribronchial thickening.  Bibasilar opacities.  Trace effusions not excluded.  No pneumothorax.  No acute osseous finding.  IMPRESSION: Prominent cardiomediastinal contours, similar to prior.  Increased interstitial markings and peribronchial thickening, at least in part chronic.  A superimposed atypical infection or interstitial edema is not excluded.  Bibasilar opacities; atelectasis versus infiltrate.   Original Report Authenticated By: Jearld Lesch, M.D.     ASSESSMENT AND PLAN  1.  Atrial fib with  RVR 2.  Pos stools 3.  Bradycardia on treatment 4.  AS  Would keep another day and reduce dilt to 30 q 8.  He will need close early fu with  GI service.  He will need also need close early fu with [facility] cardiology and I will get one of my colleagues to see him.    Signed, Shawnie Pons MD, [facility], FSCAI

## 2012-11-16 NOTE — Progress Notes (Signed)
Card PA notified concerning pt's BP and afternoon dose of Cardizem. Orders given to hold afternoon (1600) dose. Will recheck BP prior to 2200 dose, will continue to monitor.

## 2012-11-16 NOTE — Progress Notes (Signed)
ANTICOAGULATION CONSULT NOTE - Follow Up Consult  Pharmacy Consult for coumadin Indication: atrial fibrillation   Recent Labs  11/14/12 0813 11/15/12 0610 11/15/12 1000 11/16/12 0435  HGB 11.5* 12.1*  --  11.7*  HCT 35.3* 36.8*  --  35.6*  PLT 78* 76*  --  79*  LABPROT  --  15.2 15.0 14.3  INR  --  1.22 1.20 1.13  CREATININE 1.15 1.05  --  1.06    Estimated Creatinine Clearance: 61.1 ml/min (by C-G formula based on Cr of 1.06).  Assessment: 79 yo male admitted with A/C systolic HF. On chronic Coumadin PTA for atrial fibrillation (home dose currently 2.5mg  daily- recently started at previous hospital admission, INR 3.01 on admit with noted lower INR goal 2.0-2.5). Coumadin restarted last night after holding doses / receiving 5mg  vitamin K on 4/13 due to hematochezia. Hgb 11.7, Plts low 79 (but corrected to 110,000 per hematology). Hematology consulted and ok with resuming coumadin. INR remains sub-therapuetic as expected after Vitamin K on 4/13 and only 1 dose of coumadin. No overt bleeding issues reported in chart.  Goal of Therapy:  INR goal: 2-2.5 * Monitor platelets by anticoagulation protocol: Yes   Plan:  1) Coumadin 3.5 mg x 1 tonight 2) F/U INR in AM 3) Continue to monitor signs/symptoms of bleeding   Benjaman Pott, PharmD, BCPS 11/16/2012   1:24 PM

## 2012-11-16 NOTE — Consult Note (Signed)
I was asked to see Joe Jackson because of the thrombocytopenia. He is a really nice 79 year old gentleman. He has some pulmonary fibrosis/COPD. He is on Coumadin because of picture fibrillation. He does have ischemic cardiomyopathy. He also has sleep apnea.  His platelet count has been on the low side. He's never had any bleeding issues. He did not have a bout of hematochezia. GI has saw saw him but the patient does not wish to have a colonoscopy at this time. He did have a CT scan of the abdomen and pelvis yesterday. He had normal liver. Spleen was not enlarged.  Going back to March of this year, splenic count was 116. On April 11 his platelet count was 93. He subsequently has gone down to 72. Today, splenic count 79. His hemoglobin 11.7. White cell count 8.3.  I did look at his blood smear. I do not see anything that looked malignant. I do not see any dysplastic changes. I thought his platelet count probably was higher than 79. When I did a manual count on his platelets, I got about 110,000. There couple areas in which there may be some platelet clumping.  Of note, get a Doppler of his legs back on April 12 and no thromboembolic event was noted.  When he came in, he is on several medications. He was on atorvastatin. He also is on low-dose aspirin. He is on a diltiazem. He is on low-dose prednisone.  Again, is never had any bleeding issues in the past. He's had multiple surgeries without any problem.  On my exam, I cannot find anything that was focal. He was slightly obese. I cannot palpate his liver or spleen. There is some slight nonpitting edema of his right lower leg. No petechia or ecchymoses were are noted. Cardiac exam was irregular rate and rhythm consistent with his atrial fibrillation. Lungs showed decent breath sounds bilaterally.  I don't see any problems with him being on Coumadin. Although the platelet count is slightly depressed, his platelets looked as if they are normal and healthy in  appearance. He has several large platelets which indicates that i his bone marrow is producing them. I suppose he may have mild chronic immune thrombocytopenia but no way to know this is with a bone marrow test which I don't feel need to be done.  I don't think is been on aspirin. As long as his therapeutic on Coumadin I would think this would be appropriate.  It is any outpatient followup but I need to do on him, please feel free to let me know.  Of note, [name] did have a history of fairly heavy alcohol use when he tells me. He's  not had a drink for 3 years. This may or may not be a factor with respect to his thrombocytopenia.  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 He is a true American hero.  Pete E  Ph[place]ipians 1:20

## 2012-11-16 NOTE — Progress Notes (Signed)
Patient seen, examined, and I agree with the above documentation, including the assessment and plan. No further GI bleeding. Hemoglobin has been stable. LFTs trending down, unclear etiology but expect this will resolve entirely. Hypoperfusion is possible CT reviewed which was unrevealing in regards to the bowel no inflammation seen.

## 2012-11-16 NOTE — Progress Notes (Signed)
[facility] Progress Note [place] TEAM 1 - Stepdown/ICU TEAM   ARIEZ NEILAN ZOX:[id number] DOB: October 18, 1929 DOA: 11/12/2012 PCP: Loreen Freud, DO  HPI: 79 year old former smoker with pulmonary fibrosis, currently not on home oxygen, mild obstructive sleep apnea, who started having cough and dyspnea about one week prior to admission.  He failed outpt tx w/ Zpack and steroids.  He related before he could walk more than 100 feet without getting short of breath - now he can not even walk to the bathroom without getting short of breath/  [facility] a chest x-ray was done that shows pulmonary fibrosis and interstitial marking, a BNP was done that is 700, his saturations above 90% on 2 L. Temperature was checked that showed 100.2, and his CBC his white count is 11 with a left shift and toxic granulations.   Assessment/Plan:    Acute and chronic respiratory failure -Current respiratory symptoms exacerbation seems to be secondary to congestive heart failure from RVR and less likely pneumonia (see below)  ?Community-acquired pneumonia -Actually meets criteria for HCAP since last admit late March -He did have fever, cough and a chest x-ray that revealed a focal infiltrate in the right lung could have had an URI (bronchitis) that then precipitated AF/RVR -Continue Levaquin as symptoms are improving with this -check 2v CXR in am   Acute on chronic systolic heart failure -Appreciate cardiology input -Patient has improved significantly with one dose of IV Lasix - cardiology has started oral Lasix -Patient was previously on lisinopril/HCTZ-cardiology has placed him on losartan -Bisoprolol started this admit    Atrial fibrillation with RVR -Currently rate moderately controlled on short acting Cardizem and Bisoprolol/had some bradycardia overnite into 4/15 so Cards changed CCB frequency to q 8 hrs and requested inpatient monitoring for at least 24 more hours -his  progressive DOE could be due to RVR causing HF  Thrombocytopenia -Discussed with Dr. Myna Hidalgo 4/14 with mutual chart review -at this point no inidcation to stop Coumadin since this does not affect platelets -He did recommend stopping ASA and since no prior CAD or stroke history which would warrant anti-platelet therapy I have stopped (4/14) -peripheral smear is normal and heme corrected platelet count to 110,000 -heme suspects may have low grade chronic immune thrombocytopenia -Recent lovenox HIT panel pndg  GI bleed - Gi states that no plans to scope during this admission and needs to FU as an OP only if sx's recur - cont to follow - pt on Coumadin and thrombocytopenic - Coumadin held briefly and was given Vit K (4/13) -no further recurrences    HTN (hypertension) -Monitor closely with current medication change   COPD -Appears to be stable currently -Continue Symbicort and when necessary nebulizer treatments  Code Status: Full Family Communication: With patient Disposition Plan: Transfer to telemetry  Consultants: Cardiology GI  Procedures: None  Antibiotics:  Levaquin 4/11>>>  DVT prophylaxis: SCDs   HPI/Subjective: Denies further blood in stool. No CP/SOB. Aware of rec to remain IP due to med adjustments  Objective: Blood pressure 101/43, pulse 29, temperature 97.7 F (36.5 C), temperature source Oral, resp. rate 16, height 5\' 9"  (1.753 m), weight 91.6 kg (201 lb 15.1 oz), SpO2 91.00%.  Intake/Output Summary (Last 24 hours) at 11/16/12 1255 Last data filed at 11/16/12 0626  Gross per 24 hour  Intake    480 ml  Output    750 ml  Net   -270 ml    Exam: General:  No acute respiratory distress Lungs: Crackles in RLL Cardiovascular: Irregular rhythm with rates up to 60's/70's, atrial fibrillation rhythm; no murmur gallop or rub normal S1 and S2, trace peripheral,edema Abdomen: Nontender, nondistended, soft, bowel sounds positive, no rebound, no ascites, no  appreciable mass Extremities: No significant cyanosis, clubbing-mild edema bilateral lower extremities  Data Reviewed: Basic Metabolic Panel:  Recent Labs Lab 11/12/12 1652 11/13/12 0500 11/14/12 0813 11/15/12 0610 11/16/12 0435  NA 137 139 139 140 140  K 4.9 4.1 4.0 3.9 3.8  CL 105 104 103 103 103  CO2 23 30 30 30 30   GLUCOSE 151* 107* 115* 102* 150*  BUN 40* 39* 37* 31* 28*  CREATININE 1.06 1.11 1.15 1.05 1.06  CALCIUM 8.5 8.1* 8.2* 8.2* 8.2*   Liver Function Tests:  Recent Labs Lab 11/13/12 0500 11/16/12 0435  AST 90* 17  ALT 183* 82*  ALKPHOS 91 103  BILITOT 0.8 0.5  PROT 5.3* 5.3*  ALBUMIN 2.3* 2.4*   CBC:  Recent Labs Lab 11/12/12 1652 11/13/12 0500 11/14/12 0813 11/15/12 0610 11/16/12 0435  WBC 13.0* 11.2* 10.1 9.0 8.3  HGB 12.4* 11.4* 11.5* 12.1* 11.7*  HCT 37.6* 35.1* 35.3* 36.8* 35.6*  MCV 83.4 83.8 85.1 84.6 84.2  PLT 93* 72* 78* 76* 79*   Cardiac Enzymes:  Recent Labs Lab 11/12/12 1652 11/12/12 2104 11/13/12 0208 11/13/12 1004  TROPONINI <0.30 <0.30 <0.30 <0.30   BNP (last 3 results)  Recent Labs  10/23/12 0315 11/12/12 1652  PROBNP 728.0* 3423.0*    Studies:  Recent x-ray studies have been reviewed in detail by the Attending Physician  Scheduled Meds:  Scheduled Meds: . atorvastatin  20 mg Oral QHS  . bisoprolol  5 mg Oral Daily  . budesonide-formoterol  2 puff Inhalation BID  . diltiazem  30 mg Oral TID  . furosemide  20 mg Oral Daily  . levofloxacin  750 mg Oral Q24H  . loratadine  10 mg Oral Daily  . losartan  25 mg Oral Daily  . multivitamin with minerals  1 tablet Oral Daily  . omega-3 acid ethyl esters  1 g Oral Daily  . pantoprazole  40 mg Oral Daily  . sodium chloride  3 mL Intravenous Q12H  . vitamin B-12  100 mcg Oral Daily  . Warfarin - Pharmacist Dosing Inpatient   Does not apply q1800   Continuous Infusions:    Time spent on care of this patient: 35 minute   [facility].ANP  [facility] Office  [phone] Pager - Text Page per Loretha Stapler as per below:  On-Call/Text Page:      [email or web address]      password [email or web address]  If 7PM-7AM, please contact night-coverage [email or web address] Password [facility] 11/16/2012, 12:55 PM   LOS: 4 days   I have examined the patient, reviewed the chart and modified the above note which I agree with.   Pawan Knechtel,MD [phone] 11/16/2012, 7:19 PM

## 2012-11-16 NOTE — Progress Notes (Signed)
Notified Dr. Riley Kill of pt's wife's concern for edema in BLE "which has improved some". No new orders given. Dr. Riley Kill states, "As long as edema is coming down we will continue same lasix dose d/t change in all pt's meds."

## 2012-11-16 NOTE — Progress Notes (Signed)
[facility] Daily Rounding Note 11/16/2012, 8:38 AM  SUBJECTIVE:       No further rectal bleeding or diarrhea. Ate 100% of this AMs meal.   OBJECTIVE:         Vital signs in last 24 hours:    Temp:  [97.5 F (36.4 C)-98.6 F (37 C)] 97.7 F (36.5 C) (04/15 0800) Pulse Rate:  [86] 86 (04/14 1712) Resp:  [16-21] 16 (04/15 0400) BP: (96-166)/(55-96) 125/55 mmHg (04/15 0800) SpO2:  [91 %-96 %] 95 % (04/15 0800) Last BM Date: 11/15/12 General: frail, a bit lethargic   Heart: Irregular rate Chest: clear, unlabored Abdomen: soft, not tender, active BS  Extremities: no edema Neuro/Psych:  Lethargic, cooperative, no gross deficits.    Intake/Output from previous day: 04/14 0701 - 04/15 0700 In: 480 [P.O.:480] Out: 750 [Urine:750]  Intake/Output this shift:    Lab Results:  Recent Labs  11/14/12 0813 11/15/12 0610 11/16/12 0435  WBC 10.1 9.0 8.3  HGB 11.5* 12.1* 11.7*  HCT 35.3* 36.8* 35.6*  PLT 78* 76* 79*   BMET  Recent Labs  11/14/12 0813 11/15/12 0610 11/16/12 0435  NA 139 140 140  K 4.0 3.9 3.8  CL 103 103 103  CO2 30 30 30   GLUCOSE 115* 102* 150*  BUN 37* 31* 28*  CREATININE 1.15 1.05 1.06  CALCIUM 8.2* 8.2* 8.2*   LFT  Recent Labs  11/16/12 0435  PROT 5.3*  ALBUMIN 2.4*  AST 17  ALT 82*  ALKPHOS 103  BILITOT 0.5  BILIDIR 0.1  IBILI 0.4   PT/INR  Recent Labs  11/15/12 1000 11/16/12 0435  LABPROT 15.0 14.3  INR 1.20 1.13    Studies/Results: 11/15/2012    Ct Abdomen Pelvis W Contrast Findings: The liver, spleen, pancreas, adrenal glands, and kidneys  demonstrate no significant abnormality. There are vascular  calcifications in each kidney and there is what I think is a 5 mm  stone in the mid left kidney. Biliary tree is normal.  The bowel appears normal with no visible mass, diverticular  disease, or other abnormality. Terminal ileum is normal. The  appendix is faintly visible and appears normal.  No acute osseous abnormality.  3.7 cm lesion in the subcutaneous fat  of the left buttock, probably a sebaceous cyst. Extensive  atherosclerotic disease of the abdominal aorta. IMPRESSION: No acute abnormality of the abdomen or pelvis.  Specifically, the bowel appears normal.  The patient does have  tiny bilateral pleural effusions with slight interstitial edema at the lung bases.  Pleural calcification at the right lung base.   Original Report Authenticated By: Francene Boyers, M.D.    Ultrasound abdomen      ASSESMENT: * Limited hematochezia in setting of several days of diarrheal illness Coumadin. INR max of 3.02. Not associated with any drop in Hgb. Diarrhea and bleeding have resolved.  Suspect viral illness.  Unable to find any EGD or colonoscopy reports dating to 2001 (earliest Epic documents)  * A fib, rapid on admission, on chronic coumadin. On hold currently. Got vitamin K 4/13.  INR wnl. * Thrombocytopenia. New problem noted this admission. Dr Myna Hidalgo is evaluating.  * Elevated LFTs. Not known to have liver disease.  CT abdomen 4/14 shows no biliary or liver disease. LFTs normalizing.  Wonder if this could have been mild "shock liver", hypoperfusion in setting of dehydration, rapid a fib.  * CHF. EF 40 to 45%, grade 1 diastolic dysfunction, mild mitral valve and aortic valve  stenosis/clacification with trivial regurge.  * Acute renal failure, improved.  * OSA, pulmonary fibrosis, COPD.    PLAN: *  No plans for further work up:  No colonoscopy or EGD.  *  Can follow up with GI if he has recurrent issues but at present the gastrointestinal sxs he had last week have completely resolved and his is eating very well.    LOS: 4 days   Jennye Moccasin  11/16/2012, 8:38 AM Pager: [phone]

## 2012-11-16 NOTE — Progress Notes (Signed)
Report given to Martyn Malay, RN on unit 2000.  Pt just got lunch tray and will transfer after eating.

## 2012-11-17 ENCOUNTER — Inpatient Hospital Stay (HOSPITAL_COMMUNITY): Payer: Medicare PPO

## 2012-11-17 ENCOUNTER — Encounter: Payer: Medicare PPO | Admitting: Cardiology

## 2012-11-17 LAB — BASIC METABOLIC PANEL
BUN: 27 mg/dL — ABNORMAL HIGH (ref 6–23)
CO2: 32 mEq/L (ref 19–32)
Calcium: 8.7 mg/dL (ref 8.4–10.5)
Creatinine, Ser: 1.24 mg/dL (ref 0.50–1.35)
GFR calc non Af Amer: 53 mL/min — ABNORMAL LOW (ref >90)
Glucose, Bld: 110 mg/dL — ABNORMAL HIGH (ref 70–99)
Sodium: 141 mEq/L (ref 135–145)

## 2012-11-17 LAB — HEPARIN INDUCED THROMBOCYTOPENIA PNL
Heparin Induced Plt Ab: NEGATIVE
Patient O.D.: 0.028
UFH High Dose UFH H: 0 % Release
UFH SRA Result: NEGATIVE

## 2012-11-17 MED ORDER — WARFARIN SODIUM 3 MG PO TABS
3.5000 mg | ORAL_TABLET | Freq: Once | ORAL | Status: AC
  Administered 2012-11-17: 3.5 mg via ORAL
  Filled 2012-11-17: qty 1

## 2012-11-17 MED ORDER — FUROSEMIDE 10 MG/ML IJ SOLN
20.0000 mg | Freq: Once | INTRAMUSCULAR | Status: AC
  Administered 2012-11-17: 20 mg via INTRAVENOUS
  Filled 2012-11-17: qty 2

## 2012-11-17 NOTE — Progress Notes (Signed)
Occupational Therapy Discharge Patient Details Name: FITZGERALD DUNNE MRN: [id number] DOB: August 21, 1929 Today's Date: 11/17/2012 Time:  -     Patient discharged from OT services secondary to see OT note from 2 days ago (11/15/12).Evette Georges [phone] 11/17/2012, 3:32 PM

## 2012-11-17 NOTE — Progress Notes (Signed)
Patient Name: Joe Jackson Date of Encounter: 11/17/2012     Principal Problem:   Acute and chronic respiratory failure Active Problems:   COPD   Atrial fibrillation with RVR   CAP (community acquired pneumonia)   CHF (congestive heart failure)   HTN (hypertension)   Diarrhea   Oral thrush   Edema of leg   Acute on chronic systolic heart failure   GI bleed    SUBJECTIVE  Patient seem to be doing some better.  He is a little winded when he walks, but for the most part is improved.    CURRENT MEDS . atorvastatin  20 mg Oral QHS  . bisoprolol  5 mg Oral Daily  . budesonide-formoterol  2 puff Inhalation BID  . diltiazem  30 mg Oral TID  . furosemide  20 mg Oral Daily  . levofloxacin  750 mg Oral Q24H  . loratadine  10 mg Oral Daily  . losartan  25 mg Oral Daily  . multivitamin with minerals  1 tablet Oral Daily  . omega-3 acid ethyl esters  1 g Oral Daily  . pantoprazole  40 mg Oral Daily  . sodium chloride  3 mL Intravenous Q12H  . vitamin B-12  100 mcg Oral Daily  . warfarin  3.5 mg Oral ONCE-1800  . Warfarin - Pharmacist Dosing Inpatient   Does not apply q1800    OBJECTIVE  Filed Vitals:   11/16/12 2346 11/17/12 0432 11/17/12 0952 11/17/12 1326  BP:  147/44 122/60 100/59  Pulse: 75 82 76 72  Temp:  98.2 F (36.8 C)  98.4 F (36.9 C)  TempSrc:  Oral  Oral  Resp: 18 18  18   Height:      Weight:  199 lb 11.8 oz (90.6 kg)    SpO2: 97% 98%  100%    Intake/Output Summary (Last 24 hours) at 11/17/12 1355 Last data filed at 11/17/12 1300  Gross per 24 hour  Intake   1200 ml  Output    776 ml  Net    424 ml   Filed Weights   11/14/12 0500 11/15/12 0600 11/17/12 0432  Weight: 198 lb 6.6 oz (90 kg) 201 lb 15.1 oz (91.6 kg) 199 lb 11.8 oz (90.6 kg)    PHYSICAL EXAM  General: Pleasant, NAD. Neuro: Alert and oriented X 3. Moves all extremities spontaneously. Psych: Normal affect. HEENT:  Normal  Neck: Supple without bruits or JVD. Lungs:  Still has  some basilar crackles.   Heart: irregularly irregular rhythm.  2/6 SEM.  No DM.   Abdomen: Soft, non-tender, non-distended, BS + x 4.  Extremities: No clubbing, cyanosis or edema. DP/PT/Radials 2+ and equal bilaterally.  Accessory Clinical Findings  CBC  Recent Labs  11/15/12 0610 11/16/12 0435  WBC 9.0 8.3  HGB 12.1* 11.7*  HCT 36.8* 35.6*  MCV 84.6 84.2  PLT 76* 79*   Basic Metabolic Panel  Recent Labs  11/16/12 0435 11/17/12 0425  NA 140 141  K 3.8 4.9  CL 103 104  CO2 30 32  GLUCOSE 150* 110*  BUN 28* 27*  CREATININE 1.06 1.24  CALCIUM 8.2* 8.7   Liver Function Tests  Recent Labs  11/16/12 0435  AST 17  ALT 82*  ALKPHOS 103  BILITOT 0.5  PROT 5.3*  ALBUMIN 2.4*   No results found for this basename: LIPASE, AMYLASE,  in the last 72 hours Cardiac Enzymes No results found for this basename: CKTOTAL, CKMB, CKMBINDEX, TROPONINI,  in  the last 72 hours BNP No components found with this basename: POCBNP,  D-Dimer No results found for this basename: DDIMER,  in the last 72 hours Hemoglobin A1C No results found for this basename: HGBA1C,  in the last 72 hours Fasting Lipid Panel No results found for this basename: CHOL, HDL, LDLCALC, TRIG, CHOLHDL, LDLDIRECT,  in the last 72 hours Thyroid Function Tests No results found for this basename: TSH, T4TOTAL, FREET3, T3FREE, THYROIDAB,  in the last 72 hours  TELE  Rate controlled atrial fibrillation.    ECG    Radiology/Studies  Dg Chest 2 View  11/17/2012  *RADIOLOGY REPORT*  Clinical Data: Shortness of breath, CHF, follow up  CHEST - 2 VIEW  Comparison: 11/13/2038  Findings: Minimal enlargement cardiac silhouette. Tortuous aorta. Diffuse interstitial infiltrates with associated right basilar atelectasis. Small right pleural effusion slightly decreased. No pneumothorax. Bones demineralized.  IMPRESSION: Mild persistent interstitial infiltrates with slightly decreased right pleural effusion and basilar  atelectasis.   Original Report Authenticated By: Ulyses Southward, M.D.    Dg Chest 2 View  11/12/2012  *RADIOLOGY REPORT*  Clinical Data: Shortness of breath, weakness  CHEST - 2 VIEW  Comparison: 10/23/2012  Findings: Chronic interstitial markings.  Superimposed patchy/pleural based opacity in the lateral right lung, new, likely infectious/inflammatory.  Possible small right pleural effusion.  Cardiomegaly.  Mild degenerative changes of the visualized thoracolumbar spine.  IMPRESSION: Patchy/pleural based opacity in the lateral right lung, new, likely infectious/inflammatory.  Follow-up radiographs are suggested to document clearance.  Possible small right pleural effusion.  Underlying chronic interstitial markings.   Original Report Authenticated By: Charline Bills, M.D.    Dg Chest 2 View  10/20/2012  *RADIOLOGY REPORT*  Clinical Data: Cough and congestion for 3 days  CHEST - 2 VIEW  Comparison: 11/09/2011  Findings: The cardiac shadow is mildly enlarged but stable. Diffuse interstitial changes are noted bilaterally.  No focal infiltrate or sizable effusion is seen.  No acute bony abnormality is noted.  An old healed right clavicular fracture is noted.  IMPRESSION: Chronic changes without acute abnormality.   Original Report Authenticated By: Alcide Clever, M.D.    Ct Abdomen Pelvis W Contrast  11/15/2012  *RADIOLOGY REPORT*  Clinical Data: Rectal bleeding.  Elevated liver function tests.  CT ABDOMEN AND PELVIS WITH CONTRAST  Technique:  Multidetector CT imaging of the abdomen and pelvis was performed following the standard protocol during bolus administration of intravenous contrast.  Contrast: OMNIPAQUE IOHEXOL 300 MG/ML  SOLN  Comparison: Chest x-rays dated 11/12/2012 and 05/01/2010  Findings: The liver, spleen, pancreas, adrenal glands, and kidneys demonstrate no significant abnormality.  There are vascular calcifications in each kidney and there is what I think is a 5 mm stone in the mid left  kidney.  Biliary tree is normal.  The bowel appears normal with no visible mass, diverticular disease, or other abnormality.  Terminal ileum is normal.  The appendix is faintly visible and appears normal.  No acute osseous abnormality. 3.7 cm lesion in the subcutaneous fat of the left buttock, probably a sebaceous cyst.  Extensive atherosclerotic disease of the abdominal aorta.  IMPRESSION: No acute abnormality of the abdomen or pelvis.  Specifically, the bowel appears normal.  The patient does have  tiny bilateral pleural effusions with slight interstitial edema at the lung bases.  Pleural calcification at the right lung base.   Original Report Authenticated By: Francene Boyers, M.D.    Dg Chest Portable 1 View  10/23/2012  *RADIOLOGY REPORT*  Clinical Data: Cough, shortness of breath  PORTABLE CHEST - 1 VIEW  Comparison: 10/20/2012  Findings: Prominent cardiomediastinal contours.  Chronic increased interstitial markings.  Peribronchial thickening.  Bibasilar opacities.  Trace effusions not excluded.  No pneumothorax.  No acute osseous finding.  IMPRESSION: Prominent cardiomediastinal contours, similar to prior.  Increased interstitial markings and peribronchial thickening, at least in part chronic.  A superimposed atypical infection or interstitial edema is not excluded.  Bibasilar opacities; atelectasis versus infiltrate.   Original Report Authenticated By: Jearld Lesch, M.D.     ASSESSMENT AND PLAN  1.  Atrial fib with controlled ventricular response, improved 2.  Probable persistent volume overload  (which prompted admission to the hospital) 3.  Lower GI bleed 4.  Abnormal LFTs  Suggest  IV furosemide today Continue warfarin. He will need early fu in OP cardiology clinic---being arrange with Wende Mott early next week  (Monday) Suspect he can probably go home in am.    Signed, Shawnie Pons MD, [facility], [facility]

## 2012-11-17 NOTE — Progress Notes (Signed)
Physical Therapy Discharge Patient Details Name: Joe Jackson MRN: [id number] DOB: February 22, 1930 Today's Date: 11/17/2012 Time: 6578-4696 PT Time Calculation (min): 19 min  Patient discharged from PT services secondary to goals met and no further PT needs identified.  Please see latest therapy progress note for current level of functioning and progress toward goals.    Progress and discharge plan discussed with patient and/or caregiver: Patient/Caregiver agrees with plan  GP     [name] 11/17/2012, 4:12 PM

## 2012-11-17 NOTE — Progress Notes (Signed)
ANTICOAGULATION CONSULT NOTE - Follow Up Consult  Pharmacy Consult for coumadin Indication: atrial fibrillation   Recent Labs  11/15/12 0610 11/15/12 1000 11/16/12 0435 11/17/12 0425  HGB 12.1*  --  11.7*  --   HCT 36.8*  --  35.6*  --   PLT 76*  --  79*  --   LABPROT 15.2 15.0 14.3 14.7  INR 1.22 1.20 1.13 1.17  CREATININE 1.05  --  1.06 1.24    Estimated Creatinine Clearance: 52 ml/min (by C-G formula based on Cr of 1.24).  Assessment: 79 yo male admitted with A/C systolic HF. On chronic Coumadin PTA for atrial fibrillation (home dose currently 2.5mg  daily- recently started at previous hospital admission, INR 3.01 on admit with noted lower INR goal 2.0-2.5). Coumadin restarted 4/14 after holding doses / receiving 5mg  vitamin K on 4/13 due to hematochezia. Hematology consulted and ok with resuming coumadin. INR remains sub-therapuetic as expected after Vitamin K on 4/13. No overt bleeding issues reported in chart.  Goal of Therapy:  INR goal: 2-2.5 * Monitor platelets by anticoagulation protocol: Yes   Plan:  1) Coumadin 3.5 mg x 1 tonight 2) F/U INR in AM 3) Continue to monitor signs/symptoms of bleeding  [employer], Pharm.D., BCPS Clinical Pharmacist Pager [phone] 11/17/2012 10:55 AM

## 2012-11-17 NOTE — Progress Notes (Signed)
Physical Therapy Treatment Patient Details Name: Joe Jackson MRN: [id number] DOB: [birth year] Today's Date: 11/17/2012 Time: 6387-5643 PT Time Calculation (min): 19 min  PT Assessment / Plan / Recommendation Comments on Treatment Session  Pt independent for mobility. O2 remained above 93% with ambulation. No further acute PT needs at this time. Goals met. PT will sign off, patient in agreement.    Follow Up Recommendations  Home health PT;Supervision/Assistance - 24 hour;Other (comment)     Does the patient have the potential to tolerate intense rehabilitation     Barriers to Discharge        Equipment Recommendations  None recommended by PT    Recommendations for Other Services    Frequency     Plan All goals met and education completed, patient dischaged from PT services    Precautions / Restrictions     Pertinent Vitals/Pain No pain    Mobility  Bed Mobility Bed Mobility: Not assessed Transfers Transfers: Sit to Stand;Stand to Sit Sit to Stand: 6: Modified independent (Device/Increase time);With upper extremity assist;With armrests;From chair/3-in-1 Stand to Sit: 6: Modified independent (Device/Increase time);With upper extremity assist;With armrests;To chair/3-in-1 Ambulation/Gait Ambulation/Gait Assistance: 7: Independent Ambulation Distance (Feet): 200 Feet Assistive device: None Ambulation/Gait Assistance Details: no assistance needed, O2 >93% throughout Gait Pattern: Within Functional Limits      PT Goals Acute Rehab PT Goals PT Goal Formulation: With patient Time For Goal Achievement: 11/20/12 Potential to Achieve Goals: Good Pt will go Supine/Side to Sit: Independently;with HOB 0 degrees PT Goal: Supine/Side to Sit - Progress: Met Pt will go Sit to Stand: with modified independence;with upper extremity assist PT Goal: Sit to Stand - Progress: Met Pt will Ambulate: 51 - 150 feet;with modified independence;with least restrictive assistive device PT  Goal: Ambulate - Progress: Met  Visit Information  Last PT Received On: 11/17/12 Assistance Needed: +1    Subjective Data  Subjective: i'd like to get out of here before i turn 89 Patient Stated Goal: to go home   Cognition  Cognition Arousal/Alertness: Awake/alert Behavior During Therapy: WFL for tasks assessed/performed       End of Session PT - End of Session Equipment Utilized During Treatment: Gait belt Activity Tolerance: Patient tolerated treatment well Patient left: in chair;with call bell/phone within reach;with family/visitor present Nurse Communication: Mobility status   GP     [name] 11/17/2012, 4:12 PM [name], PT DPT  [phone]

## 2012-11-17 NOTE — Progress Notes (Signed)
[facility] PROGRESS NOTE  KENNA KIRN GNF:[id number] DOB: [birth year] DOA: 11/12/2012 PCP: Loreen Freud, DO  Assessment/Plan: Principal Problem:   Acute and chronic respiratory failure Active Problems:   COPD   Atrial fibrillation with RVR   CAP (community acquired pneumonia)   CHF (congestive heart failure)   HTN (hypertension)   Diarrhea   Oral thrush   Edema of leg   Acute on chronic systolic heart failure   GI bleed     HPI:  79 year old former smoker with pulmonary fibrosis, currently not on home oxygen, mild obstructive sleep apnea, who started having cough and dyspnea about one week prior to admission. He failed outpt tx w/ Zpack and steroids. He related before he could walk more than 100 feet without getting short of breath - now he can not even walk to the bathroom without getting short of breath/ [facility] a chest x-ray was done that shows pulmonary fibrosis and interstitial marking, a BNP was done that is 700, his saturations above 90% on 2 L. Temperature was checked that showed 100.2, and his CBC his white count is 11 with a left shift and toxic granulations.   Assessment/Plan:  Acute and chronic respiratory failure  -Current respiratory symptoms exacerbation seems to be secondary to congestive heart failure from RVR and less likely pneumonia (see below)    ?Community-acquired pneumonia  -Actually meets criteria for HCAP since last admit late March  -He did have fever, cough and a chest x-ray that revealed a focal infiltrate in the right lung could have had an URI (bronchitis) that then precipitated AF/RVR  -Continue Levaquin as symptoms are improving with this  Repeat chest x-ray    Acute on chronic systolic heart failure  EF 40 to 45%, grade 1 diastolic dysfunction, mild mitral valve and aortic valve stenosis/clacification with trivial regurge Appreciate cardiology input  -Patient has improved significantly with one dose of IV  Lasix  - cardiology has started oral Lasix  -Patient was previously on lisinopril/HCTZ-cardiology has placed him on losartan  -Bisoprolol started this admit     Atrial fibrillation with RVR  -Currently rate moderately controlled on short acting Cardizem and Bisoprolol/had some bradycardia overnite into 4/15 so Cards changed CCB frequency to q 8 hrs and requested inpatient monitoring for at least 24 more hours  -his progressive DOE could be due to RVR causing HF  Continue Coumadin for anticoagulation Will need GI followup    Thrombocytopenia  -Discussed with Dr. Myna Hidalgo 4/14 with mutual chart review  -at this point no inidcation to stop Coumadin since this does not affect platelets  -He did recommend stopping ASA and since no prior CAD or stroke history which would warrant anti-platelet therapy I have stopped (4/14)  -peripheral smear is normal and heme corrected platelet count to 110,000  -heme suspects may have low grade chronic immune thrombocytopenia  -Recent lovenox HIT panel pndg    GI bleed  - Gi states that no plans to scope during this admission and needs to FU as an OP only if sx's recur  - cont to follow - pt on Coumadin and thrombocytopenic  - Coumadin held briefly and was given Vit K (4/13)  -no further recurrences    HTN (hypertension)  -Monitor closely with current medication change  COPD  -Appears to be stable currently  -Continue Symbicort and when necessary nebulizer treatments    Code Status: Full  Family Communication: With patient  Disposition Plan: Physical therapy  evaluation   Consultants:  Cardiology  GI  Procedures:  None  Antibiotics:  Levaquin 4/11>>>  DVT prophylaxis: SCDs  HPI/Subjective:  Denies further blood in stool. No CP/SOB. Aware of rec to remain IP due to med adjustments     Objective: Filed Vitals:   11/16/12 1937 11/16/12 2034 11/16/12 2346 11/17/12 0432  BP: 133/58   147/44  Pulse: 80 79 75 82  Temp: 98.1 F (36.7 C)    98.2 F (36.8 C)  TempSrc: Oral   Oral  Resp: 18 18 18 18   Height:      Weight:    90.6 kg (199 lb 11.8 oz)  SpO2: 94% 95% 97% 98%    Intake/Output Summary (Last 24 hours) at 11/17/12 0943 Last data filed at 11/17/12 0758  Gross per 24 hour  Intake    960 ml  Output    776 ml  Net    184 ml    Exam:  General: No acute respiratory distress  Lungs: Crackles in RLL  Cardiovascular: Irregular rhythm with rates up to 60's/70's, atrial fibrillation rhythm; no murmur gallop or rub normal S1 and S2, trace peripheral,edema  Abdomen: Nontender, nondistended, soft, bowel sounds positive, no rebound, no ascites, no appreciable mass  Extremities: No significant cyanosis, clubbing-mild edema bilateral lower extremities   Data Reviewed: Basic Metabolic Panel:  Recent Labs Lab 11/13/12 0500 11/14/12 0813 11/15/12 0610 11/16/12 0435 11/17/12 0425  NA 139 139 140 140 141  K 4.1 4.0 3.9 3.8 4.9  CL 104 103 103 103 104  CO2 30 30 30 30  32  GLUCOSE 107* 115* 102* 150* 110*  BUN 39* 37* 31* 28* 27*  CREATININE 1.11 1.15 1.05 1.06 1.24  CALCIUM 8.1* 8.2* 8.2* 8.2* 8.7    Liver Function Tests:  Recent Labs Lab 11/13/12 0500 11/16/12 0435  AST 90* 17  ALT 183* 82*  ALKPHOS 91 103  BILITOT 0.8 0.5  PROT 5.3* 5.3*  ALBUMIN 2.3* 2.4*   No results found for this basename: LIPASE, AMYLASE,  in the last 168 hours No results found for this basename: AMMONIA,  in the last 168 hours  CBC:  Recent Labs Lab 11/12/12 1652 11/13/12 0500 11/14/12 0813 11/15/12 0610 11/16/12 0435  WBC 13.0* 11.2* 10.1 9.0 8.3  HGB 12.4* 11.4* 11.5* 12.1* 11.7*  HCT 37.6* 35.1* 35.3* 36.8* 35.6*  MCV 83.4 83.8 85.1 84.6 84.2  PLT 93* 72* 78* 76* 79*    Cardiac Enzymes:  Recent Labs Lab 11/12/12 1652 11/12/12 2104 11/13/12 0208 11/13/12 1004  TROPONINI <0.30 <0.30 <0.30 <0.30   BNP (last 3 results)  Recent Labs  10/23/12 0315 11/12/12 1652  PROBNP 728.0* 3423.0*      CBG:  Recent Labs Lab 11/17/12 0630  GLUCAP 102*    No results found for this or any previous visit (from the past 240 hour(s)).   Studies: Dg Chest 2 View  11/17/2012  *RADIOLOGY REPORT*  Clinical Data: Shortness of breath, CHF, follow up  CHEST - 2 VIEW  Comparison: 11/13/2038  Findings: Minimal enlargement cardiac silhouette. Tortuous aorta. Diffuse interstitial infiltrates with associated right basilar atelectasis. Small right pleural effusion slightly decreased. No pneumothorax. Bones demineralized.  IMPRESSION: Mild persistent interstitial infiltrates with slightly decreased right pleural effusion and basilar atelectasis.   Original Report Authenticated By: Ulyses Southward, M.D.    Dg Chest 2 View  11/12/2012  *RADIOLOGY REPORT*  Clinical Data: Shortness of breath, weakness  CHEST - 2 VIEW  Comparison: 10/23/2012  Findings:  Chronic interstitial markings.  Superimposed patchy/pleural based opacity in the lateral right lung, new, likely infectious/inflammatory.  Possible small right pleural effusion.  Cardiomegaly.  Mild degenerative changes of the visualized thoracolumbar spine.  IMPRESSION: Patchy/pleural based opacity in the lateral right lung, new, likely infectious/inflammatory.  Follow-up radiographs are suggested to document clearance.  Possible small right pleural effusion.  Underlying chronic interstitial markings.   Original Report Authenticated By: Charline Bills, M.D.    Dg Chest 2 View  10/20/2012  *RADIOLOGY REPORT*  Clinical Data: Cough and congestion for 3 days  CHEST - 2 VIEW  Comparison: 11/09/2011  Findings: The cardiac shadow is mildly enlarged but stable. Diffuse interstitial changes are noted bilaterally.  No focal infiltrate or sizable effusion is seen.  No acute bony abnormality is noted.  An old healed right clavicular fracture is noted.  IMPRESSION: Chronic changes without acute abnormality.   Original Report Authenticated By: Alcide Clever, M.D.    Ct Abdomen  Pelvis W Contrast  11/15/2012  *RADIOLOGY REPORT*  Clinical Data: Rectal bleeding.  Elevated liver function tests.  CT ABDOMEN AND PELVIS WITH CONTRAST  Technique:  Multidetector CT imaging of the abdomen and pelvis was performed following the standard protocol during bolus administration of intravenous contrast.  Contrast: OMNIPAQUE IOHEXOL 300 MG/ML  SOLN  Comparison: Chest x-rays dated 11/12/2012 and 05/01/2010  Findings: The liver, spleen, pancreas, adrenal glands, and kidneys demonstrate no significant abnormality.  There are vascular calcifications in each kidney and there is what I think is a 5 mm stone in the mid left kidney.  Biliary tree is normal.  The bowel appears normal with no visible mass, diverticular disease, or other abnormality.  Terminal ileum is normal.  The appendix is faintly visible and appears normal.  No acute osseous abnormality. 3.7 cm lesion in the subcutaneous fat of the left buttock, probably a sebaceous cyst.  Extensive atherosclerotic disease of the abdominal aorta.  IMPRESSION: No acute abnormality of the abdomen or pelvis.  Specifically, the bowel appears normal.  The patient does have  tiny bilateral pleural effusions with slight interstitial edema at the lung bases.  Pleural calcification at the right lung base.   Original Report Authenticated By: Francene Boyers, M.D.    Dg Chest Portable 1 View  10/23/2012  *RADIOLOGY REPORT*  Clinical Data: Cough, shortness of breath  PORTABLE CHEST - 1 VIEW  Comparison: 10/20/2012  Findings: Prominent cardiomediastinal contours.  Chronic increased interstitial markings.  Peribronchial thickening.  Bibasilar opacities.  Trace effusions not excluded.  No pneumothorax.  No acute osseous finding.  IMPRESSION: Prominent cardiomediastinal contours, similar to prior.  Increased interstitial markings and peribronchial thickening, at least in part chronic.  A superimposed atypical infection or interstitial edema is not excluded.  Bibasilar  opacities; atelectasis versus infiltrate.   Original Report Authenticated By: Jearld Lesch, M.D.     Scheduled Meds: . atorvastatin  20 mg Oral QHS  . bisoprolol  5 mg Oral Daily  . budesonide-formoterol  2 puff Inhalation BID  . diltiazem  30 mg Oral TID  . furosemide  20 mg Oral Daily  . levofloxacin  750 mg Oral Q24H  . loratadine  10 mg Oral Daily  . losartan  25 mg Oral Daily  . multivitamin with minerals  1 tablet Oral Daily  . omega-3 acid ethyl esters  1 g Oral Daily  . pantoprazole  40 mg Oral Daily  . sodium chloride  3 mL Intravenous Q12H  . vitamin B-12  100  mcg Oral Daily  . Warfarin - Pharmacist Dosing Inpatient   Does not apply q1800   Continuous Infusions:   Principal Problem:   Acute and chronic respiratory failure Active Problems:   COPD   Atrial fibrillation with RVR   CAP (community acquired pneumonia)   CHF (congestive heart failure)   HTN (hypertension)   Diarrhea   Oral thrush   Edema of leg   Acute on chronic systolic heart failure   GI bleed    Time spent: 40 minutes   [facility]  [facility] Pager [phone]. If 8PM-8AM, please contact night-coverage at [email or web address], password [facility] 11/17/2012, 9:43 AM  LOS: 5 days

## 2012-11-18 ENCOUNTER — Telehealth: Payer: Self-pay | Admitting: *Deleted

## 2012-11-18 LAB — GLUCOSE, CAPILLARY: Glucose-Capillary: 154 mg/dL — ABNORMAL HIGH (ref 70–99)

## 2012-11-18 MED ORDER — FUROSEMIDE 40 MG PO TABS: 20.0000 mg | ORAL_TABLET | Freq: Every day | ORAL | Status: DC

## 2012-11-18 MED ORDER — BISOPROLOL FUMARATE 5 MG PO TABS: 5.0000 mg | ORAL_TABLET | Freq: Every day | ORAL | Status: DC

## 2012-11-18 MED ORDER — DILTIAZEM HCL 30 MG PO TABS: 30.0000 mg | ORAL_TABLET | Freq: Three times a day (TID) | ORAL | Status: DC

## 2012-11-18 MED ORDER — LOSARTAN POTASSIUM 25 MG PO TABS: 25.0000 mg | ORAL_TABLET | Freq: Every day | ORAL | Status: DC

## 2012-11-18 MED ORDER — WARFARIN SODIUM 5 MG PO TABS: 5.0000 mg | ORAL_TABLET | Freq: Every day | ORAL | Status: DC

## 2012-11-18 MED ORDER — LEVOFLOXACIN 750 MG PO TABS: 750.0000 mg | ORAL_TABLET | ORAL | Status: AC

## 2012-11-18 NOTE — Telephone Encounter (Signed)
Called pts wife again and instructed her to have her husband take coumadin 5mg  today and then have pt take Coumadin 2.5mg  on Saturday and Sunday and we will recheck on Monday 11/22/2012 and she verbalized understanding

## 2012-11-18 NOTE — Discharge Summary (Signed)
Physician Discharge Summary  Joe Jackson MRN: [id number] DOB/AGE: September 14, 1929 79 y.o.  PCP: Loreen Freud, DO   Admit date: 11/12/2012 Discharge date: 11/18/2012  Discharge Diagnoses:   :   Acute and chronic respiratory failure Active Problems:   COPD   Atrial fibrillation with RVR   CAP (community acquired pneumonia)   CHF (congestive heart failure)   HTN (hypertension)   Diarrhea   Oral thrush   Edema of leg   Acute on chronic systolic heart failure   GI bleed     Medication List    STOP taking these medications       diltiazem 120 MG 24 hr capsule  Commonly known as:  CARDIZEM CD     predniSONE 10 MG tablet  Commonly known as:  DELTASONE      TAKE these medications              atorvastatin 40 MG tablet  Commonly known as:  LIPITOR  Take 0.5 tablets (20 mg total) by mouth at bedtime.     bisoprolol 5 MG tablet  Commonly known as:  ZEBETA  Take 1 tablet (5 mg total) by mouth daily.     budesonide-formoterol 160-4.5 MCG/ACT inhaler  Commonly known as:  SYMBICORT  Inhale 2 puffs into the lungs 2 (two) times daily.     cetirizine 10 MG tablet  Commonly known as:  ZYRTEC  Take 10 mg by mouth daily.     CO Q 10 PO  Take 1 tablet by mouth daily.     diltiazem 30 MG tablet  Commonly known as:  CARDIZEM  Take 1 tablet (30 mg total) by mouth 3 (three) times daily.     FISH OIL PO  Take 1 capsule by mouth daily.     furosemide 40 MG tablet  Commonly known as:  LASIX  Take 0.5 tablets (20 mg total) by mouth daily.     guaiFENesin 600 MG 12 hr tablet  Commonly known as:  MUCINEX  Take 1,200 mg by mouth daily.     levalbuterol 0.63 MG/3ML nebulizer solution  Commonly known as:  XOPENEX  Take 3 mLs (0.63 mg total) by nebulization every 2 (two) hours as needed for wheezing or shortness of breath.     levofloxacin 750 MG tablet  Commonly known as:  LEVAQUIN  Take 1 tablet (750 mg total) by mouth daily.     losartan 25 MG tablet  Commonly  known as:  COZAAR  Take 1 tablet (25 mg total) by mouth daily.     multivitamin with minerals Tabs  Take 1 tablet by mouth daily.     ODOR FREE GARLIC 100 MG Tabs  Generic drug:  Garlic  Take 1 tablet by mouth daily.     omeprazole 20 MG capsule  Commonly known as:  PRILOSEC  Take 1 capsule (20 mg total) by mouth daily.     VITAMIN B-12 PO  Take 1 tablet by mouth daily.     warfarin 5 MG tablet  Commonly known as:  COUMADIN  Take 1 tablet (5 mg total) by mouth daily.        Discharge Condition: stable  Disposition: 01-Home or Self Care   Consults: * Cardiology   Significant Diagnostic Studies: Dg Chest 2 View  11/17/2012  *RADIOLOGY REPORT*  Clinical Data: Shortness of breath, CHF, follow up  CHEST - 2 VIEW  Comparison: 11/13/2038  Findings: Minimal enlargement cardiac silhouette. Tortuous aorta. Diffuse interstitial infiltrates  with associated right basilar atelectasis. Small right pleural effusion slightly decreased. No pneumothorax. Bones demineralized.  IMPRESSION: Mild persistent interstitial infiltrates with slightly decreased right pleural effusion and basilar atelectasis.   Original Report Authenticated By: Ulyses Southward, M.D.    Dg Chest 2 View  11/12/2012  *RADIOLOGY REPORT*  Clinical Data: Shortness of breath, weakness  CHEST - 2 VIEW  Comparison: 10/23/2012  Findings: Chronic interstitial markings.  Superimposed patchy/pleural based opacity in the lateral right lung, new, likely infectious/inflammatory.  Possible small right pleural effusion.  Cardiomegaly.  Mild degenerative changes of the visualized thoracolumbar spine.  IMPRESSION: Patchy/pleural based opacity in the lateral right lung, new, likely infectious/inflammatory.  Follow-up radiographs are suggested to document clearance.  Possible small right pleural effusion.  Underlying chronic interstitial markings.   Original Report Authenticated By: Charline Bills, M.D.    Dg Chest 2 View  10/20/2012  *RADIOLOGY  REPORT*  Clinical Data: Cough and congestion for 3 days  CHEST - 2 VIEW  Comparison: 11/09/2011  Findings: The cardiac shadow is mildly enlarged but stable. Diffuse interstitial changes are noted bilaterally.  No focal infiltrate or sizable effusion is seen.  No acute bony abnormality is noted.  An old healed right clavicular fracture is noted.  IMPRESSION: Chronic changes without acute abnormality.   Original Report Authenticated By: Alcide Clever, M.D.    Ct Abdomen Pelvis W Contrast  11/15/2012  *RADIOLOGY REPORT*  Clinical Data: Rectal bleeding.  Elevated liver function tests.  CT ABDOMEN AND PELVIS WITH CONTRAST  Technique:  Multidetector CT imaging of the abdomen and pelvis was performed following the standard protocol during bolus administration of intravenous contrast.  Contrast: OMNIPAQUE IOHEXOL 300 MG/ML  SOLN  Comparison: Chest x-rays dated 11/12/2012 and 05/01/2010  Findings: The liver, spleen, pancreas, adrenal glands, and kidneys demonstrate no significant abnormality.  There are vascular calcifications in each kidney and there is what I think is a 5 mm stone in the mid left kidney.  Biliary tree is normal.  The bowel appears normal with no visible mass, diverticular disease, or other abnormality.  Terminal ileum is normal.  The appendix is faintly visible and appears normal.  No acute osseous abnormality. 3.7 cm lesion in the subcutaneous fat of the left buttock, probably a sebaceous cyst.  Extensive atherosclerotic disease of the abdominal aorta.  IMPRESSION: No acute abnormality of the abdomen or pelvis.  Specifically, the bowel appears normal.  The patient does have  tiny bilateral pleural effusions with slight interstitial edema at the lung bases.  Pleural calcification at the right lung base.   Original Report Authenticated By: Francene Boyers, M.D.    Dg Chest Portable 1 View  10/23/2012  *RADIOLOGY REPORT*  Clinical Data: Cough, shortness of breath  PORTABLE CHEST - 1 VIEW  Comparison:  10/20/2012  Findings: Prominent cardiomediastinal contours.  Chronic increased interstitial markings.  Peribronchial thickening.  Bibasilar opacities.  Trace effusions not excluded.  No pneumothorax.  No acute osseous finding.  IMPRESSION: Prominent cardiomediastinal contours, similar to prior.  Increased interstitial markings and peribronchial thickening, at least in part chronic.  A superimposed atypical infection or interstitial edema is not excluded.  Bibasilar opacities; atelectasis versus infiltrate.   Original Report Authenticated By: Jearld Lesch, M.D.      Microbiology: No results found for this or any previous visit (from the past 240 hour(s)).   Labs: Results for orders placed during the hospital encounter of 11/12/12 (from the past 48 hour(s))  PROTIME-INR     Status:  None   Collection Time    11/17/12  4:25 AM      Result Value Range   Prothrombin Time 14.7  11.6 - 15.2 seconds   INR 1.17  0.00 - 1.49  BASIC METABOLIC PANEL     Status: Abnormal   Collection Time    11/17/12  4:25 AM      Result Value Range   Sodium 141  135 - 145 mEq/L   Potassium 4.9  3.5 - 5.1 mEq/L   Comment: DELTA CHECK NOTED     NO VISIBLE HEMOLYSIS   Chloride 104  96 - 112 mEq/L   CO2 32  19 - 32 mEq/L   Glucose, Bld 110 (*) 70 - 99 mg/dL   BUN 27 (*) 6 - 23 mg/dL   Creatinine, Ser 7.84  0.50 - 1.35 mg/dL   Calcium 8.7  8.4 - 69.6 mg/dL   GFR calc non Af Amer 53 (*) >90 mL/min   GFR calc Af Amer 61 (*) >90 mL/min   Comment:            The eGFR has been calculated     using the CKD EPI equation.     This calculation has not been     validated in all clinical     situations.     eGFR's persistently     <90 mL/min signify     possible Chronic Kidney Disease.  GLUCOSE, CAPILLARY     Status: Abnormal   Collection Time    11/17/12  6:30 AM      Result Value Range   Glucose-Capillary 102 (*) 70 - 99 mg/dL  GLUCOSE, CAPILLARY     Status: Abnormal   Collection Time    11/17/12  9:09 PM       Result Value Range   Glucose-Capillary 154 (*) 70 - 99 mg/dL  PROTIME-INR     Status: Abnormal   Collection Time    11/18/12  4:25 AM      Result Value Range   Prothrombin Time 15.4 (*) 11.6 - 15.2 seconds   INR 1.24  0.00 - 1.49     HPI:  79 year old former smoker with pulmonary fibrosis, currently not on home oxygen, mild obstructive sleep apnea, who started having cough and dyspnea about one week prior to admission. He failed outpt tx w/ Zpack and steroids. He related before he could walk more than 100 feet without getting short of breath - now he can not even walk to the bathroom without getting short of breath/ [facility] a chest x-ray was done that shows pulmonary fibrosis and interstitial marking, a BNP was done that is 700, his saturations above 90% on 2 L. Temperature was checked that showed 100.2, and his CBC his white count is 11 with a left shift and toxic granulations.     Assessment/Plan:  Acute and chronic respiratory failure  -Current respiratory symptoms exacerbation seems to be secondary to congestive heart failure from RVR and less likely pneumonia (see below) dose of his Lasix has been increased to 40 mg a day, his atrial fibrillation is under control,  ?Community-acquired pneumonia  -Actually meets criteria for HCAP since last admit late March  -He did have fever, cough and a chest x-ray that revealed a focal infiltrate in the right lung could have had an URI (bronchitis) that then precipitated AF/RVR  He will continue with levofloxacin for another 5 days   Acute on chronic  systolic heart failure  EF 40 to 45%, grade 1 diastolic dysfunction, mild mitral valve and aortic valve stenosis/clacification with trivial regurge  Seen byThomas Cheri Kearns, MD,  during this hospitalization He received a couple of doses of IV Lasix and is one dose of Lasix has been increased to 40 mg a day -Patient was previously on lisinopril/HCTZ-cardiology has placed  him on losartan  -Bisoprolol started this admit  He will need a BMP in one week     Atrial fibrillation with RVR  -Currently rate moderately controlled on short acting Cardizem and Bisoprolol/had some bradycardia overnite into 4/15 so Cards changed CCB frequency to q 8 hrs and discontinued Cardizem CD 120 mg daily  Continue Coumadin for anticoagulation , currently subtherapeutic Will need an INR check on 4/18 and 4/21 Will need GI followup    Thrombocytopenia  -Discussed with Dr. Myna Hidalgo 4/14 with mutual chart review  -at this point no inidcation to stop Coumadin since this does not affect platelets  -He did recommend stopping ASA  -peripheral smear is normal and heme corrected platelet count to 110,000  -heme suspects may have low grade chronic immune thrombocytopenia  -Recent lovenox HIT panel pndg    GI bleed  - Gi states that no plans to scope during this admission and needs to FU as an OP only if sx's recur  - cont to follow - pt on Coumadin and thrombocytopenic  - Coumadin held briefly and was given Vit K (4/13)  -no further recurrences  Hemoglobin are stable   HTN (hypertension)  -Monitor closely with current medication change    COPD  -Appears to be stable currently  -Continue Symbicort and when necessary nebulizer treatments      Discharge Exam:   Blood pressure 113/68, pulse 80, temperature 98.4 F (36.9 C), temperature source Oral, resp. rate 18, height 5\' 9"  (1.753 m), weight 88.4 kg (194 lb 14.2 oz), SpO2 100.00%.  General: Pleasant, NAD.  Neuro: Alert and oriented X 3. Moves all extremities spontaneously.  Psych: Normal affect.  HEENT: Normal  Neck: Supple without bruits or JVD.  Lungs: Still has some basilar crackles.  Heart: irregularly irregular rhythm. 2/6 SEM. No DM.  Abdomen: Soft, non-tender, non-distended, BS + x 4.  Extremities: No clubbing, cyanosis or edema. DP/PT/Radials 2+ and equal bilaterally         Future Appointments  Provider Department Dept Phone   11/22/2012 10:30 AM Beatrice Lecher, PA-C [facility] Main Office [place]) [phone]   02/23/2013 8:30 AM [facility] [facility] [phone]   Eat a light meal the night before the exam but please avoid gaseous foods.   Nothing to eat or drink for at least 8 hours prior to the exam. No gum chewing or smoking the morning of the exam. Please take your morning medications with small sips of water, especially blood pressure medication. If you have several vascular lab exams and will see physician, please bring a snack with you.   02/23/2013 9:00 AM Evern Bio, NP [facility] [phone]        Signed: Richarda Overlie 11/18/2012, 12:35 PM

## 2012-11-18 NOTE — Progress Notes (Signed)
Pt and wife educated and informed of DC information. Verbalizes understanding of medications, HH services, and f/u appts. IV removed, pt ready for DC with wife home.

## 2012-11-18 NOTE — Telephone Encounter (Signed)
Pts wife called stating he is just home from hospital today. INR today is 1.24 home to take coumadin 5mg  daily and is on Levaquin so appt made for him to be seen on Monday 11/22/2012 Pts wife states understanding

## 2012-11-22 ENCOUNTER — Encounter: Payer: Self-pay | Admitting: Physician Assistant

## 2012-11-22 ENCOUNTER — Ambulatory Visit (INDEPENDENT_AMBULATORY_CARE_PROVIDER_SITE_OTHER): Payer: Medicare PPO | Admitting: Pharmacist

## 2012-11-22 ENCOUNTER — Encounter: Payer: Self-pay | Admitting: Pharmacist

## 2012-11-22 ENCOUNTER — Ambulatory Visit (INDEPENDENT_AMBULATORY_CARE_PROVIDER_SITE_OTHER): Payer: Medicare PPO | Admitting: Physician Assistant

## 2012-11-22 VITALS — BP 132/78 | HR 94 | Ht 69.0 in | Wt 204.0 lb

## 2012-11-22 DIAGNOSIS — D696 Thrombocytopenia, unspecified: Secondary | ICD-10-CM

## 2012-11-22 DIAGNOSIS — I10 Essential (primary) hypertension: Secondary | ICD-10-CM

## 2012-11-22 DIAGNOSIS — I350 Nonrheumatic aortic (valve) stenosis: Secondary | ICD-10-CM

## 2012-11-22 DIAGNOSIS — I480 Paroxysmal atrial fibrillation: Secondary | ICD-10-CM

## 2012-11-22 DIAGNOSIS — Z7901 Long term (current) use of anticoagulants: Secondary | ICD-10-CM

## 2012-11-22 DIAGNOSIS — E785 Hyperlipidemia, unspecified: Secondary | ICD-10-CM

## 2012-11-22 DIAGNOSIS — K922 Gastrointestinal hemorrhage, unspecified: Secondary | ICD-10-CM

## 2012-11-22 DIAGNOSIS — I219 Acute myocardial infarction, unspecified: Secondary | ICD-10-CM

## 2012-11-22 DIAGNOSIS — I4891 Unspecified atrial fibrillation: Secondary | ICD-10-CM

## 2012-11-22 DIAGNOSIS — I2510 Atherosclerotic heart disease of native coronary artery without angina pectoris: Secondary | ICD-10-CM

## 2012-11-22 DIAGNOSIS — I5042 Chronic combined systolic (congestive) and diastolic (congestive) heart failure: Secondary | ICD-10-CM

## 2012-11-22 LAB — HEPATIC FUNCTION PANEL
AST: 17 U/L (ref 0–37)
Albumin: 2.9 g/dL — ABNORMAL LOW (ref 3.5–5.2)
Alkaline Phosphatase: 95 U/L (ref 39–117)
Bilirubin, Direct: 0.1 mg/dL (ref 0.0–0.3)

## 2012-11-22 LAB — BASIC METABOLIC PANEL
BUN: 30 mg/dL — ABNORMAL HIGH (ref 6–23)
Chloride: 102 mEq/L (ref 96–112)
Potassium: 4.3 mEq/L (ref 3.5–5.1)
Sodium: 141 mEq/L (ref 135–145)

## 2012-11-22 LAB — BRAIN NATRIURETIC PEPTIDE: Pro B Natriuretic peptide (BNP): 219 pg/mL — ABNORMAL HIGH (ref 0.0–100.0)

## 2012-11-22 MED ORDER — FUROSEMIDE 40 MG PO TABS: 20.0000 mg | ORAL_TABLET | ORAL | Status: DC

## 2012-11-22 NOTE — Patient Instructions (Addendum)
INCREASE LASIX FOR 3 DAYS TO 40 MG DAILY; THEN RESUME 20 MG DAILY  LABS TODAY; BMET, BNP, LFT'S   REPEAT BMET IN 1 WEEK  PLEASE HAVE YOUR INR CHECKED WEEKLY FOR POSSIBLE DCCV  PLEASE FOLLOW UP WITH SCOTT WEAVER, PAC IN 2 WEEKS EITHER EARLY OR LATE APPTS  PLEASE FOLLOW UP WITH DR. Shirlee Latch IN 4-5 WEEKS EITHER EARLY OR LATE APPTS

## 2012-11-22 NOTE — Progress Notes (Addendum)
[address] [place], [place]  [address] Phone: [phone] Fax:  [phone]  Date:  11/22/2012   ID:  [name], DOB 1930-01-30, MRN [id number]  PCP:  Loreen Freud, DO  Primary Cardiologist:  Dr.  Shawnie Pons => Dr. Marca Ancona    History of Present Illness: Joe Jackson is a 79 y.o. male who returns for f/u after 2 recent hospital admissions.   He has a hx of CAD, s/p MI in 2001, combined systolic and diastolic CHF (EF 69% in the past), AAA (followed by Dr. Edilia Bo), pulmonary fibrosis (Dr. Vassie Loll), COPD, atrial fibrillation, HTN, HL.  Last cath in 2001 demonstrated an occluded OM and diffusely diseased RCA with distal occlusion and left to left and left to right collaterals.  He was seen by cardiology during an admission in 3/14 with AFib with RVR in the setting of a/c respiratory failure/pneumonia.  He was started on rate control therapy with diltiazem. He converted to NSR. He was placed on warfarin.  Echo 09/25/12: EF 40-45%, posterior lateral AK, grade 1 diastolic dysfunction, mild to moderate aortic stenosis (mean gradient 14), MAC, mild MR, mild LAE.  He was readmitted 4/11-4/17 with a/c respiratory failure secondary to acute on chronic combined systolic and diastolic CHF in the setting of possible healthcare associated pneumonia and recurrent AFib with RVR. He was diuresed. He was also placed on antibiotics. He noted bright blood per rectum. He was seen by gastroenterology with plans for outpatient followup. He remained on Coumadin. He was noted to have thrombocytopenia.  He was seen by hematology and aspirin was stopped.  HIT panel is negative.  He was thought to have a low-grade chronic immune thrombocytopenia.  RLE venous Doppler negative for DVT.  He was placed on bisoprolol for rate control and an ARB during this admission.    Since discharge, he continues to feel weak. He also notes dyspnea with exertion. He describes NYHA class III symptoms. He denies  orthopnea or PND. He continues to have lower extremity edema (right greater than left). Denies syncope. Denies chest pain.  Labs (4/14):  K 4.9, Cr 1.24, ALT 82, Hgb 11.7, PLT 79, TSH 0.772  Wt Readings from Last 3 Encounters:  11/22/12 204 lb (92.534 kg)  11/18/12 194 lb 14.2 oz (88.4 kg)  11/12/12 205 lb 9.6 oz (93.26 kg)     Past Medical History  Diagnosis Date  . Hypertension   . Hyperlipidemia   . Chronic combined systolic and diastolic CHF (congestive heart failure)     a. 10/2012 Echo: EF 40-45%, Gr1 dd, postlat AK, mild to mod AS, Triv AI, Mild MR, mildly dil LA.  . Sleep apnea   . Aortic aneurysm, abdominal     f/u by [facility]  . Pulmonary fibrosis   . Ischemic cardiomyopathy     a. EF 40-45% by echo 10/2012  . CAD (coronary artery disease)     a. s/p prior MI;  b. 11/1999 Cath: LM nl, LAD 43m, LCX min irregs, OM 100, RCA 80p, 100d, L->L and L->R collats, EF 54%.  Marland Kitchen COPD (chronic obstructive pulmonary disease)   . PAF (paroxysmal atrial fibrillation)     a. Dx 10/2012 during hosp for resp failure/copd flare  . Aortic stenosis     mild to mod by echo 10/2012 (mean gradient 14 mmHg)    Current Outpatient Prescriptions  Medication Sig Dispense Refill  . atorvastatin (LIPITOR) 40 MG tablet Take 0.5 tablets (20 mg total) by  mouth at bedtime.  45 tablet  3  . bisoprolol (ZEBETA) 5 MG tablet Take 1 tablet (5 mg total) by mouth daily.  30 tablet  0  . budesonide-formoterol (SYMBICORT) 160-4.5 MCG/ACT inhaler Inhale 2 puffs into the lungs 2 (two) times daily.  1 Inhaler  11  . cetirizine (ZYRTEC) 10 MG tablet Take 10 mg by mouth daily.        . Coenzyme Q10 (CO Q 10 PO) Take 1 tablet by mouth daily.       . Cyanocobalamin (VITAMIN B-12 PO) Take 1 tablet by mouth daily.       Marland Kitchen diltiazem (CARDIZEM) 30 MG tablet Take 1 tablet (30 mg total) by mouth 3 (three) times daily.  90 tablet  0  . furosemide (LASIX) 40 MG tablet Take 0.5 tablets (20 mg total) by mouth daily.  30 tablet  0  .  Garlic (ODOR FREE GARLIC) 100 MG TABS Take 1 tablet by mouth daily.        Marland Kitchen guaiFENesin (MUCINEX) 600 MG 12 hr tablet Take 1,200 mg by mouth daily.       Marland Kitchen levalbuterol (XOPENEX) 0.63 MG/3ML nebulizer solution Take 3 mLs (0.63 mg total) by nebulization every 2 (two) hours as needed for wheezing or shortness of breath.  3 mL  11  . levofloxacin (LEVAQUIN) 750 MG tablet Take 1 tablet (750 mg total) by mouth daily.  5 tablet  0  . losartan (COZAAR) 25 MG tablet Take 1 tablet (25 mg total) by mouth daily.  30 tablet  2  . Multiple Vitamin (MULITIVITAMIN WITH MINERALS) TABS Take 1 tablet by mouth daily.      . Omega-3 Fatty Acids (FISH OIL PO) Take 1 capsule by mouth daily.       Marland Kitchen omeprazole (PRILOSEC) 20 MG capsule Take 1 capsule (20 mg total) by mouth daily.  30 capsule  3  . warfarin (COUMADIN) 5 MG tablet Take 1 tablet (5 mg total) by mouth daily.  30 tablet  0   No current facility-administered medications for this visit.    Allergies:    Allergies  Allergen Reactions  . Ceftriaxone Sodium In Dextrose Other (See Comments)    redness and lip swelling    Social History:  The patient  reports that he quit smoking about 15 months ago. His smoking use included Cigarettes. He has a 62.4 pack-year smoking history. His smokeless tobacco use includes Chew. He reports that he does not drink alcohol or use illicit drugs.   ROS:  Please see the history of present illness.   Denies fevers, chills, cough, melena, hematochezia, vomiting, diarrhea.   All other systems reviewed and negative.   PHYSICAL EXAM: VS:  BP 132/78  Pulse 94  Ht 5\' 9"  (1.753 m)  Wt 204 lb (92.534 kg)  BMI 30.11 kg/m2 Well nourished, well developed, in no acute distress HEENT: normal Neck: no JVD (difficult to assess) Cardiac:  normal S1, S2; irregularly irregular rhythm; 2/6 systolic murmur heard best at the RUSB Lungs:  Decreased breath sounds bilaterally, no wheezing, rhonchi or rales Abd: soft, nontender, no  hepatomegaly Ext: trace-1+ bilateral LE edema, R > L Skin: warm and dry Neuro:  CNs 2-12 intact, no focal abnormalities noted  EKG:  Atrial fibrillation, HR 94, no change when compared to prior tracings     ASSESSMENT AND PLAN:  1. Atrial Fibrillation:  Rate controlled.  Continue current rx with diltiazem TID and bisoprolol.  Continue coumadin.  Question if  decreased exercise tolerance and DOE is related to AFib.  Check INR Q week.  May need to consider DCCV after 3-4 weeks of therapeutic INR. 2. Chronic Combined Systolic and Diastolic CHF:  Weights at home 196 => 199 since d/c.  May still have extra volume to lose.  Increase Lasix to 40 mg QD x 3 days. Then resume 20 mg QD.  Continue daily weights.  Continue beta blocker and ARB.  Check BMET and BNP today.   3. CAD:  No angina.  No ASA as he is on coumadin.  Continue statin. 4. Aortic Stenosis:  Mild to moderate by recent echo.  He will need periodic echocardiograms to follow this. 5. Hypertension:  Controlled.  Continue current therapy.  6. Hyperlipidemia:  Continue statin.  LFTs elevated in the hospital.  Repeat LFTs today. 7. Thrombocytopenia:  Follow up with PCP and hematology prn. 8. LGI Bleed:  Probably hemorrhoidal bleeding.  F/u with PCP and GI prn. 9. COPD:  F/u with pulmonology as planned. 10. Disposition:  F/u with me in 2 weeks.  Plan f/u with Dr. Marca Ancona in 4-5 weeks.  SignedTereso Newcomer, PA-C  11:16 AM 11/22/2012

## 2012-11-23 ENCOUNTER — Telehealth: Payer: Self-pay | Admitting: *Deleted

## 2012-11-23 ENCOUNTER — Ambulatory Visit (INDEPENDENT_AMBULATORY_CARE_PROVIDER_SITE_OTHER): Payer: Medicare PPO | Admitting: Family Medicine

## 2012-11-23 ENCOUNTER — Encounter: Payer: Self-pay | Admitting: Family Medicine

## 2012-11-23 VITALS — BP 124/62 | HR 72 | Temp 98.5°F | Wt 206.0 lb

## 2012-11-23 DIAGNOSIS — I509 Heart failure, unspecified: Secondary | ICD-10-CM

## 2012-11-23 DIAGNOSIS — I4891 Unspecified atrial fibrillation: Secondary | ICD-10-CM

## 2012-11-23 DIAGNOSIS — R609 Edema, unspecified: Secondary | ICD-10-CM

## 2012-11-23 DIAGNOSIS — N189 Chronic kidney disease, unspecified: Secondary | ICD-10-CM

## 2012-11-23 DIAGNOSIS — K922 Gastrointestinal hemorrhage, unspecified: Secondary | ICD-10-CM

## 2012-11-23 DIAGNOSIS — N289 Disorder of kidney and ureter, unspecified: Secondary | ICD-10-CM

## 2012-11-23 DIAGNOSIS — R600 Localized edema: Secondary | ICD-10-CM

## 2012-11-23 NOTE — Telephone Encounter (Signed)
mrs. Cauthon rtnd my call about lab results. she states pt already drinking ensure, not sure though if bid, if not will start bid. wife states yesterday she started increase lasix dose per Bing Neighbors. and pt wt up by 1 lb from yesterday w/increased edema. I will d/w Bing Neighbors. PAC and let pt know if there will be any changes to make. Wife said ok and thank you for my help today, I told her she was welcome

## 2012-11-23 NOTE — Telephone Encounter (Signed)
Message copied by Tarri Fuller on Tue Nov 23, 2012 10:14 AM ------      Message from: [place], [place] T      Created: Mon Nov 22, 2012  8:28 PM       LFTs ok      Protein and Albumin low - may be contributing to edema        -  Rec Ensure or Boost bid       Creatinine up some      Continue with current treatment plan.      Make sure repeat BMET scheduled for 1 week      Tereso Newcomer, PA-C  8:27 PM 11/22/2012 ------

## 2012-11-23 NOTE — Telephone Encounter (Signed)
per pt to call wife at [phone]. lmom for wife tcb and go over lab results and recommendations of ensure or boost due to low protein and albumin levels

## 2012-11-23 NOTE — Patient Instructions (Addendum)
Atrial Fibrillation Your caregiver has diagnosed you with atrial fibrillation (AFib). The heart normally beats very regularly; AFib is a type of irregular heartbeat. The heart rate may be faster or slower than normal. This can prevent your heart from pumping as well as it should. AFib can be constant (chronic) or intermittent (paroxysmal). CAUSES  Atrial fibrillation may be caused by:  Heart disease, including heart attack, coronary artery disease, heart failure, diseases of the heart valves, and others.  Blood clot in the lungs (pulmonary embolism).  Pneumonia or other infections.  Chronic lung disease.  Thyroid disease.  Toxins. These include alcohol, some medications (such as decongestant medications or diet pills), and caffeine. In some people, no cause for AFib can be found. This is referred to as Lone Atrial Fibrillation. SYMPTOMS   Palpitations or a fluttering in your chest.  A vague sense of chest discomfort.  Shortness of breath.  Sudden onset of lightheadedness or weakness. Sometimes, the first sign of AFib can be a complication of the condition. This could be a stroke or heart failure. DIAGNOSIS  Your description of your condition may make your caregiver suspicious of atrial fibrillation. Your caregiver will examine your pulse to determine if fibrillation is present. An EKG (electrocardiogram) will confirm the diagnosis. Further testing may help determine what caused you to have atrial fibrillation. This may include chest x-ray, echocardiogram, blood tests, or CT scans. PREVENTION  If you have previously had atrial fibrillation, your caregiver may advise you to avoid substances known to cause the condition (such as stimulant medications, and possibly caffeine or alcohol). You may be advised to use medications to prevent recurrence. Proper treatment of any underlying condition is important to help prevent recurrence. PROGNOSIS  Atrial fibrillation does tend to become a  chronic condition over time. It can cause significant complications (see below). Atrial fibrillation is not usually immediately life-threatening, but it can shorten your life expectancy. This seems to be worse in women. If you have lone atrial fibrillation and are under 60 years old, the risk of complications is very low, and life expectancy is not shortened. RISKS AND COMPLICATIONS  Complications of atrial fibrillation can include stroke, chest pain, and heart failure. Your caregiver will recommend treatments for the atrial fibrillation, as well as for any underlying conditions, to help minimize risk of complications. TREATMENT  Treatment for AFib is divided into several categories:  Treatment of any underlying condition.  Converting you out of AFib into a regular (sinus) rhythm.  Controlling rapid heart rate.  Prevention of blood clots and stroke. Medications and procedures are available to convert your atrial fibrillation to sinus rhythm. However, recent studies have shown that this may not offer you any advantage, and cardiac experts are continuing research and debate on this topic. More important is controlling your rapid heartbeat. The rapid heartbeat causes more symptoms, and places strain on your heart. Your caregiver will advise you on the use of medications that can control your heart rate. Atrial fibrillation is a strong stroke risk. You can lessen this risk by taking blood thinning medications such as Coumadin (warfarin), or sometimes aspirin. These medications need close monitoring by your caregiver. Over-medication can cause bleeding. Too little medication may not protect against stroke. HOME CARE INSTRUCTIONS   If your caregiver prescribed medicine to make your heartbeat more normally, take as directed.  If blood thinners were prescribed by your caregiver, take EXACTLY as directed.  Perform blood tests EXACTLY as directed.  Quit smoking. Smoking increases your cardiac and   lung  (pulmonary) risks.  DO NOT drink alcohol.  DO NOT drink caffeinated drinks (e.g. coffee, soda, chocolate, and leaf teas). You may drink decaffeinated coffee, soda or tea.  If you are overweight, you should choose a reduced calorie diet to lose weight. Please see a registered dietitian if you need more information about healthy weight loss. DO NOT USE DIET PILLS as they may aggravate heart problems.  If you have other heart problems that are causing AFib, you may need to eat a low salt, fat, and cholesterol diet. Your caregiver will tell you if this is necessary.  Exercise every day to improve your physical fitness. Stay active unless advised otherwise.  If your caregiver has given you a follow-up appointment, it is very important to keep that appointment. Not keeping the appointment could result in heart failure or stroke. If there is any problem keeping the appointment, you must call back to this facility for assistance. SEEK MEDICAL CARE IF:  You notice a change in the rate, rhythm or strength of your heartbeat.  You develop an infection or any other change in your overall health status. SEEK IMMEDIATE MEDICAL CARE IF:   You develop chest pain, abdominal pain, sweating, weakness or feel sick to your stomach (nausea).  You develop shortness of breath.  You develop swollen feet and ankles.  You develop dizziness, numbness, or weakness of your face or limbs, or any change in vision or speech. MAKE SURE YOU:   Understand these instructions.  Will watch your condition.  Will get help right away if you are not doing well or get worse. Document Released: 07/21/2005 Document Revised: 10/13/2011 Document Reviewed: 02/23/2008 [facility] Patient Information 2013 [place], [place]. Heart Failure Heart failure (HF) is a condition in which the heart has trouble pumping blood. This means your heart does not pump blood efficiently for your body to work well. In some cases of HF, fluid may back up  into your lungs or you may have swelling (edema) in your lower legs. HF is a long-term (chronic) condition. It is important for you to take good care of yourself and follow your caregiver's treatment plan. CAUSES   Health conditions:  High blood pressure (hypertension) causes the heart muscle to work harder than normal. When pressure in the blood vessels is high, the heart needs to pump (contract) with more force in order to circulate blood throughout the body. High blood pressure eventually causes the heart to become stiff and weak.  Coronary artery disease (CAD) is the buildup of cholesterol and fat (plaques) in the arteries of the heart. The blockage in the arteries deprives the heart muscle of oxygen and blood. This can cause chest pain and may lead to a heart attack. High blood pressure can also contribute to CAD.  Heart attack (myocardial infarction) occurs when 1 or more arteries in the heart become blocked. The loss of oxygen damages the muscle tissue of the heart. When this happens, part of the heart muscle dies. The injured tissue does not contract as well and weakens the heart's ability to pump blood.  Abnormal heart valves can cause HF when the heart valves do not open and close properly. This makes the heart muscle pump harder to keep the blood flowing.  Heart muscle disease (cardiomyopathy or myocarditis) is damage to the heart muscle from a variety of causes. These can include drug or alcohol abuse, infections, or unknown reasons. These can increase the risk of HF.  Lung disease makes the heart work  harder because the lungs do not work properly. This can cause a strain on the heart leading it to fail.  Diabetes increases the risk of HF. High blood sugar contributes to high fat (lipid) levels in the blood. Diabetes can also cause slow damage to tiny blood vessels that carry important nutrients to the heart muscle. When the heart does not get enough oxygen and food, it can cause the  heart to become weak and stiff. This leads to a heart that does not contract efficiently.  Other diseases can contribute to HF. These include abnormal heart rhythms, thyroid problems, and low blood counts (anemia).  Unhealthy lifestyle habits:  Obesity.  Smoking.  Eating foods high in fat and cholesterol.  Eating or drinking beverages high in salt.  Drug or alcohol abuse.  Lack of exercise. SYMPTOMS  HF symptoms may vary and can be hard to detect. Symptoms may include:  Shortness of breath with activity, such as climbing stairs.  Persistent cough.  Swelling of the feet, ankles, legs, or abdomen.  Unexplained weight gain.  Difficulty breathing when lying flat.  Waking from sleep because of the need to sit up and get more air.  Rapid heartbeat.  Fatigue and loss of energy.  Feeling lightheaded or close to fainting. DIAGNOSIS  A diagnosis of HF is based on your history, symptoms, physical examination, and diagnostic tests. Diagnostic tests for HF may include:  EKG.  Chest X-ray.  Blood tests.  Exercise stress test.  Blood oxygen test (arterial blood gas).  Evaluation by a heart doctor (cardiologist).  Ultrasound evaluation of the heart (echocardiogram).  Heart artery test to look for blockages (angiogram).  Radioactive imaging to look at the heart (radionuclide test). TREATMENT  Treatment is aimed at managing the symptoms of HF. Medicines, lifestyle changes, or surgical intervention may be necessary to treat HF.  Medicines to help treat HF may include:  Angiotensin-converting enzyme (ACE) inhibitors. These block the effects of a blood protein called angiotensin-converting enzyme. ACE inhibitors relax (dilate) the blood vessels and help lower blood pressure. This decreases the workload of the heart, slows the progression of HF, and improves symptoms.  Angiotensin receptor blockers (ARBs). These medications work similar to ACE inhibitors. ARBs may be an  alternative for people who cannot tolerate an ACE inhibitor.  Aldosterone antagonists. This medication helps get rid of extra fluid from your body. This lowers the volume of blood the heart has to pump.  Water pills (diuretics). Diuretics cause the kidneys to remove salt and water from the blood. The extra fluid is removed by urination. By removing extra fluid from the body, diuretics help lower the workload of the heart and help prevent fluid buildup in the lungs so breathing is easier.  Beta blockers. These prevent the heart from beating too fast and improve heart muscle strength. Beta blockers help maintain a normal heart rate, control blood pressure, and improve HF symptoms.  Digitalis. This increases the force of the heartbeat and may be helpful to people with HF or heart rhythm problems.  Healthy lifestyle changes include:  Stopping smoking.  Eating a healthy diet. Avoid foods high in fat. Avoid foods fried in oil or made with fat. A dietician can help with healthy food choices.  Limiting how much salt you eat.  Limiting alcohol intake to no more than 1 drink per day for women and 2 drinks per day for men. Drinking more than that is harmful to your heart. If your heart has already been damaged by  alcohol or you have severe HF, drinking alcohol should be stopped completely.  Exercising as directed by your caregiver.  Surgical treatment for HF may include:  Procedures to open blocked arteries, repair damaged heart valves, or remove damaged heart muscle tissue.  A pacemaker to help heart muscle function and to control certain abnormal heart rhythms.  A defibrillator to possibly prevent sudden cardiac death. HOME CARE INSTRUCTIONS   Activity level. Your caregiver can help you determine what type of exercise program may be helpful. It is important to maintain your strength. Pace your physical activity to avoid shortness of breath or chest pain. Rest for 1 hour before and after meals.  A cardiac rehabilitation program may be helpful to some people with HF.  Diet. Eat a heart healthy diet. Food choices should be low in saturated fat and cholesterol. Talk to a dietician to learn about heart healthy foods.  Salt intake. When you have HF, you need to limit the amount of salt you eat. Eat less than 1500 milligrams (mg) of salt per day or as recommended by your caregiver.  Weight monitoring. Weigh yourself every day. You should weigh yourself in the morning after you urinate and before you eat breakfast. Wear the same amount of clothing each time you weigh yourself. Record your weight daily. Bring your recorded weights to your clinic visits. Tell your caregiver right away if you have gained 3 lb/1.4 kg in 1 day, or 5 lb/2.3 kg in a week or whatever amount you were told to report.  Blood pressure monitoring. This should be done as directed by your caregiver. A home blood pressure cuff can be purchased at a drugstore. Record your blood pressure numbers and bring them to your clinic visits. Tell your caregiver if you become dizzy or lightheaded upon standing up.  Smoking. If you are currently a smoker, it is time to quit. Nicotine makes your heart work harder by causing your blood vessels to constrict. Do not use nicotine gum or patches before talking to your caregiver.  Follow up. Be sure to schedule a follow-up visit with your caregiver. Keep all your appointments. SEEK MEDICAL CARE IF:   Your weight increases by 3 lb/1.4 kg in 1 day or 5 lb/2.3 kg in a week.  You notice increasing shortness of breath that is unusual for you. This may happen during rest, sleep, or with activity.  You cough more than normal, especially with physical activity.  You notice more swelling in your hands, feet, ankles, or belly (abdomen).  You are unable to sleep because it is hard to breathe.  You cough up bloody mucus (sputum).  You begin to feel "jumping" or "fluttering" sensations (palpitations)  in your chest. SEEK IMMEDIATE MEDICAL CARE IF:   You have severe chest pain or pressure which may include symptoms such as:  Pain or pressure in the arms, neck, jaw, or back.  Feeling sweaty.  Feeling sick to your stomach (nauseous).  Feeling short of breath while at rest.  Having a fast or irregular heartbeat.  You experience stroke symptoms. These symptoms include:  Facial weakness or numbness.  Weakness or numbness in an arm, leg, or on one side of your body.  Blurred vision.  Difficulty talking or thinking.  Dizziness or fainting.  Severe headache. MAKE SURE YOU:   Understand these instructions.  Will watch your condition.  Will get help right away if you are not doing well or get worse. Document Released: 07/21/2005 Document Revised: 01/20/2012 Document Reviewed:  11/02/2009 [employer] Patient Information 2013 [place], [place].

## 2012-11-23 NOTE — Telephone Encounter (Signed)
Take Lasix 40 mg x 2 today. Continue with Lasix 40 mg QD starting tomorrow. Make sure he has f/u BMET in 1 week. Continue with ensure. Tereso Newcomer, PA-C  1:25 PM 11/23/2012

## 2012-11-24 DIAGNOSIS — N189 Chronic kidney disease, unspecified: Secondary | ICD-10-CM | POA: Insufficient documentation

## 2012-11-24 LAB — CBC WITH DIFFERENTIAL/PLATELET
Basophils Absolute: 0 10*3/uL (ref 0.0–0.1)
Eosinophils Absolute: 0.1 10*3/uL (ref 0.0–0.7)
HCT: 33.5 % — ABNORMAL LOW (ref 39.0–52.0)
Hemoglobin: 10.8 g/dL — ABNORMAL LOW (ref 13.0–17.0)
Lymphs Abs: 1.6 10*3/uL (ref 0.7–4.0)
MCHC: 32.4 g/dL (ref 30.0–36.0)
MCV: 85 fl (ref 78.0–100.0)
Monocytes Absolute: 0.5 10*3/uL (ref 0.1–1.0)
Monocytes Relative: 6.9 % (ref 3.0–12.0)
Neutro Abs: 5.2 10*3/uL (ref 1.4–7.7)
Platelets: 110 10*3/uL — ABNORMAL LOW (ref 150.0–400.0)
RDW: 16.6 % — ABNORMAL HIGH (ref 11.5–14.6)

## 2012-11-24 NOTE — Progress Notes (Signed)
Subjective:    Patient ID: Joe Jackson, male    DOB: 03-14-1930, 79 y.o.   MRN: [id number]  HPI Pt here f/u hospital visit for a fib and gi bleed --- d/c summary reviewed.  Pt feeling better.  Still somewhat sob. Pt daughter is with him.   Current outpatient prescriptions:atorvastatin (LIPITOR) 40 MG tablet, Take 0.5 tablets (20 mg total) by mouth at bedtime., Disp: 45 tablet, Rfl: 3;  bisoprolol (ZEBETA) 5 MG tablet, Take 1 tablet (5 mg total) by mouth daily., Disp: 30 tablet, Rfl: 0;  budesonide-formoterol (SYMBICORT) 160-4.5 MCG/ACT inhaler, Inhale 2 puffs into the lungs 2 (two) times daily., Disp: 1 Inhaler, Rfl: 11 cetirizine (ZYRTEC) 10 MG tablet, Take 10 mg by mouth daily.  , Disp: , Rfl: ;  Coenzyme Q10 (CO Q 10 PO), Take 1 tablet by mouth daily. , Disp: , Rfl: ;  Cyanocobalamin (VITAMIN B-12 PO), Take 1 tablet by mouth daily. , Disp: , Rfl: ;  diltiazem (CARDIZEM) 30 MG tablet, Take 1 tablet (30 mg total) by mouth 3 (three) times daily., Disp: 90 tablet, Rfl: 0 furosemide (LASIX) 40 MG tablet, Take 0.5 tablets (20 mg total) by mouth as directed. Increase to 40 daily for 3 days then resume 20 mg daily, Disp: , Rfl: ;  Garlic (ODOR FREE GARLIC) 100 MG TABS, Take 1 tablet by mouth daily.  , Disp: , Rfl: ;  guaiFENesin (MUCINEX) 600 MG 12 hr tablet, Take 1,200 mg by mouth daily. , Disp: , Rfl:  levalbuterol (XOPENEX) 0.63 MG/3ML nebulizer solution, Take 3 mLs (0.63 mg total) by nebulization every 2 (two) hours as needed for wheezing or shortness of breath., Disp: 3 mL, Rfl: 11;  levofloxacin (LEVAQUIN) 750 MG tablet, Take 1 tablet (750 mg total) by mouth daily., Disp: 5 tablet, Rfl: 0;  losartan (COZAAR) 25 MG tablet, Take 1 tablet (25 mg total) by mouth daily., Disp: 30 tablet, Rfl: 2 Multiple Vitamin (MULITIVITAMIN WITH MINERALS) TABS, Take 1 tablet by mouth daily., Disp: , Rfl: ;  Omega-3 Fatty Acids (FISH OIL PO), Take 1 capsule by mouth daily. , Disp: , Rfl: ;  omeprazole (PRILOSEC) 20 MG  capsule, Take 1 capsule (20 mg total) by mouth daily., Disp: 30 capsule, Rfl: 3;  warfarin (COUMADIN) 5 MG tablet, Take 1 tablet (5 mg total) by mouth daily., Disp: 30 tablet, Rfl: 0 Past Medical History  Diagnosis Date  . Hypertension   . Hyperlipidemia   . Chronic combined systolic and diastolic CHF (congestive heart failure)     a. 10/2012 Echo: EF 40-45%, Gr1 dd, postlat AK, mild to mod AS, Triv AI, Mild MR, mildly dil LA.  . Sleep apnea   . Aortic aneurysm, abdominal     f/u by [facility]  . Pulmonary fibrosis   . Ischemic cardiomyopathy     a. EF 40-45% by echo 10/2012  . CAD (coronary artery disease)     a. s/p prior MI;  b. 11/1999 Cath: LM nl, LAD 86m, LCX min irregs, OM 100, RCA 80p, 100d, L->L and L->R collats, EF 54%.  Marland Kitchen COPD (chronic obstructive pulmonary disease)   . PAF (paroxysmal atrial fibrillation)     a. Dx 10/2012 during hosp for resp failure/copd flare  . Aortic stenosis     mild to mod by echo 10/2012 (mean gradient 14 mmHg)   History   Social History  . Marital Status: Married    Spouse Name: N/A    Number of Children: N/A  . Years  of Education: N/A   Occupational History  . Not on file.   Social History Main Topics  . Smoking status: Former Smoker -- 0.80 packs/day for 78 years    Types: Cigarettes    Quit date: 08/05/2011  . Smokeless tobacco: Current User    Types: Chew    Last Attempt to Quit: 08/11/2011  . Alcohol Use: No  . Drug Use: No  . Sexually Active: Not on file   Other Topics Concern  . Not on file   Social History Narrative   Lives in [place] with wife.  Retired but active around the house/yard.   Family History  Problem Relation Age of Onset  . Heart disease Mother     Heart Disease before age  90  . Other Mother     Rheumatism  . Heart attack Mother   . Heart attack Father       Review of Systems    as above Objective:   Physical Exam BP 124/62  Pulse 72  Temp(Src) 98.5 F (36.9 C) (Oral)  Wt 206 lb (93.441 kg)  BMI  30.41 kg/m2  SpO2 95% General appearance: alert, cooperative, appears stated age and mild distress Neck: no adenopathy, no JVD, supple, symmetrical, trachea midline and thyroid not enlarged, symmetric, no tenderness/mass/nodules Lungs: diminished breath sounds bilaterally Heart: irregularly irregular rhythm Extremities: edema b/l R>L      Assessment & Plan:

## 2012-11-24 NOTE — Assessment & Plan Note (Signed)
Encourage pt to drink fluids --pt understands he has to be careful about drinking too much fluid secondary to chf.    Refer to nephrology-- secondary to increasing bun/cr

## 2012-11-24 NOTE — Assessment & Plan Note (Signed)
chf--- dec albumin Pt encouraged to add protein ---boost and to eat 3 meals a day Will refer to nutrition if needed

## 2012-11-24 NOTE — Assessment & Plan Note (Signed)
Diuretics increased per cardio

## 2012-11-25 ENCOUNTER — Telehealth: Payer: Self-pay | Admitting: Family Medicine

## 2012-11-25 NOTE — Telephone Encounter (Signed)
Pt needs an order for a [facility] Telemoniter system set up at home. Home Health nurse stated that the order needs to be faxed to [facility] [phone]. Pt is asking for someone to call and confirm it has been done.

## 2012-11-25 NOTE — Telephone Encounter (Signed)
That should come from cardiology

## 2012-11-25 NOTE — Telephone Encounter (Signed)
Please advise      KP 

## 2012-11-26 NOTE — Telephone Encounter (Signed)
Spoke with patient and he voiced understanding.     KP

## 2012-11-29 ENCOUNTER — Other Ambulatory Visit (INDEPENDENT_AMBULATORY_CARE_PROVIDER_SITE_OTHER): Payer: Medicare PPO

## 2012-11-29 ENCOUNTER — Ambulatory Visit (INDEPENDENT_AMBULATORY_CARE_PROVIDER_SITE_OTHER): Payer: Medicare PPO | Admitting: *Deleted

## 2012-11-29 DIAGNOSIS — I5042 Chronic combined systolic (congestive) and diastolic (congestive) heart failure: Secondary | ICD-10-CM

## 2012-11-29 DIAGNOSIS — Z7901 Long term (current) use of anticoagulants: Secondary | ICD-10-CM

## 2012-11-29 DIAGNOSIS — D696 Thrombocytopenia, unspecified: Secondary | ICD-10-CM

## 2012-11-29 DIAGNOSIS — I219 Acute myocardial infarction, unspecified: Secondary | ICD-10-CM

## 2012-11-29 DIAGNOSIS — I480 Paroxysmal atrial fibrillation: Secondary | ICD-10-CM

## 2012-11-29 DIAGNOSIS — I4891 Unspecified atrial fibrillation: Secondary | ICD-10-CM

## 2012-11-29 LAB — POCT INR: INR: 1.2

## 2012-11-30 ENCOUNTER — Telehealth: Payer: Self-pay | Admitting: *Deleted

## 2012-11-30 DIAGNOSIS — I5042 Chronic combined systolic (congestive) and diastolic (congestive) heart failure: Secondary | ICD-10-CM

## 2012-11-30 LAB — BASIC METABOLIC PANEL
BUN: 50 mg/dL — ABNORMAL HIGH (ref 6–23)
Calcium: 8.7 mg/dL (ref 8.4–10.5)
Creatinine, Ser: 1.5 mg/dL (ref 0.4–1.5)
GFR: 49.23 mL/min — ABNORMAL LOW (ref >60.00)

## 2012-11-30 MED ORDER — FUROSEMIDE 40 MG PO TABS: 20.0000 mg | ORAL_TABLET | Freq: Every day | ORAL | Status: DC

## 2012-11-30 NOTE — Telephone Encounter (Signed)
per husband to s/w wife about results, wife notified about lab results to hold lasix for 2 days then resume 20 mg daily, monitor weight daily and call if weight elevated 2-3 lb's, wife said ok and thank you.

## 2012-11-30 NOTE — Telephone Encounter (Signed)
Message copied by Tarri Fuller on Tue Nov 30, 2012  2:25 PM ------      Message from: [place], [place] T      Created: Tue Nov 30, 2012  1:51 PM       Potassium okay      Creatinine stable      BUN elevated-may be getting too dry      Hold Lasix x 2 days, Then resume      O/w Continue with current treatment plan.      Tereso Newcomer, PA-C  1:51 PM 11/30/2012          ------

## 2012-12-01 ENCOUNTER — Telehealth: Payer: Self-pay

## 2012-12-01 ENCOUNTER — Other Ambulatory Visit: Payer: Self-pay

## 2012-12-01 ENCOUNTER — Telehealth: Payer: Self-pay | Admitting: Physician Assistant

## 2012-12-01 DIAGNOSIS — R609 Edema, unspecified: Secondary | ICD-10-CM

## 2012-12-01 DIAGNOSIS — D649 Anemia, unspecified: Secondary | ICD-10-CM

## 2012-12-01 DIAGNOSIS — K625 Hemorrhage of anus and rectum: Secondary | ICD-10-CM

## 2012-12-01 NOTE — Telephone Encounter (Signed)
s/w HHRN Melissa who states pt wt increase 3 # since Monday, crackles in right base, pitting edema, dyspnea. Per Scott W. PA I advised pt's wife yesterday to have pt hold lasix wed, thurs, resume 20 mg daily 5/2. I will d/w PA and cb HHRN later today 

## 2012-12-01 NOTE — Telephone Encounter (Signed)
HHRN Joe Jackson notified per recommendations from [place]. PA to have pt take 40 mg lasix today and tomorrow and starting 5/2 Friday resume lasix 20 mg daily. HHRN read back instructions to me with verbal understanding,bmet 5/5 at PA visit

## 2012-12-01 NOTE — Telephone Encounter (Signed)
s/w HHRN Melissa who states pt wt increase 3 # since Monday, crackles in right base, pitting edema, dyspnea. Per Bing Neighbors PA I advised pt's wife yesterday to have pt hold lasix wed, thurs, resume 20 mg daily 5/2. I will d/w PA and cb HHRN later today

## 2012-12-01 NOTE — Addendum Note (Signed)
Addended by: Tarri Fuller on: 12/01/2012 05:19 PM   Modules accepted: Orders

## 2012-12-01 NOTE — Telephone Encounter (Signed)
FYI to MD---- [name] with [facility] called and stated pt was seen in office on Monday and lasix was held and and told to increase fluid intake, this morning pt has dyspnea, +2 pitting edema both legs, weight gain 3 lbs, crackles to right base. I reviewed the chart and patient is being managed by Cardiology. I made her aware due to patient's history he is being managed by Cardiology and Dr.Weaver would need to address so there would be no confusion. She voiced understanding and I gave her the number to Cardiology.    KP

## 2012-12-01 NOTE — Telephone Encounter (Signed)
Take Lasix 40 mg x 1 today and x 1 tomorrow.  Then resume 20 mg QD. Check BMET in 1 week. Tereso Newcomer, PA-C  4:52 PM 12/01/2012

## 2012-12-01 NOTE — Telephone Encounter (Signed)
New problem   Melissa/[facility] pt was seen in office on Monday and lasix was held and and told to increase fluid intake, this morning pt has dyspnea/ pitting edema both legs/weight gain 3 lbs, crackles to right base. Need to know what they need to do for pt. Please call Melissa

## 2012-12-02 ENCOUNTER — Encounter: Payer: Self-pay | Admitting: Gastroenterology

## 2012-12-06 ENCOUNTER — Telehealth: Payer: Self-pay | Admitting: *Deleted

## 2012-12-06 ENCOUNTER — Ambulatory Visit (INDEPENDENT_AMBULATORY_CARE_PROVIDER_SITE_OTHER): Payer: Medicare PPO | Admitting: Physician Assistant

## 2012-12-06 ENCOUNTER — Other Ambulatory Visit (INDEPENDENT_AMBULATORY_CARE_PROVIDER_SITE_OTHER): Payer: Medicare PPO

## 2012-12-06 ENCOUNTER — Ambulatory Visit (INDEPENDENT_AMBULATORY_CARE_PROVIDER_SITE_OTHER): Payer: Medicare PPO | Admitting: Pharmacist

## 2012-12-06 ENCOUNTER — Encounter: Payer: Self-pay | Admitting: Physician Assistant

## 2012-12-06 VITALS — BP 120/68 | HR 82 | Ht 69.0 in | Wt 204.0 lb

## 2012-12-06 DIAGNOSIS — Z7901 Long term (current) use of anticoagulants: Secondary | ICD-10-CM

## 2012-12-06 DIAGNOSIS — I2510 Atherosclerotic heart disease of native coronary artery without angina pectoris: Secondary | ICD-10-CM

## 2012-12-06 DIAGNOSIS — I359 Nonrheumatic aortic valve disorder, unspecified: Secondary | ICD-10-CM

## 2012-12-06 DIAGNOSIS — I10 Essential (primary) hypertension: Secondary | ICD-10-CM

## 2012-12-06 DIAGNOSIS — D696 Thrombocytopenia, unspecified: Secondary | ICD-10-CM

## 2012-12-06 DIAGNOSIS — I4891 Unspecified atrial fibrillation: Secondary | ICD-10-CM

## 2012-12-06 DIAGNOSIS — I5042 Chronic combined systolic (congestive) and diastolic (congestive) heart failure: Secondary | ICD-10-CM

## 2012-12-06 DIAGNOSIS — I350 Nonrheumatic aortic (valve) stenosis: Secondary | ICD-10-CM

## 2012-12-06 DIAGNOSIS — R609 Edema, unspecified: Secondary | ICD-10-CM

## 2012-12-06 DIAGNOSIS — I480 Paroxysmal atrial fibrillation: Secondary | ICD-10-CM

## 2012-12-06 DIAGNOSIS — I219 Acute myocardial infarction, unspecified: Secondary | ICD-10-CM

## 2012-12-06 LAB — BASIC METABOLIC PANEL
CO2: 29 mEq/L (ref 19–32)
Calcium: 8.7 mg/dL (ref 8.4–10.5)
Creatinine, Ser: 1.3 mg/dL (ref 0.4–1.5)
GFR: 55.79 mL/min — ABNORMAL LOW (ref >60.00)
Sodium: 140 mEq/L (ref 135–145)

## 2012-12-06 LAB — POCT INR: INR: 1.9

## 2012-12-06 MED ORDER — FUROSEMIDE 40 MG PO TABS: 40.0000 mg | ORAL_TABLET | Freq: Every day | ORAL | Status: DC

## 2012-12-06 MED ORDER — LOSARTAN POTASSIUM 25 MG PO TABS: 25.0000 mg | ORAL_TABLET | Freq: Every day | ORAL | Status: DC

## 2012-12-06 MED ORDER — BISOPROLOL FUMARATE 5 MG PO TABS: 5.0000 mg | ORAL_TABLET | Freq: Every day | ORAL | Status: DC

## 2012-12-06 MED ORDER — DILTIAZEM HCL 30 MG PO TABS: 30.0000 mg | ORAL_TABLET | Freq: Three times a day (TID) | ORAL | Status: DC

## 2012-12-06 NOTE — Telephone Encounter (Signed)
pt notified about labs ok, no changes to be made

## 2012-12-06 NOTE — Telephone Encounter (Signed)
Message copied by Tarri Fuller on Mon Dec 06, 2012  5:44 PM ------      Message from: [place], [place] T      Created: Mon Dec 06, 2012  4:26 PM       Potassium okay      Creatinine stable      Continue with current treatment plan.      Tereso Newcomer, PA-C  4:26 PM 12/06/2012 ------

## 2012-12-06 NOTE — Patient Instructions (Addendum)
INCREASE LASIX TO 40 MG DAILY  BMET TODAY  REPEAT BMET IN 1 WEEK  KEEP FOLLOW UP WITH DR. Shirlee Latch AS PLANNED  HAVE INR CHECKED WEEKLY FOR POSSIBLE DCCV  YOU HAVE BEEN GIVEN AN ORDER FOR COMPRESSION STOCKINGS

## 2012-12-06 NOTE — Progress Notes (Signed)
[address] [place], [place]  [address] Phone: [phone] Fax:  [phone]  Date:  12/06/2012   ID:  Joe Jackson, DOB [birth year], MRN [id number]  PCP:  Loreen Freud, DO  Primary Cardiologist:  Dr.  Shawnie Pons => Dr. Marca Ancona    History of Present Illness: Joe Jackson is a 79 y.o. male who returns for f/u.   He has a hx of CAD, s/p MI in 2001, combined systolic and diastolic CHF (EF 13% in the past), AAA (followed by Dr. Edilia Bo), pulmonary fibrosis (Dr. Vassie Loll), COPD, atrial fibrillation, HTN, HL.  Last cath in 2001 demonstrated an occluded OM and diffusely diseased RCA with distal occlusion and left to left and left to right collaterals.  He was seen by cardiology during an admission in 3/14 with AFib with RVR in the setting of a/c respiratory failure/pneumonia.  He was started on rate control therapy with diltiazem. He converted to NSR. He was placed on warfarin.  Echo 09/25/12: EF 40-45%, posterior lateral AK, grade 1 diastolic dysfunction, mild to moderate aortic stenosis (mean gradient 14), MAC, mild MR, mild LAE.  He was readmitted 4/11-4/17 with a/c respiratory failure secondary to acute on chronic combined systolic and diastolic CHF in the setting of possible healthcare associated pneumonia and recurrent AFib with RVR. He has hx of a low-grade chronic immune thrombocytopenia.  He was also to see GI for recent BRBPR.   I saw him in follow up 11/22/12. He was still somewhat volume overloaded. I increased his Lasix.  Since then he has had minimal improvement in his symptoms.  Feels like his breathing is somewhat better.  He probably describes NYHA Class IIb symptoms.  No orthopnea, PND.  His LE edema is worse some days than others.  Also notes increased abdominal girth that contributes to worse SOB. No CP.  No syncope.  Still feels fatigued but this seems to be somewhat better.  Labs (4/14):  K 4.9, Cr 1.24=>1.6=>1.5, ALT 82, Hgb 11.7=>10.8, PLT 79, TSH  0.772   Wt Readings from Last 3 Encounters:  12/06/12 204 lb (92.534 kg)  11/23/12 206 lb (93.441 kg)  11/22/12 204 lb (92.534 kg)     Past Medical History  Diagnosis Date  . Hypertension   . Hyperlipidemia   . Chronic combined systolic and diastolic CHF (congestive heart failure)     a. 10/2012 Echo: EF 40-45%, Gr1 dd, postlat AK, mild to mod AS, Triv AI, Mild MR, mildly dil LA.  . Sleep apnea   . Aortic aneurysm, abdominal     f/u by [facility]  . Pulmonary fibrosis   . Ischemic cardiomyopathy     a. EF 40-45% by echo 10/2012  . CAD (coronary artery disease)     a. s/p prior MI;  b. 11/1999 Cath: LM nl, LAD 24m, LCX min irregs, OM 100, RCA 80p, 100d, L->L and L->R collats, EF 54%.  Marland Kitchen COPD (chronic obstructive pulmonary disease)   . PAF (paroxysmal atrial fibrillation)     a. Dx 10/2012 during hosp for resp failure/copd flare  . Aortic stenosis     mild to mod by echo 10/2012 (mean gradient 14 mmHg)    Current Outpatient Prescriptions  Medication Sig Dispense Refill  . atorvastatin (LIPITOR) 40 MG tablet Take 0.5 tablets (20 mg total) by mouth at bedtime.  45 tablet  3  . bisacodyl (DULCOLAX) 5 MG EC tablet Take 5 mg by mouth daily as needed for constipation.      Marland Kitchen  bisoprolol (ZEBETA) 5 MG tablet Take 1 tablet (5 mg total) by mouth daily.  30 tablet  0  . budesonide-formoterol (SYMBICORT) 160-4.5 MCG/ACT inhaler Inhale 2 puffs into the lungs 2 (two) times daily.  1 Inhaler  11  . cetirizine (ZYRTEC) 10 MG tablet Take 10 mg by mouth daily.        . Coenzyme Q10 (CO Q 10 PO) Take 1 tablet by mouth daily.       . Cyanocobalamin (VITAMIN B-12 PO) Take 1 tablet by mouth daily.       Marland Kitchen diltiazem (CARDIZEM) 30 MG tablet Take 1 tablet (30 mg total) by mouth 3 (three) times daily.  90 tablet  0  . furosemide (LASIX) 40 MG tablet Take 0.5 tablets (20 mg total) by mouth daily.      . Garlic (ODOR FREE GARLIC) 100 MG TABS Take 1 tablet by mouth daily.        Marland Kitchen guaiFENesin (MUCINEX) 600 MG 12 hr  tablet Take 1,200 mg by mouth daily.       Marland Kitchen levalbuterol (XOPENEX) 0.63 MG/3ML nebulizer solution Take 3 mLs (0.63 mg total) by nebulization every 2 (two) hours as needed for wheezing or shortness of breath.  3 mL  11  . losartan (COZAAR) 25 MG tablet Take 1 tablet (25 mg total) by mouth daily.  30 tablet  2  . Multiple Vitamin (MULITIVITAMIN WITH MINERALS) TABS Take 1 tablet by mouth daily.      . Omega-3 Fatty Acids (FISH OIL PO) Take 1 capsule by mouth daily.       Marland Kitchen omeprazole (PRILOSEC) 20 MG capsule Take 1 capsule (20 mg total) by mouth daily.  30 capsule  3  . warfarin (COUMADIN) 5 MG tablet Take 1 tablet (5 mg total) by mouth daily.  30 tablet  0   No current facility-administered medications for this visit.    Allergies:    Allergies  Allergen Reactions  . Ceftriaxone Sodium In Dextrose Other (See Comments)    redness and lip swelling    Social History:  The patient  reports that he quit smoking about 16 months ago. His smoking use included Cigarettes. He has a 62.4 pack-year smoking history. His smokeless tobacco use includes Chew. He reports that he does not drink alcohol or use illicit drugs.   ROS:  Please see the history of present illness.   Denies cough, melena, hematochezia.   All other systems reviewed and negative.   PHYSICAL EXAM: VS:  BP 120/68  Pulse 82  Ht 5\' 9"  (1.753 m)  Wt 204 lb (92.534 kg)  BMI 30.11 kg/m2 Well nourished, well developed, in no acute distress HEENT: normal Neck: no JVD (difficult to assess) Cardiac:  normal S1, S2; irregularly irregular rhythm; 2/6 systolic murmur heard best at the RUSB Lungs:  Decreased breath sounds bilaterally, no wheezing, rhonchi or rales Abd: soft, nontender, no hepatomegaly Ext: 1-2+ bilateral LE edema  Skin: warm and dry Neuro:  CNs 2-12 intact, no focal abnormalities noted  EKG:  AFib, HR 82, no change from prior tracings     ASSESSMENT AND PLAN:  1. Atrial Fibrillation:  Rate controlled.  Continue  current rx with diltiazem TID and bisoprolol.  Continue coumadin.  Question if decreased exercise tolerance and DOE is related to AFib.  Continue to check INR Q week.  Last INR was sub-therapeutic.  May need to consider DCCV after 3-4 weeks of therapeutic INR. 2. Chronic Combined Systolic and Diastolic CHF:  He seems to better on Lasix 40 mg QD.   Therefore, I will increase him back to Lasix 40 mg QD.  Check BMET and repeat BMET in 1 week.  Suspect he has an element of R CHF in the setting of chronic lung disease.  I will give him a Rx for compression stockings as well.  He is watching his salt intake.  Continue beta blocker and ARB.   3. CAD:  No angina.  No ASA as he is on coumadin.  Continue statin. 4. Aortic Stenosis:  Mild to moderate by recent echo.  He will need periodic echocardiograms to follow this. 5. Hypertension:  Controlled.  Continue current therapy.  6. Hyperlipidemia:  Continue statin.    7. Thrombocytopenia:  Follow up with PCP and hematology prn. 8. LGI Bleed:  Probably hemorrhoidal bleeding.  He has referral to GI pending.  He should keep this appt. 9. COPD:  F/u with pulmonology as planned. 10. Disposition:  F/u with Dr. Marca Ancona in 4 weeks as planned.  Signed, Tereso Newcomer, PA-C  9:22 AM 12/06/2012

## 2012-12-13 ENCOUNTER — Other Ambulatory Visit (INDEPENDENT_AMBULATORY_CARE_PROVIDER_SITE_OTHER): Payer: Medicare PPO

## 2012-12-13 ENCOUNTER — Ambulatory Visit (INDEPENDENT_AMBULATORY_CARE_PROVIDER_SITE_OTHER): Payer: Medicare PPO | Admitting: *Deleted

## 2012-12-13 DIAGNOSIS — I480 Paroxysmal atrial fibrillation: Secondary | ICD-10-CM

## 2012-12-13 DIAGNOSIS — D696 Thrombocytopenia, unspecified: Secondary | ICD-10-CM

## 2012-12-13 DIAGNOSIS — I509 Heart failure, unspecified: Secondary | ICD-10-CM

## 2012-12-13 DIAGNOSIS — Z7901 Long term (current) use of anticoagulants: Secondary | ICD-10-CM

## 2012-12-13 DIAGNOSIS — E785 Hyperlipidemia, unspecified: Secondary | ICD-10-CM

## 2012-12-13 DIAGNOSIS — I2510 Atherosclerotic heart disease of native coronary artery without angina pectoris: Secondary | ICD-10-CM

## 2012-12-13 DIAGNOSIS — I4891 Unspecified atrial fibrillation: Secondary | ICD-10-CM

## 2012-12-13 DIAGNOSIS — D7289 Other specified disorders of white blood cells: Secondary | ICD-10-CM

## 2012-12-13 DIAGNOSIS — I219 Acute myocardial infarction, unspecified: Secondary | ICD-10-CM

## 2012-12-13 DIAGNOSIS — I10 Essential (primary) hypertension: Secondary | ICD-10-CM

## 2012-12-13 LAB — POCT INR: INR: 1.6

## 2012-12-14 LAB — BASIC METABOLIC PANEL
Calcium: 8.9 mg/dL (ref 8.4–10.5)
Creatinine, Ser: 1.5 mg/dL (ref 0.4–1.5)
GFR: 47.71 mL/min — ABNORMAL LOW (ref >60.00)

## 2012-12-17 DIAGNOSIS — D696 Thrombocytopenia, unspecified: Secondary | ICD-10-CM

## 2012-12-17 DIAGNOSIS — K922 Gastrointestinal hemorrhage, unspecified: Secondary | ICD-10-CM

## 2012-12-17 DIAGNOSIS — R7989 Other specified abnormal findings of blood chemistry: Secondary | ICD-10-CM

## 2012-12-17 DIAGNOSIS — I10 Essential (primary) hypertension: Secondary | ICD-10-CM

## 2012-12-17 DIAGNOSIS — I359 Nonrheumatic aortic valve disorder, unspecified: Secondary | ICD-10-CM

## 2012-12-17 DIAGNOSIS — I2510 Atherosclerotic heart disease of native coronary artery without angina pectoris: Secondary | ICD-10-CM

## 2012-12-17 DIAGNOSIS — E785 Hyperlipidemia, unspecified: Secondary | ICD-10-CM

## 2012-12-20 ENCOUNTER — Encounter: Payer: Self-pay | Admitting: Family Medicine

## 2012-12-20 ENCOUNTER — Ambulatory Visit (INDEPENDENT_AMBULATORY_CARE_PROVIDER_SITE_OTHER): Payer: Medicare PPO | Admitting: Family Medicine

## 2012-12-20 ENCOUNTER — Ambulatory Visit (INDEPENDENT_AMBULATORY_CARE_PROVIDER_SITE_OTHER): Payer: Medicare PPO | Admitting: *Deleted

## 2012-12-20 ENCOUNTER — Encounter: Payer: Self-pay | Admitting: Gastroenterology

## 2012-12-20 ENCOUNTER — Ambulatory Visit (INDEPENDENT_AMBULATORY_CARE_PROVIDER_SITE_OTHER): Payer: Medicare PPO | Admitting: Gastroenterology

## 2012-12-20 VITALS — BP 110/78 | HR 78 | Ht 69.0 in | Wt 203.6 lb

## 2012-12-20 VITALS — BP 98/58 | HR 67 | Wt 203.0 lb

## 2012-12-20 DIAGNOSIS — E1129 Type 2 diabetes mellitus with other diabetic kidney complication: Secondary | ICD-10-CM | POA: Insufficient documentation

## 2012-12-20 DIAGNOSIS — Z7901 Long term (current) use of anticoagulants: Secondary | ICD-10-CM

## 2012-12-20 DIAGNOSIS — I4891 Unspecified atrial fibrillation: Secondary | ICD-10-CM

## 2012-12-20 DIAGNOSIS — E1159 Type 2 diabetes mellitus with other circulatory complications: Secondary | ICD-10-CM

## 2012-12-20 DIAGNOSIS — I219 Acute myocardial infarction, unspecified: Secondary | ICD-10-CM

## 2012-12-20 DIAGNOSIS — K921 Melena: Secondary | ICD-10-CM

## 2012-12-20 DIAGNOSIS — D696 Thrombocytopenia, unspecified: Secondary | ICD-10-CM

## 2012-12-20 DIAGNOSIS — I480 Paroxysmal atrial fibrillation: Secondary | ICD-10-CM

## 2012-12-20 LAB — POCT INR: INR: 2

## 2012-12-20 MED ORDER — SAXAGLIPTIN HCL 2.5 MG PO TABS: 2.5000 mg | ORAL_TABLET | Freq: Every day | ORAL | Status: DC

## 2012-12-20 NOTE — Progress Notes (Signed)
  Subjective:     Joe Jackson is a 79 y.o. male who presents with new onset of Type 2 diabetes.. Current symptoms include: none. Patient denies foot ulcerations, hyperglycemia, hypoglycemia , increased appetite, nausea, paresthesia of the feet, polydipsia, polyuria, visual disturbances, vomiting and weight loss. Evaluation to date has been: fasting blood sugar, fasting lipid panel and hemoglobin A1C. Home sugars: patient does not check sugars. Current treatments: none. Last dilated eye exam pt sees oph q 6 mo.  The following portions of the patient's history were reviewed and updated as appropriate: allergies, current medications, past family history, past medical history, past social history, past surgical history and problem list.  Review of Systems Pertinent items are noted in HPI.    Objective:    BP 98/58  Pulse 67  Wt 203 lb (92.08 kg)  BMI 29.96 kg/m2  SpO2 95% General appearance: alert, cooperative, appears stated age and no distress Lungs: clear to auscultation bilaterally Heart: S1, S2 normal  Patient was not evaluated for proper footwear and sizing.  Laboratory: Lab Results  Component Value Date   HGBA1C 6.3* 10/25/2012       Assessment:    Diabetes mellitus Type II, under fair control.    Plan:    Discussed general issues about diabetes pathophysiology and management. Agricultural engineer distributed. Addressed ADA diet. Discussed foot care. Reminded to get yearly retinal exam. Continued statin drug see medication orders. Continued ACE inhibitor; see medication orders. Labs: fasting blood sugar, fasting lipid panel, hemoglobin A1C and microalbuminuria. Diabetes educator referral. Follow up in 3 months or as needed.  Start onglyza

## 2012-12-20 NOTE — Patient Instructions (Signed)
If you have more problems with bleeding please call our office.

## 2012-12-20 NOTE — Progress Notes (Signed)
History of Present Illness: This is an 79 year old male accompanied by his wife. He was recently seen as a hospital consultation by Drs. Hung and Pyrtle. He had small amounts of bright blood per rectum associated with diarrheal illness. Diarrhea abated. His wife relates that he had only 2 small episodes of bleeding and this has not recurred. At the time colonoscopy was deferred due to his other acute medical problems. Patient is being followed for congestive heart failure and atrial fibrillation on Coumadin. The patient and his wife are not interested in proceeding with colonoscopy this time. His last colonoscopy was performed in May 2000 revealed a small colon polyp and internal hemorrhoids.  Current Medications, Allergies, Past Medical History, Past Surgical History, Family History and Social History were reviewed in [employer] record.  Physical Exam: General: Well developed , well nourished, no acute distress Head: Normocephalic and atraumatic Eyes:  sclerae anicteric, EOMI Ears: Normal auditory acuity Mouth: No deformity or lesions Lungs: Clear throughout to auscultation Heart: Regular rate and rhythm; no murmurs, rubs or bruits Abdomen: Soft, non tender and non distended. No masses, hepatosplenomegaly or hernias noted. Normal Bowel sounds Musculoskeletal: Symmetrical with no gross deformities  Pulses:  Normal pulses noted Extremities: No clubbing, cyanosis, edema or deformities noted Neurological: Alert oriented x 4, grossly nonfocal Psychological:  Alert and cooperative. Normal mood and affect  Assessment and Recommendations:  1. Small-volume hematochezia associated with diarrhea. These symptoms resolved and have not recurred. I recommended proceeding with colonoscopy to further evaluate however the patient and his wife decline. They are more concerned about his atrial fibrillation, Coumadin management and congestive heart failure at this time. They state if he does  have recurrent episodes of bleeding they will return for followup.

## 2012-12-20 NOTE — Patient Instructions (Signed)
Diabetes and Standards of Medical Care  Diabetes is complicated. You may find that your diabetes team includes a dietitian, nurse, diabetes educator, eye doctor, and more. To help everyone know what is going on and to help you get the care you deserve, the following schedule of care was developed to help keep you on track. Below are the tests, exams, vaccines, medicines, education, and plans you will need. A1c test  Performed at least 2 times a year if you are meeting treatment goals.  Performed 4 times a year if therapy has changed or if you are not meeting therapy/glycemic goals. Aspirin medicine  Take daily as directed by your caregiver. Blood pressure test  Performed at every routine medical visit. The goal is less than 130/80 mm/Hg. Dental exam  Get a dental exam at least 2 times a year. Dilated eye exam (retinal exam)  Type 1 diabetes: Get an exam within 5 years of diagnosis and then yearly.  Type 2 diabetes: Get an exam at diagnosis and then yearly. All exams thereafter can be extended to every 2 to 3 years if one or more exams have been normal. Foot care exam  Visual foot exams are performed at every routine medical visit. The exams check for cuts, injuries, or other problems with the feet.  A comprehensive foot exam should be done yearly. This includes visual inspection as well as assessing foot pulses and testing for loss of sensation. Kidney function test (urine microalbumin)  Performed once a year.  Type 1 diabetes: The first test is performed 5 years after diagnosis.  Type 2 diabetes: The first test is performed at the time of diagnosis.  A serum creatinine and estimated glomerular filtration rate (eGFR) test is done once a year to tell the level of chronic kidney disease (CKD), if present. Lipid profile (Cholesterol, HDL, LDL, Triglycerides)  Performed once a year for most people. If at low risk, may be assessed every 2 years.  The goal for LDL is less than 100  mg/dl. If at high risk, the goal is less than 70 mg/dl.  The goal for HDL is higher than 40 mg/dl for men and higher than 50 mg/dl for women.  The goal for triglycerides is less than 150 mg/dl. Flu vaccine, pneumonia vaccine, and hepatitis B vaccine  The flu vaccine is recommended yearly.  The pneumonia vaccine is generally given once in a lifetime. However, there are some instances where another vaccine is recommended. Check with your caregiver.  The hepatitis B vaccine is also recommended for adults with diabetes. Diabetes self-management education  Recommended at diagnosis and ongoing as needed. Treatment plan  Reviewed at every medical visit. Document Released: 05/18/2009 Document Revised: 10/13/2011 Document Reviewed: 01/21/2011 ExitCare Patient Information 2013 [employer].  

## 2012-12-21 ENCOUNTER — Telehealth: Payer: Self-pay | Admitting: General Practice

## 2012-12-21 DIAGNOSIS — K219 Gastro-esophageal reflux disease without esophagitis: Secondary | ICD-10-CM

## 2012-12-21 LAB — BASIC METABOLIC PANEL
CO2: 30 mEq/L (ref 19–32)
Chloride: 105 mEq/L (ref 96–112)
Creatinine, Ser: 1.4 mg/dL (ref 0.4–1.5)

## 2012-12-21 LAB — POCT URINALYSIS DIPSTICK
Blood, UA: NEGATIVE
Ketones, UA: NEGATIVE
Protein, UA: NEGATIVE
Spec Grav, UA: 1.015
pH, UA: 6

## 2012-12-21 LAB — MICROALBUMIN / CREATININE URINE RATIO: Microalb, Ur: 4.7 mg/dL — ABNORMAL HIGH (ref 0.0–1.9)

## 2012-12-21 LAB — HEMOGLOBIN A1C: Hgb A1c MFr Bld: 6.8 % — ABNORMAL HIGH (ref 4.6–6.5)

## 2012-12-21 NOTE — Telephone Encounter (Signed)
Candise Che from [facility] called stating the pt needs a PA for Onglyza. Call them back at [phone] or [phone]

## 2012-12-22 ENCOUNTER — Other Ambulatory Visit: Payer: Self-pay | Admitting: *Deleted

## 2012-12-22 DIAGNOSIS — I10 Essential (primary) hypertension: Secondary | ICD-10-CM

## 2012-12-22 MED ORDER — BD SWAB SINGLE USE REGULAR PADS: MEDICATED_PAD | Status: DC

## 2012-12-22 MED ORDER — ACCU-CHEK AVIVA VI SOLN: Status: DC

## 2012-12-22 MED ORDER — OMEPRAZOLE 20 MG PO CPDR: 20.0000 mg | DELAYED_RELEASE_CAPSULE | Freq: Every day | ORAL | Status: DC

## 2012-12-22 MED ORDER — WARFARIN SODIUM 5 MG PO TABS: 5.0000 mg | ORAL_TABLET | Freq: Every day | ORAL | Status: DC

## 2012-12-22 MED ORDER — LOSARTAN POTASSIUM 25 MG PO TABS: 25.0000 mg | ORAL_TABLET | Freq: Every day | ORAL | Status: DC

## 2012-12-22 NOTE — Telephone Encounter (Signed)
Received a fax request for medications these were filled. [employer] contacted and PA form for Onglyza was faxed to our office. REF # [id number]

## 2012-12-22 NOTE — Telephone Encounter (Signed)
Meds filled

## 2012-12-24 ENCOUNTER — Encounter: Payer: Self-pay | Admitting: Family Medicine

## 2012-12-28 ENCOUNTER — Ambulatory Visit (INDEPENDENT_AMBULATORY_CARE_PROVIDER_SITE_OTHER): Payer: Medicare PPO | Admitting: *Deleted

## 2012-12-28 DIAGNOSIS — Z7901 Long term (current) use of anticoagulants: Secondary | ICD-10-CM

## 2012-12-28 DIAGNOSIS — I480 Paroxysmal atrial fibrillation: Secondary | ICD-10-CM

## 2012-12-28 DIAGNOSIS — I219 Acute myocardial infarction, unspecified: Secondary | ICD-10-CM

## 2012-12-28 DIAGNOSIS — I4891 Unspecified atrial fibrillation: Secondary | ICD-10-CM

## 2012-12-28 DIAGNOSIS — D696 Thrombocytopenia, unspecified: Secondary | ICD-10-CM

## 2012-12-28 LAB — POCT INR: INR: 3.4

## 2012-12-29 NOTE — Telephone Encounter (Signed)
Pt cant have metformin secondary to kidney disease

## 2012-12-29 NOTE — Telephone Encounter (Signed)
Received a Denial from PT insurance for Onglyza. States that pt has to be treated by some for of metformin or metformin containing combination product. Please advise.

## 2012-12-30 ENCOUNTER — Telehealth: Payer: Self-pay | Admitting: Pulmonary Disease

## 2012-12-30 ENCOUNTER — Encounter: Payer: Self-pay | Admitting: General Practice

## 2012-12-30 ENCOUNTER — Encounter (HOSPITAL_COMMUNITY): Payer: Self-pay | Admitting: *Deleted

## 2012-12-30 ENCOUNTER — Emergency Department (HOSPITAL_COMMUNITY)
Admission: EM | Admit: 2012-12-30 | Discharge: 2012-12-30 | Disposition: A | Discharge status: Home / Self Care | Payer: Medicare PPO | Attending: Emergency Medicine | Admitting: Emergency Medicine

## 2012-12-30 ENCOUNTER — Emergency Department (HOSPITAL_COMMUNITY): Payer: Medicare PPO

## 2012-12-30 ENCOUNTER — Telehealth: Payer: Self-pay | Admitting: Family Medicine

## 2012-12-30 DIAGNOSIS — R0789 Other chest pain: Secondary | ICD-10-CM | POA: Insufficient documentation

## 2012-12-30 DIAGNOSIS — I509 Heart failure, unspecified: Secondary | ICD-10-CM

## 2012-12-30 DIAGNOSIS — I10 Essential (primary) hypertension: Secondary | ICD-10-CM | POA: Insufficient documentation

## 2012-12-30 DIAGNOSIS — Z8709 Personal history of other diseases of the respiratory system: Secondary | ICD-10-CM | POA: Insufficient documentation

## 2012-12-30 DIAGNOSIS — Z87891 Personal history of nicotine dependence: Secondary | ICD-10-CM | POA: Insufficient documentation

## 2012-12-30 DIAGNOSIS — E785 Hyperlipidemia, unspecified: Secondary | ICD-10-CM | POA: Insufficient documentation

## 2012-12-30 DIAGNOSIS — I2510 Atherosclerotic heart disease of native coronary artery without angina pectoris: Secondary | ICD-10-CM | POA: Insufficient documentation

## 2012-12-30 DIAGNOSIS — Z8679 Personal history of other diseases of the circulatory system: Secondary | ICD-10-CM | POA: Insufficient documentation

## 2012-12-30 DIAGNOSIS — Z7901 Long term (current) use of anticoagulants: Secondary | ICD-10-CM | POA: Insufficient documentation

## 2012-12-30 DIAGNOSIS — G4730 Sleep apnea, unspecified: Secondary | ICD-10-CM | POA: Insufficient documentation

## 2012-12-30 DIAGNOSIS — J441 Chronic obstructive pulmonary disease with (acute) exacerbation: Secondary | ICD-10-CM | POA: Insufficient documentation

## 2012-12-30 DIAGNOSIS — I5042 Chronic combined systolic (congestive) and diastolic (congestive) heart failure: Secondary | ICD-10-CM | POA: Insufficient documentation

## 2012-12-30 DIAGNOSIS — I4891 Unspecified atrial fibrillation: Secondary | ICD-10-CM | POA: Insufficient documentation

## 2012-12-30 DIAGNOSIS — Z79899 Other long term (current) drug therapy: Secondary | ICD-10-CM | POA: Insufficient documentation

## 2012-12-30 LAB — CBC WITH DIFFERENTIAL/PLATELET
Eosinophils Absolute: 0.2 10*3/uL (ref 0.0–0.7)
Eosinophils Relative: 2 % (ref 0–5)
HCT: 37.1 % — ABNORMAL LOW (ref 39.0–52.0)
Hemoglobin: 11.4 g/dL — ABNORMAL LOW (ref 13.0–17.0)
Lymphs Abs: 2.1 10*3/uL (ref 0.7–4.0)
MCH: 26.5 pg (ref 26.0–34.0)
MCV: 86.3 fL (ref 78.0–100.0)
Monocytes Absolute: 0.8 10*3/uL (ref 0.1–1.0)
Monocytes Relative: 9 % (ref 3–12)
RBC: 4.3 MIL/uL (ref 4.22–5.81)

## 2012-12-30 LAB — COMPREHENSIVE METABOLIC PANEL
BUN: 30 mg/dL — ABNORMAL HIGH (ref 6–23)
Calcium: 9.6 mg/dL (ref 8.4–10.5)
GFR calc Af Amer: 67 mL/min — ABNORMAL LOW (ref >90)
GFR calc non Af Amer: 58 mL/min — ABNORMAL LOW (ref >90)
Glucose, Bld: 105 mg/dL — ABNORMAL HIGH (ref 70–99)
Total Protein: 7.4 g/dL (ref 6.0–8.3)

## 2012-12-30 LAB — PRO B NATRIURETIC PEPTIDE: Pro B Natriuretic peptide (BNP): 1870 pg/mL — ABNORMAL HIGH (ref 0–450)

## 2012-12-30 LAB — PROTIME-INR: INR: 2.1 — ABNORMAL HIGH (ref 0.00–1.49)

## 2012-12-30 MED ORDER — FUROSEMIDE 10 MG/ML IJ SOLN
40.0000 mg | Freq: Once | INTRAMUSCULAR | Status: AC
  Administered 2012-12-30: 40 mg via INTRAVENOUS
  Filled 2012-12-30: qty 4

## 2012-12-30 NOTE — ED Provider Notes (Signed)
History     CSN: [id number]  Arrival date & time 12/30/12  1722   First MD Initiated Contact with Patient 12/30/12 1840      Chief Complaint  Patient presents with  . Shortness of Breath    (Consider location/radiation/quality/duration/timing/severity/associated sxs/prior treatment) Patient is a 79 y.o. male presenting with shortness of breath. The history is provided by the patient and a relative.  Shortness of Breath He woke up at 3 AM with complaints of dyspnea. There is associated chest pain, heaviness, tightness, pressure. There is no associated nausea, vomiting, diaphoresis. There were no fever chills or cough. He has history of COPD, CHF, atrial fibrillation. Weight has been stable over the last week. INR was therapeutic 2 days ago. His dyspnea is worse with exertion. He does not lie flat so he cannot be sure whether he has orthopnea. He has been placed on oxygen and states he feels much better with the oxygen.  Past Medical History  Diagnosis Date  . Hypertension   . Hyperlipidemia   . Chronic combined systolic and diastolic CHF (congestive heart failure)     a. 10/2012 Echo: EF 40-45%, Gr1 dd, postlat AK, mild to mod AS, Triv AI, Mild MR, mildly dil LA.  . Sleep apnea   . Aortic aneurysm, abdominal     f/u by [facility]  . Pulmonary fibrosis   . Ischemic cardiomyopathy     a. EF 40-45% by echo 10/2012  . CAD (coronary artery disease)     a. s/p prior MI;  b. 11/1999 Cath: LM nl, LAD 92m, LCX min irregs, OM 100, RCA 80p, 100d, L->L and L->R collats, EF 54%.  Marland Kitchen COPD (chronic obstructive pulmonary disease)   . PAF (paroxysmal atrial fibrillation)     a. Dx 10/2012 during hosp for resp failure/copd flare  . Aortic stenosis     mild to mod by echo 10/2012 (mean gradient 14 mmHg)    Past Surgical History  Procedure Laterality Date  . Neck surgery    . Arm surgery    . Back surgery    . Leg surgery      Family History  Problem Relation Age of Onset  . Heart disease Mother      Heart Disease before age  78  . Other Mother     Rheumatism  . Heart attack Mother   . Heart attack Father     History  Substance Use Topics  . Smoking status: Former Smoker -- 0.80 packs/day for 78 years    Types: Cigarettes    Quit date: 08/05/2011  . Smokeless tobacco: Current User    Types: Chew    Last Attempt to Quit: 08/11/2011  . Alcohol Use: No      Review of Systems  Respiratory: Positive for shortness of breath.   All other systems reviewed and are negative.    Allergies  Ceftriaxone sodium in dextrose  Home Medications   Current Outpatient Rx  Name  Route  Sig  Dispense  Refill  . atorvastatin (LIPITOR) 40 MG tablet   Oral   Take 0.5 tablets (20 mg total) by mouth at bedtime.   45 tablet   3   . bisoprolol (ZEBETA) 5 MG tablet   Oral   Take 1 tablet (5 mg total) by mouth daily.   90 tablet   3   . budesonide-formoterol (SYMBICORT) 160-4.5 MCG/ACT inhaler   Inhalation   Inhale 2 puffs into the lungs 2 (two) times daily.  1 Inhaler   11   . cetirizine (ZYRTEC) 10 MG tablet   Oral   Take 10 mg by mouth daily.           . Coenzyme Q10 (CO Q 10 PO)   Oral   Take 1 tablet by mouth daily.          . Cyanocobalamin (VITAMIN B-12 PO)   Oral   Take 1 tablet by mouth daily.          Marland Kitchen diltiazem (CARDIZEM) 30 MG tablet   Oral   Take 1 tablet (30 mg total) by mouth 3 (three) times daily.   90 tablet   3   . docusate sodium (COLACE) 100 MG capsule   Oral   Take 100 mg by mouth daily.         . furosemide (LASIX) 40 MG tablet   Oral   Take 1 tablet (40 mg total) by mouth daily.   90 tablet   3   . Garlic (ODOR FREE GARLIC) 100 MG TABS   Oral   Take 1 tablet by mouth daily.           Marland Kitchen guaiFENesin (MUCINEX) 600 MG 12 hr tablet   Oral   Take 1,200 mg by mouth daily.          Marland Kitchen levalbuterol (XOPENEX) 0.63 MG/3ML nebulizer solution   Nebulization   Take 3 mLs (0.63 mg total) by nebulization every 2 (two) hours as needed  for wheezing or shortness of breath.   3 mL   11   . losartan (COZAAR) 25 MG tablet   Oral   Take 1 tablet (25 mg total) by mouth daily.   90 tablet   3   . Multiple Vitamin (MULITIVITAMIN WITH MINERALS) TABS   Oral   Take 1 tablet by mouth daily.         . Omega-3 Fatty Acids (FISH OIL PO)   Oral   Take 1 capsule by mouth daily.          Marland Kitchen omeprazole (PRILOSEC) 20 MG capsule   Oral   Take 1 capsule (20 mg total) by mouth daily.   90 capsule   1   . warfarin (COUMADIN) 5 MG tablet   Oral   Take 1 tablet (5 mg total) by mouth daily.   30 tablet   1     BP 121/89  Pulse 79  Temp(Src) 98.3 F (36.8 C)  Resp 22  SpO2 95%  Physical Exam  Nursing note and vitals reviewed.  79 year old male, resting comfortably and in no acute distress. Vital signs are significant for tachypnea with respiratory rate of 22. Oxygen saturation is 88%, which is hypoxic, but increased to 95% with nasal oxygen. Head is normocephalic and atraumatic. PERRLA, EOMI. Oropharynx is clear. Neck is nontender and supple without adenopathy or JVD. Back is nontender and there is no CVA tenderness. Lungs have rales at the left base. There are no wheezes or rhonchi. Chest is nontender. Heart has regular rate and rhythm without murmur. Abdomen is soft, flat, nontender without masses or hepatosplenomegaly and peristalsis is normoactive. Extremities have 1+ edema. Venous varicosities are noted. There is no cyanosis. Skin is warm and dry without rash. Neurologic: Mental status is normal, cranial nerves are intact, there are no motor or sensory deficits.  ED Course  Procedures (including critical care time)  Results for orders placed during the hospital encounter of 12/30/12  CBC WITH  DIFFERENTIAL      Result Value Range   WBC 9.1  4.0 - 10.5 K/uL   RBC 4.30  4.22 - 5.81 MIL/uL   Hemoglobin 11.4 (*) 13.0 - 17.0 g/dL   HCT 44.0 (*) 10.2 - 72.5 %   MCV 86.3  78.0 - 100.0 fL   MCH 26.5  26.0 - 34.0  pg   MCHC 30.7  30.0 - 36.0 g/dL   RDW 36.6 (*) 44.0 - 34.7 %   Platelets 174  150 - 400 K/uL   Neutrophils Relative % 66  43 - 77 %   Neutro Abs 6.0  1.7 - 7.7 K/uL   Lymphocytes Relative 23  12 - 46 %   Lymphs Abs 2.1  0.7 - 4.0 K/uL   Monocytes Relative 9  3 - 12 %   Monocytes Absolute 0.8  0.1 - 1.0 K/uL   Eosinophils Relative 2  0 - 5 %   Eosinophils Absolute 0.2  0.0 - 0.7 K/uL   Basophils Relative 0  0 - 1 %   Basophils Absolute 0.0  0.0 - 0.1 K/uL  COMPREHENSIVE METABOLIC PANEL      Result Value Range   Sodium 142  135 - 145 mEq/L   Potassium 4.0  3.5 - 5.1 mEq/L   Chloride 102  96 - 112 mEq/L   CO2 31  19 - 32 mEq/L   Glucose, Bld 105 (*) 70 - 99 mg/dL   BUN 30 (*) 6 - 23 mg/dL   Creatinine, Ser 4.25  0.50 - 1.35 mg/dL   Calcium 9.6  8.4 - 95.6 mg/dL   Total Protein 7.4  6.0 - 8.3 g/dL   Albumin 3.6  3.5 - 5.2 g/dL   AST 17  0 - 37 U/L   ALT 13  0 - 53 U/L   Alkaline Phosphatase 94  39 - 117 U/L   Total Bilirubin 0.4  0.3 - 1.2 mg/dL   GFR calc non Af Amer 58 (*) >90 mL/min   GFR calc Af Amer 67 (*) >90 mL/min  PRO B NATRIURETIC PEPTIDE      Result Value Range   Pro B Natriuretic peptide (BNP) 1870.0 (*) 0 - 450 pg/mL  PROTIME-INR      Result Value Range   Prothrombin Time 22.7 (*) 11.6 - 15.2 seconds   INR 2.10 (*) 0.00 - 1.49   Dg Chest 2 View  12/30/2012   *RADIOLOGY REPORT*  Clinical Data: Short of breath  CHEST - 2 VIEW  Comparison: 11/17/2012  Findings: Stable enlarged heart silhouette with ectatic aorta. There is chronic bronchitic markings and interstitial lung disease not changed from prior.  There is scarring at the right lung base unchanged.  No aggressive osseous lesions.  IMPRESSION:  1.  No interval change. 2.  Chronic bronchitic change and interstitial lung pattern. 3.  Chronic scarring at the right lung base. 4.  Ectatic aorta.   Original Report Authenticated By: Genevive Bi, M.D.      Date: 12/30/2012  Rate: 68  Rhythm: atrial  fibrillation  QRS Axis: normal  Intervals: normal  ST/T Wave abnormalities: nonspecific ST/T changes  Conduction Disutrbances:nonspecific intraventricular conduction delay  Narrative Interpretation: Atrial fibrillation, left ventricular hypertrophy with repolarization changes, nonspecific intraventricular conduction delay. When compared with ECG of 11/22/2012, no significant changes are seen.  Old EKG Reviewed: unchanged    1. CHF exacerbation       MDM  Acute dyspnea of uncertain cause. It seems unlikely  that his CHF would be at significantly worse given that his weight is stable. Screening labs have been ordered. He is very unlikely to have pulmonary embolism given that he is on chronic anticoagulation for his atrial fibrillation to   Laboratory testing shows significant elevation of BNP over baseline. BUN is elevated which also goes along with CHF. INR is therapeutic. He apparently has an exacerbation CHF in spite of the lack of change in weight. He is given a dose of furosemide in the emergency department. His family states that he has had problems with his daily starting to fail if he is diuresed too aggressively.  He had good diuresis with a single dose of furosemide. Given his problems with renal insufficiency in the past, and he will be referred back to his cardiologist to direct further adjustments of his diuretic dose. Patient remained hemodynamically stable throughout his stay in the emergency department.   Dione Booze, MD 12/30/12 2219

## 2012-12-30 NOTE — Telephone Encounter (Signed)
Patient Information:  Caller Name: Joe Jackson  Phone: [phone]  Patient: Joe Jackson, Joe Jackson  Gender: Male  DOB: 1930-02-19  Age: 79 Years  PCP: Lelon Perla.  Office Follow Up:  Does the office need to follow up with this patient?: Yes  Instructions For The Office: Suggest follow up call due to history and triage refused.  RN Note:  Called this morning to report Joe Jackson had a brething problem at 0300. Symptoms improved after two nebulizer treatments. Expressed concern "it took so long" for nurse call back. RN recommended triage of symtoms now, even though he has improved, due to history.  Declined triage since he no longer is symptomatic. Stated she would call back if symptoms recur. Reinforced if Joe Jackson has an emergent situation, to go to the ED instead of waiting for a call back. Verbalized understanding.  Symptoms  Reason For Call & Symptoms: Difficulty breathing at 0300.  Symptoms improved after two nebulizer treatments.  Declined triage since difficulty breathing is no longer present. History of COPD, CHF and A-Fib.  Stated will call back if needs assistance.  Reviewed Health History In EMR: N/A  Reviewed Medications In EMR: N/A  Reviewed Allergies In EMR: N/A  Reviewed Surgeries / Procedures: N/A  Date of Onset of Symptoms: 12/30/2012  Treatments Tried: Albuterol neb treatments  Treatments Tried Worked: Yes  Guideline(s) Used:  No Protocol Available - Information Only  Disposition Per Guideline:   Discuss with PCP and Callback by Nurse Today  Reason For Disposition Reached:   Nursing judgment  Advice Given:  N/A  Patient Will Follow Care Advice:  YES

## 2012-12-30 NOTE — Telephone Encounter (Signed)
Pt wife called back. Stated that she called CAN around 10:00am and she did not receive a call back until 2pm. Notified that per Dr. Laury Axon to please contact pulmonary to get pt evaluated due to provider not being in the office and having concern that he had to have two breathing treatments due to medical history.

## 2012-12-30 NOTE — ED Notes (Signed)
The pt just returned from xray alert wife at the bedside. The pt reports that he feels ok

## 2012-12-30 NOTE — ED Notes (Signed)
Pt reports SOB since 3 am.  Denies pain, denies CP.  Pt speaking in broken sentences.  Hx of COPD, states that pulse ox is generally 92%. BS diminished.

## 2012-12-30 NOTE — Telephone Encounter (Signed)
[name] ° °

## 2012-12-30 NOTE — Telephone Encounter (Signed)
PA appeal faxed today and [employer] notified.

## 2012-12-30 NOTE — ED Notes (Signed)
Denies cough 

## 2012-12-31 ENCOUNTER — Telehealth: Payer: Self-pay | Admitting: Cardiology

## 2012-12-31 ENCOUNTER — Ambulatory Visit: Payer: Medicare PPO | Admitting: Internal Medicine

## 2012-12-31 ENCOUNTER — Telehealth: Payer: Self-pay | Admitting: *Deleted

## 2012-12-31 ENCOUNTER — Other Ambulatory Visit: Payer: Self-pay | Admitting: General Practice

## 2012-12-31 DIAGNOSIS — I5042 Chronic combined systolic (congestive) and diastolic (congestive) heart failure: Secondary | ICD-10-CM

## 2012-12-31 DIAGNOSIS — E1159 Type 2 diabetes mellitus with other circulatory complications: Secondary | ICD-10-CM

## 2012-12-31 DIAGNOSIS — R0602 Shortness of breath: Secondary | ICD-10-CM

## 2012-12-31 MED ORDER — SAXAGLIPTIN HCL 2.5 MG PO TABS: 2.5000 mg | ORAL_TABLET | Freq: Every day | ORAL | Status: DC

## 2012-12-31 NOTE — Telephone Encounter (Signed)
Can continue Lasix 80 daily for now but need to see him next week and needs BMET/BNP on Monday or Tuesday.

## 2012-12-31 NOTE — Telephone Encounter (Signed)
Follow Up  Pt states she is returning your call.

## 2012-12-31 NOTE — Telephone Encounter (Signed)
New Prob      Calling in wanting to speak to nurse regarding blood work and adjusting LASIX prescription. Please call,.

## 2012-12-31 NOTE — Telephone Encounter (Signed)
Spoke with patient's wife. Pt will come for BMET/BNP 01/03/13 prior to 01/05/13 appt with Dr Shirlee Latch.

## 2012-12-31 NOTE — Telephone Encounter (Signed)
Attempted to contact pt to follow up after he went to the ED last night but vm has not ben set up, will try again later.

## 2012-12-31 NOTE — Telephone Encounter (Signed)
LMTCB

## 2012-12-31 NOTE — Telephone Encounter (Signed)
Spoke with pt's wife. Pt went to ED last night. Pt was given IV lasix at ED. Today pt's weight is down 5 pounds from yesterday.  Pt was told to double lasix from 40mg  daily to 80mg  daily. Pt's SOB is much better. Pt has an appt with Dr Shirlee Latch 01/05/13.  Pt's wife just wants to be sure this is what Dr Shirlee Latch wants him to do based on BMET/BNP, etc.

## 2012-12-31 NOTE — Telephone Encounter (Signed)
Received approval from [facility] for pt Onglyza from 12-30-12 until 01-01-2015. Pt notified, fax sent to pharmacy and to be scanned into pt chart.

## 2013-01-03 ENCOUNTER — Other Ambulatory Visit (INDEPENDENT_AMBULATORY_CARE_PROVIDER_SITE_OTHER): Payer: Medicare PPO

## 2013-01-03 DIAGNOSIS — R0602 Shortness of breath: Secondary | ICD-10-CM

## 2013-01-03 DIAGNOSIS — I5042 Chronic combined systolic (congestive) and diastolic (congestive) heart failure: Secondary | ICD-10-CM

## 2013-01-03 LAB — BASIC METABOLIC PANEL
Calcium: 9.3 mg/dL (ref 8.4–10.5)
Creatinine, Ser: 1.4 mg/dL (ref 0.4–1.5)
GFR: 50.41 mL/min — ABNORMAL LOW (ref >60.00)
Glucose, Bld: 110 mg/dL — ABNORMAL HIGH (ref 70–99)
Sodium: 144 mEq/L (ref 135–145)

## 2013-01-03 NOTE — Telephone Encounter (Signed)
Called pt back to follow up after ED visit. Patients wife stated that in the hospital he was in fluid overload and they "gave him some medicine that tool about 5 # off of him. Patient has a f/u appt with cards on Wed to look at adjusting lasix. Advised onglyza was approved but pt states he may not take it because his sugars drop too low. Patient stated that each time he has his blood drawn, he has already eaten prior to visit.

## 2013-01-05 ENCOUNTER — Encounter: Payer: Self-pay | Admitting: Cardiology

## 2013-01-05 ENCOUNTER — Encounter: Payer: Self-pay | Admitting: *Deleted

## 2013-01-05 ENCOUNTER — Ambulatory Visit (INDEPENDENT_AMBULATORY_CARE_PROVIDER_SITE_OTHER): Payer: Medicare PPO | Admitting: Cardiology

## 2013-01-05 ENCOUNTER — Ambulatory Visit (INDEPENDENT_AMBULATORY_CARE_PROVIDER_SITE_OTHER): Payer: Medicare PPO | Admitting: *Deleted

## 2013-01-05 VITALS — BP 104/50 | HR 75 | Ht 66.0 in | Wt 201.0 lb

## 2013-01-05 DIAGNOSIS — I10 Essential (primary) hypertension: Secondary | ICD-10-CM

## 2013-01-05 DIAGNOSIS — I4891 Unspecified atrial fibrillation: Secondary | ICD-10-CM

## 2013-01-05 DIAGNOSIS — E785 Hyperlipidemia, unspecified: Secondary | ICD-10-CM

## 2013-01-05 DIAGNOSIS — I5023 Acute on chronic systolic (congestive) heart failure: Secondary | ICD-10-CM

## 2013-01-05 DIAGNOSIS — D696 Thrombocytopenia, unspecified: Secondary | ICD-10-CM

## 2013-01-05 DIAGNOSIS — I219 Acute myocardial infarction, unspecified: Secondary | ICD-10-CM

## 2013-01-05 DIAGNOSIS — I4819 Other persistent atrial fibrillation: Secondary | ICD-10-CM

## 2013-01-05 DIAGNOSIS — I480 Paroxysmal atrial fibrillation: Secondary | ICD-10-CM

## 2013-01-05 DIAGNOSIS — Z7901 Long term (current) use of anticoagulants: Secondary | ICD-10-CM

## 2013-01-05 DIAGNOSIS — I5042 Chronic combined systolic (congestive) and diastolic (congestive) heart failure: Secondary | ICD-10-CM

## 2013-01-05 DIAGNOSIS — I509 Heart failure, unspecified: Secondary | ICD-10-CM

## 2013-01-05 DIAGNOSIS — I2510 Atherosclerotic heart disease of native coronary artery without angina pectoris: Secondary | ICD-10-CM

## 2013-01-05 LAB — POCT INR: INR: 2.7

## 2013-01-05 MED ORDER — LOSARTAN POTASSIUM 25 MG PO TABS: 25.0000 mg | ORAL_TABLET | Freq: Every day | ORAL | Status: DC

## 2013-01-05 MED ORDER — FUROSEMIDE 40 MG PO TABS: ORAL_TABLET | ORAL | Status: DC

## 2013-01-05 MED ORDER — BISOPROLOL FUMARATE 10 MG PO TABS: 10.0000 mg | ORAL_TABLET | Freq: Every day | ORAL | Status: DC

## 2013-01-05 NOTE — Patient Instructions (Addendum)
Stop Diltiazem (cardizem) .  Increase Bisoprolol to 10mg  daily. You can take 2 of your 5mg  tablets daily at the same time and use your current supply.  Your physician recommends that you return for a FASTING lipid profile /BMET/BNP in 1 week when you have your protime. This is scheduled for Wednesday June 11,2014. You should not eat or drink for at least 8 hours before you have this lab.  Have your protime checked weekly. If your weekly protimes are OK we will plan on a cardioversion after June  19,2014. This is scheduled for Monday January 24, 2013. See instructions given to you today.   Your physician recommends that you schedule a follow-up appointment with Dr Shirlee Latch 1 week after your cardioversion. This is scheduled for Thursday July 3,2014 at 2 PM.

## 2013-01-06 DIAGNOSIS — I4819 Other persistent atrial fibrillation: Secondary | ICD-10-CM | POA: Insufficient documentation

## 2013-01-06 NOTE — Progress Notes (Signed)
Patient ID: Joe Jackson, male   DOB: Oct 09, 1929, 79 y.o.   MRN: [id number] PCP: Dr. Laury Axon  79 yo with history of CAD, ischemic cardiomyopathy, and atrial fibrillation presents for cardiology followup.  He has been seen by Dr. Riley Kill in the past and is seen by me for the first time today.  I have reviewed all his old records.  Patient has had medically managed CAD since cath in 2001 showed occluded OM and distal RCA.  He has an ischemic cardiomyopathy with last EF 40-45% in 3/14.  He was admitted in 3/14 with new-onset atrial fibrillation with RVR in the setting of PNA.  He was diuresed and PNA was treated.  After discharge, he came back again in atrial fibrillation with RVR and CHF.  Again, he was diuresed and discharged.  He was not cardioverted.  He has been on warfarin, INR has been therapeutic since 5/19.   He was in the ER about a week ago with dyspnea, and Lasix was increased to 80 mg daily.   Since increasing Lasix, his breathing has improved.  He is not short of breath walking around his house but does get short of breath after walking about 50 yards.  Prior to his 3/14 admission, he really was not getting short of breath with exertion, so overall he is still considerably worse than his baseline. He remains in atrial fibrillation today.  No chest pain.  Weight is down 3 lbs since last appointment.    ECG: atrial fibrillation at 81, LVH, old inferior MI  Labs (5/14): BNP 1870 Labs (6/14): K 3.8, creatinine 1.4, BNP 266  PMH: 1. CAD: s/p MI 2001. LHC showing total occlusion of OM and distal RCA with L=>L and L=>R collaterals.   2. Ischemic cardiomyopathy: Echo (3/14) with EF 40-45%, posterior akinesis, mild LV dilation, mild to moderate AS (mean gradient 14 mmHg), mild MR.   3. AAA: Followed yearly by Dr. Edilia Bo. 4. Pulmonary fibrosis: Followed by Dr. Vassie Loll 5. COPD 6. HTN: ACEI cough. 7. Hyperlipidemia 8. Atrial fibrillation: Chronic.  He was noted to have atrial fibrillation with RVR in  3/14 in the setting of PNA.  He was readmitted in 4/14 still in atrial fibrillation.   9. Aortic stenosis: Mild-moderate on 3/14 echo.  10. ITP 11. History of hemorrhoidal bleeding.   12. CKD  SH: Married, prior heavy tobacco.   FH: CAD  ROS: All systems reviewed and negative except as listed in the HPI.   Current Outpatient Prescriptions  Medication Sig Dispense Refill  . atorvastatin (LIPITOR) 40 MG tablet Take 0.5 tablets (20 mg total) by mouth at bedtime.  45 tablet  3  . budesonide-formoterol (SYMBICORT) 160-4.5 MCG/ACT inhaler Inhale 2 puffs into the lungs 2 (two) times daily.  1 Inhaler  11  . cetirizine (ZYRTEC) 10 MG tablet Take 10 mg by mouth daily.        . Coenzyme Q10 (CO Q 10 PO) Take 1 tablet by mouth daily.       . Cyanocobalamin (VITAMIN B-12 PO) Take 1 tablet by mouth daily.       Marland Kitchen docusate sodium (COLACE) 100 MG capsule Take 100 mg by mouth daily.      . furosemide (LASIX) 40 MG tablet 2 tablets (total 80mg ) daily  180 tablet  3  . Garlic (ODOR FREE GARLIC) 100 MG TABS Take 1 tablet by mouth daily.        Marland Kitchen guaiFENesin (MUCINEX) 600 MG 12 hr tablet Take 1,200  mg by mouth daily.       Marland Kitchen levalbuterol (XOPENEX) 0.63 MG/3ML nebulizer solution Take 3 mLs (0.63 mg total) by nebulization every 2 (two) hours as needed for wheezing or shortness of breath.  3 mL  11  . losartan (COZAAR) 25 MG tablet Take 1 tablet (25 mg total) by mouth daily.  90 tablet  3  . Multiple Vitamin (MULITIVITAMIN WITH MINERALS) TABS Take 1 tablet by mouth daily.      . Omega-3 Fatty Acids (FISH OIL PO) Take 1 capsule by mouth daily.       Marland Kitchen omeprazole (PRILOSEC) 20 MG capsule Take 1 capsule (20 mg total) by mouth daily.  90 capsule  1  . saxagliptin HCl (ONGLYZA) 2.5 MG TABS tablet Take 1 tablet (2.5 mg total) by mouth daily.  30 tablet  2  . warfarin (COUMADIN) 5 MG tablet Take 1 tablet (5 mg total) by mouth daily.  30 tablet  1  . bisoprolol (ZEBETA) 10 MG tablet Take 1 tablet (10 mg total) by  mouth daily.  90 tablet  3   No current facility-administered medications for this visit.    BP 104/50  Pulse 75  Ht 5\' 6"  (1.676 m)  Wt 201 lb (91.173 kg)  BMI 32.46 kg/m2  SpO2 96% General: NAD Neck: JVP 7 cm, no thyromegaly or thyroid nodule.  Lungs: Slight crackles at bases. CV: Nondisplaced PMI.  Heart irregular S1/S2, no S3/S4, 3/6 SEM RUSB with S2 heard clearly.  1+ edema 1/2 up bilaterally.  No carotid bruit.  Normal pedal pulses.  Abdomen: Soft, nontender, no hepatosplenomegaly, no distention.  Neurologic: Alert and oriented x 3.  Psych: Normal affect. Extremities: No clubbing or cyanosis.   Assessment/Plan: 1. Atrial fibrillation: New since 3/14.  I think that this may be a trigger for worsening of CHF.  His INR has been therapeutic since 5/19.  If he remains therapeutic, will plan DCCV after 6/19.  Continue warfarin.  Given decreased EF, I am going to have him stop diltiazem and increase bisoprolol to 10 mg daily for rate control.   2. Chronic systolic CHF: EF 98-11% on last echo.  Recently increased Lasix to 80 mg daily.  Symptomatically, he is better, but still NYHA class III.  He does not appear significantly volume overloaded on exam. - Continue lasix at 80 mg daily - Will check BMET/BNP in 1 week.  - As above, increase bisoprolol to 10 mg daily and continue losartan.   3. CAD: Stable CAD so on warfarin without ASA.  He will also continue statin.  4. Hyperlipidemia: On atorvastatin.  Check lipids in 1 week with BMET.  5. CKD: Follow BMET closely.  6. AAA: Followed at [facility]. 7. Followup 1 week after DCCV.   Marca Ancona 01/06/2013 12:04 PM

## 2013-01-07 NOTE — Addendum Note (Signed)
Addended by: Micki Riley C on: 01/07/2013 10:03 AM   Modules accepted: Orders

## 2013-01-12 ENCOUNTER — Other Ambulatory Visit (INDEPENDENT_AMBULATORY_CARE_PROVIDER_SITE_OTHER): Payer: Medicare PPO

## 2013-01-12 ENCOUNTER — Other Ambulatory Visit: Payer: Medicare PPO

## 2013-01-12 ENCOUNTER — Other Ambulatory Visit: Payer: Self-pay | Admitting: *Deleted

## 2013-01-12 ENCOUNTER — Ambulatory Visit (INDEPENDENT_AMBULATORY_CARE_PROVIDER_SITE_OTHER): Payer: Medicare PPO | Admitting: *Deleted

## 2013-01-12 DIAGNOSIS — I4891 Unspecified atrial fibrillation: Secondary | ICD-10-CM

## 2013-01-12 DIAGNOSIS — I5042 Chronic combined systolic (congestive) and diastolic (congestive) heart failure: Secondary | ICD-10-CM

## 2013-01-12 DIAGNOSIS — D696 Thrombocytopenia, unspecified: Secondary | ICD-10-CM

## 2013-01-12 DIAGNOSIS — Z7901 Long term (current) use of anticoagulants: Secondary | ICD-10-CM

## 2013-01-12 DIAGNOSIS — I219 Acute myocardial infarction, unspecified: Secondary | ICD-10-CM

## 2013-01-12 DIAGNOSIS — I10 Essential (primary) hypertension: Secondary | ICD-10-CM

## 2013-01-12 DIAGNOSIS — I509 Heart failure, unspecified: Secondary | ICD-10-CM

## 2013-01-12 DIAGNOSIS — I480 Paroxysmal atrial fibrillation: Secondary | ICD-10-CM

## 2013-01-12 LAB — BASIC METABOLIC PANEL
BUN: 41 mg/dL — ABNORMAL HIGH (ref 6–23)
CO2: 33 mEq/L — ABNORMAL HIGH (ref 19–32)
Calcium: 9.2 mg/dL (ref 8.4–10.5)
Creatinine, Ser: 1.7 mg/dL — ABNORMAL HIGH (ref 0.4–1.5)

## 2013-01-12 LAB — POCT INR: INR: 2.9

## 2013-01-12 LAB — LIPID PANEL
Cholesterol: 147 mg/dL (ref 0–200)
LDL Cholesterol: 87 mg/dL (ref 0–99)

## 2013-01-19 ENCOUNTER — Ambulatory Visit (INDEPENDENT_AMBULATORY_CARE_PROVIDER_SITE_OTHER): Payer: Medicare PPO | Admitting: *Deleted

## 2013-01-19 ENCOUNTER — Encounter: Payer: Self-pay | Admitting: Physician Assistant

## 2013-01-19 ENCOUNTER — Telehealth: Payer: Self-pay | Admitting: Cardiology

## 2013-01-19 ENCOUNTER — Encounter: Payer: Self-pay | Admitting: *Deleted

## 2013-01-19 ENCOUNTER — Ambulatory Visit (INDEPENDENT_AMBULATORY_CARE_PROVIDER_SITE_OTHER): Payer: Medicare PPO | Admitting: Physician Assistant

## 2013-01-19 VITALS — BP 118/64 | HR 106 | Ht 66.0 in | Wt 203.0 lb

## 2013-01-19 DIAGNOSIS — I4891 Unspecified atrial fibrillation: Secondary | ICD-10-CM

## 2013-01-19 DIAGNOSIS — I480 Paroxysmal atrial fibrillation: Secondary | ICD-10-CM

## 2013-01-19 DIAGNOSIS — D696 Thrombocytopenia, unspecified: Secondary | ICD-10-CM

## 2013-01-19 DIAGNOSIS — I219 Acute myocardial infarction, unspecified: Secondary | ICD-10-CM

## 2013-01-19 DIAGNOSIS — Z7901 Long term (current) use of anticoagulants: Secondary | ICD-10-CM

## 2013-01-19 LAB — BASIC METABOLIC PANEL
Calcium: 9.1 mg/dL (ref 8.4–10.5)
GFR: 50.82 mL/min — ABNORMAL LOW (ref >60.00)
Glucose, Bld: 90 mg/dL (ref 70–99)
Potassium: 4 mEq/L (ref 3.5–5.1)
Sodium: 137 mEq/L (ref 135–145)

## 2013-01-19 LAB — POCT INR: INR: 2.7

## 2013-01-19 NOTE — Progress Notes (Signed)
[address], [address] [place], [place]  [address] Phone: [phone] Fax:  [phone]  Date:  01/19/2013   ID:  Joe Jackson, DOB [birth year], MRN [id number]  PCP:  Loreen Freud, DO  Primary Cardiologist:  Dr.  Shawnie Pons => Dr. Marca Ancona    History of Present Illness: Joe Jackson is a 79 y.o. male who is added on to my schedule for dyspnea.   He has a hx of CAD, s/p MI in 2001, combined systolic and diastolic CHF (EF 69% in the past), AAA (followed by Dr. Edilia Bo), pulmonary fibrosis (Dr. Vassie Loll), COPD, atrial fibrillation, HTN, HL.  Last cath in 2001 demonstrated an occluded OM and diffusely diseased RCA with distal occlusion and L->L and L->R collats.  He was seen by cardiology during an admission in 3/14 with AFib with RVR in the setting of a/c respiratory failure/pneumonia.  He was started on rate control therapy with diltiazem. He converted to NSR. He was placed on warfarin.  Echo 09/25/12: EF 40-45%, posterior lateral AK, grade 1 diastolic dysfunction, mild to moderate aortic stenosis (mean gradient 14), MAC, mild MR, mild LAE.  He was readmitted 4/11-4/17 with a/c respiratory failure secondary to acute on chronic combined systolic and diastolic CHF in the setting of possible healthcare associated pneumonia and recurrent AFib with RVR. He has hx of a low-grade chronic immune thrombocytopenia.  He has seen GI for recent BRBPR.  Colonoscopy was recommended but patient declined for now.   I had seen him recently with volume overload.  He had marginal improvement in his symptoms with increase dosages of Lasix.  He established with Dr. Shirlee Latch in May. When last seen 01/05/13, it was felt that atrial fibrillation may the trigger for worsening CHF. His breathing was improved at that time. His diltiazem was stopped and his bisoprolol was increased. His INRs have been therapeutic. He is set up for DCCV Monday 6/23.    His wife notes that he seems to be more SOB.  He notes increased  dyspnea with bending over.  He woke up to go to the BR last night.  He could not get back to sleep due to dyspnea.  No chest pain.  No syncope.  No orthopnea, PND.  Edema is much improved.    Labs (4/14):  K 4.9, Cr 1.24=>1.6=>1.5, ALT 82, Hgb 11.7=>10.8, PLT 79, TSH 0.772 Labs (5/14): BNP 1870  Labs (6/14): K 3.8, creatinine 1.4=>1.7, BNP 266, LDL 87  INR  Date/Time Value Range Status  01/19/2013 12:23 PM 2.7   Final  01/12/2013  7:54 AM 2.9   Final  01/05/2013  2:27 PM 2.7   Final  12/30/2012  6:56 PM 2.10* 0.00 - 1.49 Final  12/28/2012  4:23 PM 3.4   Final  12/20/2012 10:21 AM 2.0   Final  12/13/2012  4:37 PM 1.6   Final  12/06/2012 10:07 AM 1.9   Final  11/18/2012  4:25 AM 1.24  0.00 - 1.49 Final  11/17/2012  4:25 AM 1.17  0.00 - 1.49 Final  11/16/2012  4:35 AM 1.13  0.00 - 1.49 Final  11/15/2012 10:00 AM 1.20  0.00 - 1.49 Final  11/15/2012  6:10 AM 1.22  0.00 - 1.49 Final  11/14/2012  4:50 AM 2.47* 0.00 - 1.49 Final    Wt Readings from Last 3 Encounters:  01/19/13 203 lb (92.08 kg)  01/05/13 201 lb (91.173 kg)  12/20/12 203 lb (92.08 kg)     Past Medical History  Diagnosis Date  . Hypertension   . Hyperlipidemia   . Chronic combined systolic and diastolic CHF (congestive heart failure)     a. 10/2012 Echo: EF 40-45%, Gr1 dd, postlat AK, mild to mod AS, Triv AI, Mild MR, mildly dil LA.  . Sleep apnea   . Aortic aneurysm, abdominal     f/u by [facility]  . Pulmonary fibrosis   . Ischemic cardiomyopathy     a. EF 40-45% by echo 10/2012  . CAD (coronary artery disease)     a. s/p prior MI;  b. 11/1999 Cath: LM nl, LAD 87m, LCX min irregs, OM 100, RCA 80p, 100d, L->L and L->R collats, EF 54%.  Marland Kitchen COPD (chronic obstructive pulmonary disease)   . PAF (paroxysmal atrial fibrillation)     a. Dx 10/2012 during hosp for resp failure/copd flare  . Aortic stenosis     mild to mod by echo 10/2012 (mean gradient 14 mmHg)    Current Outpatient Prescriptions  Medication Sig Dispense Refill  .  atorvastatin (LIPITOR) 40 MG tablet Take 0.5 tablets (20 mg total) by mouth at bedtime.  45 tablet  3  . bisoprolol (ZEBETA) 10 MG tablet Take 1 tablet (10 mg total) by mouth daily.  90 tablet  3  . budesonide-formoterol (SYMBICORT) 160-4.5 MCG/ACT inhaler Inhale 2 puffs into the lungs 2 (two) times daily.  1 Inhaler  11  . cetirizine (ZYRTEC) 10 MG tablet Take 10 mg by mouth daily.        . Coenzyme Q10 (CO Q 10 PO) Take 1 tablet by mouth daily.       . Cyanocobalamin (VITAMIN B-12 PO) Take 1 tablet by mouth daily.       Marland Kitchen docusate sodium (COLACE) 100 MG capsule Take 100 mg by mouth daily.      . furosemide (LASIX) 40 MG tablet 2 tablets (total 80mg ) daily  180 tablet  3  . Garlic (ODOR FREE GARLIC) 100 MG TABS Take 1 tablet by mouth daily.        Marland Kitchen guaiFENesin (MUCINEX) 600 MG 12 hr tablet Take 1,200 mg by mouth daily.       Marland Kitchen levalbuterol (XOPENEX) 0.63 MG/3ML nebulizer solution Take 3 mLs (0.63 mg total) by nebulization every 2 (two) hours as needed for wheezing or shortness of breath.  3 mL  11  . losartan (COZAAR) 25 MG tablet Take 1 tablet (25 mg total) by mouth daily.  90 tablet  3  . Multiple Vitamin (MULITIVITAMIN WITH MINERALS) TABS Take 1 tablet by mouth daily.      . Omega-3 Fatty Acids (FISH OIL PO) Take 1 capsule by mouth daily.       Marland Kitchen omeprazole (PRILOSEC) 20 MG capsule Take 1 capsule (20 mg total) by mouth daily.  90 capsule  1  . saxagliptin HCl (ONGLYZA) 2.5 MG TABS tablet Take 1 tablet (2.5 mg total) by mouth daily.  30 tablet  2  . warfarin (COUMADIN) 5 MG tablet Take 1 tablet (5 mg total) by mouth daily.  30 tablet  1   No current facility-administered medications for this visit.    Allergies:    Allergies  Allergen Reactions  . Ceftriaxone Sodium In Dextrose Swelling and Other (See Comments)    Causes lip swelling and redness    Social History:  The patient  reports that he quit smoking about 17 months ago. His smoking use included Cigarettes. He has a 62.4  pack-year smoking history. His smokeless tobacco use  includes Chew. He reports that he does not drink alcohol or use illicit drugs.   ROS:  Please see the history of present illness.   Denies cough, melena, hematochezia.   All other systems reviewed and negative.   PHYSICAL EXAM: VS:  BP 118/64  Pulse 106  Ht 5\' 6"  (1.676 m)  Wt 203 lb (92.08 kg)  BMI 32.78 kg/m2 Well nourished, well developed, in no acute distress HEENT: normal Neck: no JVD at 90 degrees Cardiac:  normal S1, S2; irregularly irregular rhythm; 2/6 systolic murmur heard best at the RUSB Lungs:  Decreased breath sounds bilaterally, dry crackles at the bases, no wheezing Abd: soft, nontender  Ext: no edema  Skin: warm and dry Neuro:  CNs 2-12 intact, no focal abnormalities noted  EKG:  AFib, HR 106, no change from prior tracings     ASSESSMENT AND PLAN:  1. Atrial Fibrillation:  Rate increased.  He may be more symptomatic with this.  His INRs have been therapeutic for 5 weeks.  I have recommended proceeding with DCCV tomorrow.  He is agreeable to this.  D/w Dr. Cassell Clement (DOD) who agrees.  Patient will be set up for DCCV tomorrow.   2. Chronic Combined Systolic and Diastolic CHF:   His volume appears stable.  He is due for repeat BMET today. 3. CAD:  No angina.  No ASA as he is on coumadin.  Continue statin. 4. Aortic Stenosis:  Mild to moderate by recent echo.  He will need periodic echocardiograms to follow this. 5. Hypertension:  Controlled.  Continue current therapy.  6. Hyperlipidemia:  Continue statin.    7. Thrombocytopenia:  Follow up with PCP and hematology prn. 8. COPD:  F/u with pulmonology as planned. 9. Disposition:  F/u with Dr. Marca Ancona as planned.  Luna Glasgow, PA-C  12:45 PM 01/19/2013

## 2013-01-19 NOTE — Telephone Encounter (Signed)
Spoke with patient's wife.  Pt more short of breath since last night.  I have given pt appt with Joe Jackson today.

## 2013-01-19 NOTE — Patient Instructions (Addendum)
CARDIOVERSION 01/20/13 12 PM; DCCV RESCHEDULED FROM 01/24/13 WITH DR. Shirlee Latch TO 01/20/13 WITH DR. Patty Sermons PER SCOTT WEAVER, PAC  LAB TODAY; BMET  COUMADIN CHECK 01/26/13 @ 4:30

## 2013-01-19 NOTE — H&P (Signed)
History and Physical  Date:  01/19/2013   ID:  Joe Jackson, DOB 1929/09/04, MRN [id number]  PCP:  Loreen Freud, DO  Primary Cardiologist:  Dr.  Shawnie Pons => Dr. Marca Ancona    History of Present Illness: Joe Jackson is a 79 y.o. male who is added on to my schedule for dyspnea.   He has a hx of CAD, s/p MI in 2001, combined systolic and diastolic CHF (EF 29% in the past), AAA (followed by Dr. Edilia Bo), pulmonary fibrosis (Dr. Vassie Loll), COPD, atrial fibrillation, HTN, HL.  Last cath in 2001 demonstrated an occluded OM and diffusely diseased RCA with distal occlusion and L->L and L->R collats.  He was seen by cardiology during an admission in 3/14 with AFib with RVR in the setting of a/c respiratory failure/pneumonia.  He was started on rate control therapy with diltiazem. He converted to NSR. He was placed on warfarin.  Echo 09/25/12: EF 40-45%, posterior lateral AK, grade 1 diastolic dysfunction, mild to moderate aortic stenosis (mean gradient 14), MAC, mild MR, mild LAE.  He was readmitted 4/11-4/17 with a/c respiratory failure secondary to acute on chronic combined systolic and diastolic CHF in the setting of possible healthcare associated pneumonia and recurrent AFib with RVR. He has hx of a low-grade chronic immune thrombocytopenia.  He has seen GI for recent BRBPR.  Colonoscopy was recommended but patient declined for now.   I had seen him recently with volume overload.  He had marginal improvement in his symptoms with increase dosages of Lasix.  He established with Dr. Shirlee Latch in May. When last seen 01/05/13, it was felt that atrial fibrillation may the trigger for worsening CHF. His breathing was improved at that time. His diltiazem was stopped and his bisoprolol was increased. His INRs have been therapeutic. He is set up for DCCV Monday 6/23.    His wife notes that he seems to be more SOB.  He notes increased dyspnea with bending over.  He woke up to go to the BR last night.  He could not get  back to sleep due to dyspnea.  No chest pain.  No syncope.  No orthopnea, PND.  Edema is much improved.    Labs (4/14):  K 4.9, Cr 1.24=>1.6=>1.5, ALT 82, Hgb 11.7=>10.8, PLT 79, TSH 0.772 Labs (5/14): BNP 1870  Labs (6/14): K 3.8, creatinine 1.4=>1.7, BNP 266, LDL 87  INR  Date/Time Value Range Status  01/19/2013 12:23 PM 2.7   Final  01/12/2013  7:54 AM 2.9   Final  01/05/2013  2:27 PM 2.7   Final  12/30/2012  6:56 PM 2.10* 0.00 - 1.49 Final  12/28/2012  4:23 PM 3.4   Final  12/20/2012 10:21 AM 2.0   Final  12/13/2012  4:37 PM 1.6   Final  12/06/2012 10:07 AM 1.9   Final  11/18/2012  4:25 AM 1.24  0.00 - 1.49 Final  11/17/2012  4:25 AM 1.17  0.00 - 1.49 Final  11/16/2012  4:35 AM 1.13  0.00 - 1.49 Final  11/15/2012 10:00 AM 1.20  0.00 - 1.49 Final  11/15/2012  6:10 AM 1.22  0.00 - 1.49 Final  11/14/2012  4:50 AM 2.47* 0.00 - 1.49 Final    Wt Readings from Last 3 Encounters:  01/19/13 203 lb (92.08 kg)  01/05/13 201 lb (91.173 kg)  12/20/12 203 lb (92.08 kg)     Past Medical History  Diagnosis Date  . Hypertension   . Hyperlipidemia   . Chronic combined  systolic and diastolic CHF (congestive heart failure)     a. 10/2012 Echo: EF 40-45%, Gr1 dd, postlat AK, mild to mod AS, Triv AI, Mild MR, mildly dil LA.  . Sleep apnea   . Aortic aneurysm, abdominal     f/u by [facility]  . Pulmonary fibrosis   . Ischemic cardiomyopathy     a. EF 40-45% by echo 10/2012  . CAD (coronary artery disease)     a. s/p prior MI;  b. 11/1999 Cath: LM nl, LAD 37m, LCX min irregs, OM 100, RCA 80p, 100d, L->L and L->R collats, EF 54%.  Marland Kitchen COPD (chronic obstructive pulmonary disease)   . PAF (paroxysmal atrial fibrillation)     a. Dx 10/2012 during hosp for resp failure/copd flare  . Aortic stenosis     mild to mod by echo 10/2012 (mean gradient 14 mmHg)    Current Outpatient Prescriptions  Medication Sig Dispense Refill  . atorvastatin (LIPITOR) 40 MG tablet Take 0.5 tablets (20 mg total) by mouth at bedtime.  45  tablet  3  . bisoprolol (ZEBETA) 10 MG tablet Take 1 tablet (10 mg total) by mouth daily.  90 tablet  3  . budesonide-formoterol (SYMBICORT) 160-4.5 MCG/ACT inhaler Inhale 2 puffs into the lungs 2 (two) times daily.  1 Inhaler  11  . cetirizine (ZYRTEC) 10 MG tablet Take 10 mg by mouth daily.        . Coenzyme Q10 (CO Q 10 PO) Take 1 tablet by mouth daily.       . Cyanocobalamin (VITAMIN B-12 PO) Take 1 tablet by mouth daily.       Marland Kitchen docusate sodium (COLACE) 100 MG capsule Take 100 mg by mouth daily.      . furosemide (LASIX) 40 MG tablet 2 tablets (total 80mg ) daily  180 tablet  3  . Garlic (ODOR FREE GARLIC) 100 MG TABS Take 1 tablet by mouth daily.        Marland Kitchen guaiFENesin (MUCINEX) 600 MG 12 hr tablet Take 1,200 mg by mouth daily.       Marland Kitchen levalbuterol (XOPENEX) 0.63 MG/3ML nebulizer solution Take 3 mLs (0.63 mg total) by nebulization every 2 (two) hours as needed for wheezing or shortness of breath.  3 mL  11  . losartan (COZAAR) 25 MG tablet Take 1 tablet (25 mg total) by mouth daily.  90 tablet  3  . Multiple Vitamin (MULITIVITAMIN WITH MINERALS) TABS Take 1 tablet by mouth daily.      . Omega-3 Fatty Acids (FISH OIL PO) Take 1 capsule by mouth daily.       Marland Kitchen omeprazole (PRILOSEC) 20 MG capsule Take 1 capsule (20 mg total) by mouth daily.  90 capsule  1  . saxagliptin HCl (ONGLYZA) 2.5 MG TABS tablet Take 1 tablet (2.5 mg total) by mouth daily.  30 tablet  2  . warfarin (COUMADIN) 5 MG tablet Take 1 tablet (5 mg total) by mouth daily.  30 tablet  1   No current facility-administered medications for this visit.    Allergies:    Allergies  Allergen Reactions  . Ceftriaxone Sodium In Dextrose Swelling and Other (See Comments)    Causes lip swelling and redness    Social History:  The patient  reports that he quit smoking about 17 months ago. His smoking use included Cigarettes. He has a 62.4 pack-year smoking history. His smokeless tobacco use includes Chew. He reports that he does not  drink alcohol or use illicit drugs.  ROS:  Please see the history of present illness.   Denies cough, melena, hematochezia.   All other systems reviewed and negative.   PHYSICAL EXAM: VS:  BP 118/64  Pulse 106  Ht 5\' 6"  (1.676 m)  Wt 203 lb (92.08 kg)  BMI 32.78 kg/m2 Well nourished, well developed, in no acute distress HEENT: normal Neck: no JVD at 90 degrees Cardiac:  normal S1, S2; irregularly irregular rhythm; 2/6 systolic murmur heard best at the RUSB Lungs:  Decreased breath sounds bilaterally, dry crackles at the bases, no wheezing Abd: soft, nontender  Ext: no edema  Skin: warm and dry Neuro:  CNs 2-12 intact, no focal abnormalities noted  EKG:  AFib, HR 106, no change from prior tracings     ASSESSMENT AND PLAN:  1. Atrial Fibrillation:  Rate increased.  He may be more symptomatic with this.  His INRs have been therapeutic for 5 weeks.  I have recommended proceeding with DCCV tomorrow.  He is agreeable to this.  D/w Dr. Cassell Clement (DOD) who agrees.  Patient will be set up for DCCV tomorrow.   2. Chronic Combined Systolic and Diastolic CHF:   His volume appears stable.  He is due for repeat BMET today. 3. CAD:  No angina.  No ASA as he is on coumadin.  Continue statin. 4. Aortic Stenosis:  Mild to moderate by recent echo.  He will need periodic echocardiograms to follow this. 5. Hypertension:  Controlled.  Continue current therapy.  6. Hyperlipidemia:  Continue statin.    7. Thrombocytopenia:  Follow up with PCP and hematology prn. 8. COPD:  F/u with pulmonology as planned. 9. Disposition:  F/u with Dr. Marca Ancona as planned.  Luna Glasgow, PA-C  12:45 PM 01/19/2013

## 2013-01-19 NOTE — Telephone Encounter (Signed)
New problem    Per pts wife-thinks pt has not gotten better and she wants to know if he can be seen today-pt is in Afib

## 2013-01-20 ENCOUNTER — Ambulatory Visit (HOSPITAL_COMMUNITY): Payer: Medicare PPO | Admitting: Anesthesiology

## 2013-01-20 ENCOUNTER — Ambulatory Visit (HOSPITAL_COMMUNITY)
Admission: RE | Admit: 2013-01-20 | Discharge: 2013-01-20 | Disposition: A | Discharge status: Home / Self Care | Payer: Medicare PPO | Source: Ambulatory Visit | Attending: Cardiology | Admitting: Cardiology

## 2013-01-20 ENCOUNTER — Encounter (HOSPITAL_COMMUNITY)
Admission: RE | Disposition: A | Discharge status: Home / Self Care | Payer: Self-pay | Source: Ambulatory Visit | Attending: Cardiology

## 2013-01-20 ENCOUNTER — Encounter (HOSPITAL_COMMUNITY): Payer: Self-pay | Admitting: *Deleted

## 2013-01-20 ENCOUNTER — Encounter (HOSPITAL_COMMUNITY): Payer: Self-pay | Admitting: Anesthesiology

## 2013-01-20 ENCOUNTER — Telehealth: Payer: Self-pay | Admitting: *Deleted

## 2013-01-20 DIAGNOSIS — I509 Heart failure, unspecified: Secondary | ICD-10-CM | POA: Insufficient documentation

## 2013-01-20 DIAGNOSIS — I359 Nonrheumatic aortic valve disorder, unspecified: Secondary | ICD-10-CM | POA: Insufficient documentation

## 2013-01-20 DIAGNOSIS — J4489 Other specified chronic obstructive pulmonary disease: Secondary | ICD-10-CM | POA: Insufficient documentation

## 2013-01-20 DIAGNOSIS — I4891 Unspecified atrial fibrillation: Secondary | ICD-10-CM

## 2013-01-20 DIAGNOSIS — I5043 Acute on chronic combined systolic (congestive) and diastolic (congestive) heart failure: Secondary | ICD-10-CM | POA: Insufficient documentation

## 2013-01-20 DIAGNOSIS — D696 Thrombocytopenia, unspecified: Secondary | ICD-10-CM | POA: Insufficient documentation

## 2013-01-20 DIAGNOSIS — E785 Hyperlipidemia, unspecified: Secondary | ICD-10-CM | POA: Insufficient documentation

## 2013-01-20 DIAGNOSIS — J449 Chronic obstructive pulmonary disease, unspecified: Secondary | ICD-10-CM | POA: Insufficient documentation

## 2013-01-20 DIAGNOSIS — I2510 Atherosclerotic heart disease of native coronary artery without angina pectoris: Secondary | ICD-10-CM | POA: Insufficient documentation

## 2013-01-20 DIAGNOSIS — I10 Essential (primary) hypertension: Secondary | ICD-10-CM | POA: Insufficient documentation

## 2013-01-20 HISTORY — PX: CARDIOVERSION: SHX1299

## 2013-01-20 HISTORY — DX: Gastro-esophageal reflux disease without esophagitis: K21.9

## 2013-01-20 HISTORY — DX: Shortness of breath: R06.02

## 2013-01-20 SURGERY — CARDIOVERSION
Anesthesia: Monitor Anesthesia Care

## 2013-01-20 MED ORDER — SODIUM CHLORIDE 0.9 % IV SOLN
250.0000 mL | INTRAVENOUS | Status: DC
  Administered 2013-01-20: 12:00:00 via INTRAVENOUS

## 2013-01-20 MED ORDER — SODIUM CHLORIDE 0.9 % IJ SOLN: 3.0000 mL | Freq: Two times a day (BID) | INTRAMUSCULAR | Status: DC

## 2013-01-20 MED ORDER — PROPOFOL 10 MG/ML IV BOLUS
INTRAVENOUS | Status: DC | PRN
  Administered 2013-01-20: 50 mg via INTRAVENOUS

## 2013-01-20 MED ORDER — SODIUM CHLORIDE 0.9 % IJ SOLN: 3.0000 mL | INTRAMUSCULAR | Status: DC | PRN

## 2013-01-20 MED ORDER — LIDOCAINE HCL (CARDIAC) 20 MG/ML IV SOLN
INTRAVENOUS | Status: DC | PRN
  Administered 2013-01-20: 30 mg via INTRAVENOUS

## 2013-01-20 MED ORDER — SODIUM CHLORIDE 0.9 % IV SOLN
INTRAVENOUS | Status: DC
  Administered 2013-01-20: 500 mL via INTRAVENOUS

## 2013-01-20 NOTE — Transfer of Care (Signed)
Immediate Anesthesia Transfer of Care Note  Patient: Joe Jackson  Procedure(s) Performed: Procedure(s): CARDIOVERSION (N/A)  Patient Location: PACU  Anesthesia Type:MAC  Level of Consciousness: awake, alert , oriented and patient cooperative  Airway & Oxygen Therapy: Patient Spontanous Breathing and Patient connected to nasal cannula oxygen  Post-op Assessment: Report given to PACU RN, Post -op Vital signs reviewed and stable and Patient moving all extremities X 4  Post vital signs: Reviewed and stable  Complications: No apparent anesthesia complications

## 2013-01-20 NOTE — CV Procedure (Signed)
Electrical Cardioversion Procedure Note Joe Jackson [id number] Nov 03, 1929  Procedure: Electrical Cardioversion Indications:  Atrial Fibrillation  Procedure Details Consent: Risks of procedure as well as the alternatives and risks of each were explained to the (patient/caregiver).  Consent for procedure obtained. Time Out: Verified patient identification, verified procedure, site/side was marked, verified correct patient position, special equipment/implants available, medications/allergies/relevent history reviewed, required imaging and test results available.  Performed  Patient placed on cardiac monitor, pulse oximetry, supplemental oxygen as necessary.  Sedation given: propofol Pacer pads placed anterior and posterior chest.  Cardioverted 1 time(s).  Cardioverted at 120J.  Evaluation Findings: Post procedure EKG shows: NSR Complications: None Patient did tolerate procedure well.   Cassell Clement 01/20/2013, 12:16 PM

## 2013-01-20 NOTE — Telephone Encounter (Signed)
Message copied by Tarri Fuller on Thu Jan 20, 2013  5:56 PM ------      Message from: [place], [place] T      Created: Wed Jan 19, 2013  9:43 PM       K+ and creatinine stable      Continue with current treatment plan.      Tereso Newcomer, PA-C        01/19/2013 9:43 PM ------

## 2013-01-20 NOTE — Anesthesia Procedure Notes (Signed)
Procedure Name: MAC Date/Time: 01/20/2013 12:12 PM Performed by: Sherie Don Pre-anesthesia Checklist: Patient identified, Emergency Drugs available, Suction available, Patient being monitored and Timeout performed Patient Re-evaluated:Patient Re-evaluated prior to inductionOxygen Delivery Method: Circle system utilized Preoxygenation: Pre-oxygenation with 100% oxygen Intubation Type: IV induction Ventilation: Mask ventilation without difficulty

## 2013-01-20 NOTE — H&P (Signed)
Please see history and physical dated 01/19/13. No changes since yesterday. EF estimated 45%

## 2013-01-20 NOTE — Anesthesia Preprocedure Evaluation (Addendum)
Anesthesia Evaluation  Patient identified by MRN, date of birth, ID band Patient awake    Reviewed: Allergy & Precautions, H&P , NPO status , Patient's Chart, lab work & pertinent test results  Airway Mallampati: II TM Distance: >3 FB     Dental   Pulmonary sleep apnea , COPDformer smoker,  breath sounds clear to auscultation        Cardiovascular hypertension, + CAD, + Past MI, + Peripheral Vascular Disease and +CHF + dysrhythmias Atrial Fibrillation + Valvular Problems/Murmurs AS Rhythm:Irregular Rate:Normal     Neuro/Psych    GI/Hepatic GERD-  ,  Endo/Other  diabetes  Renal/GU Renal InsufficiencyRenal disease     Musculoskeletal   Abdominal (+) + obese,   Peds  Hematology  (+) Blood dyscrasia, ,   Anesthesia Other Findings   Reproductive/Obstetrics                          Anesthesia Physical Anesthesia Plan  ASA: III  Anesthesia Plan: General   Post-op Pain Management:    Induction: Intravenous  Airway Management Planned: Mask  Additional Equipment:   Intra-op Plan:   Post-operative Plan:   Informed Consent: I have reviewed the patients History and Physical, chart, labs and discussed the procedure including the risks, benefits and alternatives for the proposed anesthesia with the patient or authorized representative who has indicated his/her understanding and acceptance.     Plan Discussed with: CRNA and Surgeon  Anesthesia Plan Comments:         Anesthesia Quick Evaluation

## 2013-01-20 NOTE — Telephone Encounter (Signed)
wife notified about lab results today and said Dr. Shirlee Latch f/u post DCCV end of July too far out, said Dr. Patty Sermons said needs to be 1 week. Ok per [employer]. PA to dbl bk 01/25/13 for f/u on DCCV.Marland Kitchen

## 2013-01-20 NOTE — Anesthesia Postprocedure Evaluation (Signed)
  Anesthesia Post-op Note  Patient: Joe Jackson  Procedure(s) Performed: Procedure(s): CARDIOVERSION (N/A)  Patient Location: PACU  Anesthesia Type:MAC  Level of Consciousness: awake, alert , oriented and patient cooperative  Airway and Oxygen Therapy: Patient Spontanous Breathing  Post-op Pain: none  Post-op Assessment: Post-op Vital signs reviewed, Patient's Cardiovascular Status Stable, Respiratory Function Stable, Patent Airway and No signs of Nausea or vomiting  Post-op Vital Signs: Reviewed and stable  Complications: No apparent anesthesia complications

## 2013-01-20 NOTE — Preoperative (Signed)
Beta Blockers   Reason not to administer Beta Blockers:Not Applicable 

## 2013-01-20 NOTE — Anesthesia Postprocedure Evaluation (Signed)
  Anesthesia Post-op Note  Patient: Joe Jackson  Procedure(s) Performed: Procedure(s): CARDIOVERSION (N/A)  Patient Location: PACU and Endoscopy Unit  Anesthesia Type:General  Level of Consciousness: awake  Airway and Oxygen Therapy: Patient Spontanous Breathing  Post-op Pain: none  Post-op Assessment: Post-op Vital signs reviewed, Patient's Cardiovascular Status Stable, Respiratory Function Stable, Patent Airway, No signs of Nausea or vomiting and Pain level controlled  Post-op Vital Signs: stable  Complications: No apparent anesthesia complications

## 2013-01-24 ENCOUNTER — Other Ambulatory Visit: Payer: Medicare PPO

## 2013-01-24 ENCOUNTER — Encounter (HOSPITAL_COMMUNITY): Payer: Self-pay | Admitting: Cardiology

## 2013-01-25 ENCOUNTER — Ambulatory Visit (INDEPENDENT_AMBULATORY_CARE_PROVIDER_SITE_OTHER): Payer: Medicare PPO | Admitting: Physician Assistant

## 2013-01-25 ENCOUNTER — Encounter: Payer: Self-pay | Admitting: Physician Assistant

## 2013-01-25 ENCOUNTER — Ambulatory Visit (INDEPENDENT_AMBULATORY_CARE_PROVIDER_SITE_OTHER): Payer: Medicare PPO | Admitting: *Deleted

## 2013-01-25 VITALS — BP 140/58 | HR 53 | Ht 68.0 in | Wt 203.0 lb

## 2013-01-25 DIAGNOSIS — I10 Essential (primary) hypertension: Secondary | ICD-10-CM

## 2013-01-25 DIAGNOSIS — D696 Thrombocytopenia, unspecified: Secondary | ICD-10-CM

## 2013-01-25 DIAGNOSIS — R0602 Shortness of breath: Secondary | ICD-10-CM

## 2013-01-25 DIAGNOSIS — J8410 Pulmonary fibrosis, unspecified: Secondary | ICD-10-CM

## 2013-01-25 DIAGNOSIS — I4891 Unspecified atrial fibrillation: Secondary | ICD-10-CM

## 2013-01-25 DIAGNOSIS — J449 Chronic obstructive pulmonary disease, unspecified: Secondary | ICD-10-CM

## 2013-01-25 DIAGNOSIS — I509 Heart failure, unspecified: Secondary | ICD-10-CM

## 2013-01-25 DIAGNOSIS — J4489 Other specified chronic obstructive pulmonary disease: Secondary | ICD-10-CM

## 2013-01-25 DIAGNOSIS — I5042 Chronic combined systolic (congestive) and diastolic (congestive) heart failure: Secondary | ICD-10-CM

## 2013-01-25 DIAGNOSIS — I219 Acute myocardial infarction, unspecified: Secondary | ICD-10-CM

## 2013-01-25 DIAGNOSIS — E785 Hyperlipidemia, unspecified: Secondary | ICD-10-CM

## 2013-01-25 DIAGNOSIS — I480 Paroxysmal atrial fibrillation: Secondary | ICD-10-CM

## 2013-01-25 DIAGNOSIS — Z7901 Long term (current) use of anticoagulants: Secondary | ICD-10-CM

## 2013-01-25 DIAGNOSIS — I2510 Atherosclerotic heart disease of native coronary artery without angina pectoris: Secondary | ICD-10-CM

## 2013-01-25 NOTE — Progress Notes (Signed)
[address], [address] [place], [place]  [address] Phone: [phone] Fax:  [phone]  Date:  01/25/2013   ID:  Joe Jackson, DOB 1929-10-15, MRN [id number]  PCP:  Loreen Freud, DO  Primary Cardiologist:  Dr.  Shawnie Pons => Dr. Marca Ancona    History of Present Illness: Joe Jackson is a 79 y.o. male who is seen in f/u after recent DCCV.   He has a hx of CAD, s/p MI in 2001, combined systolic and diastolic CHF (EF 29% in the past), AAA (followed by Dr. Edilia Bo), pulmonary fibrosis (Dr. Vassie Loll), COPD, atrial fibrillation, HTN, HL.  Last cath in 2001 demonstrated an occluded OM and diffusely diseased RCA with distal occlusion and L->L and L->R collats.  He was seen by cardiology during an admission in 3/14 with AFib with RVR in the setting of a/c respiratory failure/pneumonia.  He was started on rate control therapy with diltiazem. He converted to NSR. He was placed on warfarin.  Echo 09/25/12: EF 40-45%, posterior lateral AK, grade 1 diastolic dysfunction, mild to moderate aortic stenosis (mean gradient 14), MAC, mild MR, mild LAE.  He was readmitted 4/11-4/17 with a/c respiratory failure secondary to acute on chronic combined systolic and diastolic CHF in the setting of possible healthcare associated pneumonia and recurrent AFib with RVR. He has hx of a low-grade chronic immune thrombocytopenia.  He has seen GI for recent BRBPR.  Colonoscopy was recommended but patient declined for now.   I had seen him recently with volume overload.  He had marginal improvement in his symptoms with increase dosages of Lasix.  He established with Dr. Shirlee Latch in May. When last seen 01/05/13, it was felt that atrial fibrillation may be the trigger for worsening CHF. He was set up for DCCV Monday 6/23.  However, he came in to the office prior to that with c/o dyspnea and we arranged his DCCV for 01/20/13.  He had restoration of NSR.  He remains in NSR today.  He continues to note DOE.  Also notes  dyspnea with bending over.  Sleeps on an incline.  No changes.  No PND.  No increased LE edema.  Weights stable at home.  No chest pain.  No syncope.  No near syncope.   Labs (4/14):  K 4.9, Cr 1.24=>1.6=>1.5, ALT 82, Hgb 11.7=>10.8, PLT 79, TSH 0.772 Labs (5/14): BNP 1870  Labs (6/14): K 3.8, creatinine 1.4=>1.7=>1.4, BNP 266, LDL 87   Wt Readings from Last 3 Encounters:  01/25/13 203 lb (92.08 kg)  01/19/13 203 lb (92.08 kg)  01/05/13 201 lb (91.173 kg)     Past Medical History  Diagnosis Date  . Hypertension   . Hyperlipidemia   . Chronic combined systolic and diastolic CHF (congestive heart failure)     a. 10/2012 Echo: EF 40-45%, Gr1 dd, postlat AK, mild to mod AS, Triv AI, Mild MR, mildly dil LA.  . Sleep apnea   . Aortic aneurysm, abdominal     f/u by [facility]  . Pulmonary fibrosis   . Ischemic cardiomyopathy     a. EF 40-45% by echo 10/2012  . CAD (coronary artery disease)     a. s/p prior MI;  b. 11/1999 Cath: LM nl, LAD 14m, LCX min irregs, OM 100, RCA 80p, 100d, L->L and L->R collats, EF 54%.  Marland Kitchen COPD (chronic obstructive pulmonary disease)   . PAF (paroxysmal atrial fibrillation)     a. Dx 10/2012 during hosp for resp failure/copd flare  .  Aortic stenosis     mild to mod by echo 10/2012 (mean gradient 14 mmHg)  . Shortness of breath   . GERD (gastroesophageal reflux disease)     Current Outpatient Prescriptions  Medication Sig Dispense Refill  . atorvastatin (LIPITOR) 40 MG tablet Take 0.5 tablets (20 mg total) by mouth at bedtime.  45 tablet  3  . bisoprolol (ZEBETA) 10 MG tablet Take 1 tablet (10 mg total) by mouth daily.  90 tablet  3  . budesonide-formoterol (SYMBICORT) 160-4.5 MCG/ACT inhaler Inhale 2 puffs into the lungs 2 (two) times daily.  1 Inhaler  11  . cetirizine (ZYRTEC) 10 MG tablet Take 10 mg by mouth daily.        . Coenzyme Q10 (CO Q 10 PO) Take 1 tablet by mouth daily.       . Cyanocobalamin (VITAMIN B-12 PO) Take 1 tablet by mouth daily.       Marland Kitchen  docusate sodium (COLACE) 100 MG capsule Take 100 mg by mouth daily.      . furosemide (LASIX) 40 MG tablet 2 tablets (total 80mg ) daily  180 tablet  3  . Garlic (ODOR FREE GARLIC) 100 MG TABS Take 1 tablet by mouth daily.        Marland Kitchen guaiFENesin (MUCINEX) 600 MG 12 hr tablet Take 1,200 mg by mouth daily.       Marland Kitchen levalbuterol (XOPENEX) 0.63 MG/3ML nebulizer solution Take 3 mLs (0.63 mg total) by nebulization every 2 (two) hours as needed for wheezing or shortness of breath.  3 mL  11  . losartan (COZAAR) 25 MG tablet Take 1 tablet (25 mg total) by mouth daily.  90 tablet  3  . Multiple Vitamin (MULITIVITAMIN WITH MINERALS) TABS Take 1 tablet by mouth daily.      . Omega-3 Fatty Acids (FISH OIL PO) Take 1 capsule by mouth daily.       Marland Kitchen omeprazole (PRILOSEC) 20 MG capsule Take 1 capsule (20 mg total) by mouth daily.  90 capsule  1  . warfarin (COUMADIN) 5 MG tablet Take 1 tablet (5 mg total) by mouth daily.  30 tablet  1   No current facility-administered medications for this visit.    Allergies:    Allergies  Allergen Reactions  . Ceftriaxone Sodium In Dextrose Swelling and Other (See Comments)    Causes lip swelling and redness    Social History:  The patient  reports that he quit smoking about 17 months ago. His smoking use included Cigarettes. He has a 62.4 pack-year smoking history. His smokeless tobacco use includes Chew. He reports that he does not drink alcohol or use illicit drugs.   ROS:  Please see the history of present illness.  No melena, hematochezia, hematuria.  All other systems reviewed and negative.   PHYSICAL EXAM: VS:  BP 140/58  Pulse 53  Ht 5\' 8"  (1.727 m)  Wt 203 lb (92.08 kg)  BMI 30.87 kg/m2 Well nourished, well developed, in no acute distress HEENT: normal Neck: no JVD  Cardiac:  normal S1, S2; RRR; 2/6 systolic murmur heard best at the RUSB Lungs:  Decreased breath sounds bilat, no wheezing, no rales Abd: soft, nontender  Ext: trace-1+ bilat (R>L) edema    Skin: warm and dry Neuro:  CNs 2-12 intact, no focal abnormalities noted  EKG:  Sinus brady, HR 53, IVCD, NSSTTW changes    ASSESSMENT AND PLAN:  1. Atrial Fibrillation:  Maintaining NSR.  Continue coumadin.  2. Dyspnea:  I suspect this is multifactorial and related to COPD, ILD, chronic combined systolic and diastolic CHF, ischemic heart disease, obesity and deconditioning.  He has been short of breath for years.  But, has noted a difference since he was admitted with CAP in 10/2012.  I have suggested he f/u with pulmonary.  Of note, O2 sats today with ambulation remained >/= 90% on RA.  Keep f/u with Dr. Marca Ancona late next month.  If dyspnea is no better, could consider myoview vs R/L heart cath. 3. Chronic Combined Systolic and Diastolic CHF:   Volume appears stable.  Check BMET and BNP.  If BNP higher, adjust Lasix. 4. CAD:  No angina.  No ASA as he is on coumadin.  Continue statin. 5. Aortic Stenosis:  Mild to moderate by recent echo.  He will need periodic echocardiograms to follow this.   6. Hypertension:  Controlled.  Continue current therapy.  7. Hyperlipidemia:  Continue statin.    8. Thrombocytopenia:  Follow up with PCP and hematology prn. 9. COPD:  F/u with pulmonology as noted. 10. Disposition:  F/u with Dr. Marca Ancona 7/31 as planned.  Signed, Tereso Newcomer, PA-C  3:28 PM 01/25/2013

## 2013-01-25 NOTE — Patient Instructions (Addendum)
LABS TODAY:  BMET & BNP  Schedule follow up appointment with Dr. Vassie Loll soon for evaluation of Dyspnea w/ history of COPD and Pulmonary Fibrosis.  Keep appt with Dr. Shirlee Latch in July.

## 2013-01-26 LAB — BASIC METABOLIC PANEL
BUN: 35 mg/dL — ABNORMAL HIGH (ref 6–23)
CO2: 29 mEq/L (ref 19–32)
Chloride: 102 mEq/L (ref 96–112)
Creatinine, Ser: 1.6 mg/dL — ABNORMAL HIGH (ref 0.4–1.5)
Glucose, Bld: 99 mg/dL (ref 70–99)

## 2013-01-27 ENCOUNTER — Telehealth: Payer: Self-pay | Admitting: Pulmonary Disease

## 2013-01-27 ENCOUNTER — Ambulatory Visit (INDEPENDENT_AMBULATORY_CARE_PROVIDER_SITE_OTHER): Payer: Medicare PPO | Admitting: Adult Health

## 2013-01-27 ENCOUNTER — Encounter: Payer: Self-pay | Admitting: Adult Health

## 2013-01-27 ENCOUNTER — Telehealth: Payer: Self-pay | Admitting: *Deleted

## 2013-01-27 ENCOUNTER — Telehealth: Payer: Self-pay | Admitting: Physician Assistant

## 2013-01-27 VITALS — BP 122/74 | HR 68 | Temp 98.5°F | Ht 67.0 in | Wt 201.4 lb

## 2013-01-27 DIAGNOSIS — J449 Chronic obstructive pulmonary disease, unspecified: Secondary | ICD-10-CM

## 2013-01-27 DIAGNOSIS — I5042 Chronic combined systolic (congestive) and diastolic (congestive) heart failure: Secondary | ICD-10-CM

## 2013-01-27 DIAGNOSIS — Z79899 Other long term (current) drug therapy: Secondary | ICD-10-CM

## 2013-01-27 DIAGNOSIS — I509 Heart failure, unspecified: Secondary | ICD-10-CM

## 2013-01-27 MED ORDER — TIOTROPIUM BROMIDE MONOHYDRATE 18 MCG IN CAPS: 18.0000 ug | ORAL_CAPSULE | Freq: Every day | RESPIRATORY_TRACT | Status: DC

## 2013-01-27 NOTE — Telephone Encounter (Signed)
Spoke with [name]  She reports pt having increased SOB  Woke up several times in the night to take neb txs OV with TP at 3 pm

## 2013-01-27 NOTE — Telephone Encounter (Signed)
Follow Up  Pt's wife wants to speak with you regarding your previous conversation with her.

## 2013-01-27 NOTE — Patient Instructions (Addendum)
Begin Spiriva 1 puff daily  Low salt diet  Take extra lasix as recommended by Cardiology  follow up Dr. Vassie Loll  In 3 weeks and As needed   Please contact office for sooner follow up if symptoms do not improve or worsen or seek emergency care

## 2013-01-27 NOTE — Telephone Encounter (Signed)
Wife phoned back stating that patient had a bad night breathing and had to do a breathing treatment. Per wife no weight gain and legs look good. Wife stated he was having such a hard time breathing and she was very concerned about him. Did ask if he was feeling as bad as he was when he went to ED in May and she stated yes. Discussed with Bing Neighbors. PA and he suggest going to ED if he feels that bad. Advised wife, verbalized understanding. Wife was going to call pulmonary doctor first to see if she could gt an appointment. Advised wife if not able to get appointment go to ED.
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
## 2013-01-27 NOTE — Telephone Encounter (Signed)
Message copied by Burnell Blanks on Thu Jan 27, 2013  8:41 AM ------      Message from: [place], [place] T      Created: Wed Jan 26, 2013  1:39 PM       Potassium okay      Creatinine stable      BNP just slightly increased      Take Lasix 80 mg in the a.m., 40 mg in the p.m. for 3 days      Then, resume usual dose      Repeat BMET in 1 week      Tereso Newcomer, PA-C        01/26/2013 1:39 PM ------

## 2013-01-27 NOTE — Telephone Encounter (Signed)
Advised wife, verbalized understanding. Scheduled follow up labs

## 2013-01-28 NOTE — Assessment & Plan Note (Signed)
?   Mild decompensation contributing to symptoms w/ rising BNP  Cont w/ Cards recommendations for diuresis .   Plan    Low salt diet  Take extra lasix as recommended by Cardiology  follow up Dr. Vassie Loll  In 3 weeks and As needed   Please contact office for sooner follow up if symptoms do not improve or worsen or seek emergency care

## 2013-01-28 NOTE — Assessment & Plan Note (Signed)
?   Mild flare -suspect most of symptoms are cardiac related.  Spirometry has showed no airway obstruction  No active wheezing, will hold off on steroids for now as fluid retention may be main issue Trial of spiriva -dobut will  Add much  Plan  Begin Spiriva 1 puff daily  Low salt diet  Take extra lasix as recommended by Cardiology  follow up Dr. Vassie Loll  In 3 weeks and As needed   Please contact office for sooner follow up if symptoms do not improve or worsen or seek emergency care

## 2013-01-30 ENCOUNTER — Encounter: Payer: Self-pay | Admitting: Adult Health

## 2013-02-01 ENCOUNTER — Ambulatory Visit: Payer: Medicare PPO | Admitting: Pulmonary Disease

## 2013-02-01 ENCOUNTER — Encounter: Payer: Self-pay | Admitting: Adult Health

## 2013-02-02 ENCOUNTER — Telehealth: Payer: Self-pay | Admitting: *Deleted

## 2013-02-02 ENCOUNTER — Telehealth: Payer: Self-pay | Admitting: Pulmonary Disease

## 2013-02-02 DIAGNOSIS — J84112 Idiopathic pulmonary fibrosis: Secondary | ICD-10-CM

## 2013-02-02 NOTE — Telephone Encounter (Signed)
Duplicate msg LMTCB

## 2013-02-02 NOTE — Telephone Encounter (Signed)
Message copied by Lowell Bouton on Wed Feb 02, 2013  1:13 PM ------      Message from: Julio Sicks      Created: Tue Feb 01, 2013  4:20 PM       Jess can you set this up and call pt             Tam       ----- Message -----         From: Oretha Milch, MD         Sent: 01/30/2013   1:43 AM           To: Julio Sicks, NP            Needs HRCT chest - is this IPF ?             ------

## 2013-02-02 NOTE — Telephone Encounter (Signed)
Pt's spouse called back and new msg was taken I closed since duplicate msg LMTCB for Ro[place]e

## 2013-02-02 NOTE — Telephone Encounter (Signed)
Last CT was in 2009 Called spoke with patient, advised RA would like him to have a CT Pt stated that his breathing is improved since last ov and would like to discuss this with his wife Pt stated he will call back to schedule  Has upcoming ov w/ RA 7.15.14 for follow up

## 2013-02-03 ENCOUNTER — Ambulatory Visit: Payer: Medicare PPO | Admitting: Cardiology

## 2013-02-03 ENCOUNTER — Other Ambulatory Visit: Payer: Self-pay | Admitting: *Deleted

## 2013-02-03 ENCOUNTER — Telehealth: Payer: Self-pay | Admitting: Cardiology

## 2013-02-03 ENCOUNTER — Other Ambulatory Visit (INDEPENDENT_AMBULATORY_CARE_PROVIDER_SITE_OTHER): Payer: Medicare PPO

## 2013-02-03 DIAGNOSIS — Z79899 Other long term (current) drug therapy: Secondary | ICD-10-CM

## 2013-02-03 DIAGNOSIS — I5042 Chronic combined systolic (congestive) and diastolic (congestive) heart failure: Secondary | ICD-10-CM

## 2013-02-03 LAB — BASIC METABOLIC PANEL
CO2: 26 mEq/L (ref 19–32)
Calcium: 9.4 mg/dL (ref 8.4–10.5)
Creatinine, Ser: 2.2 mg/dL — ABNORMAL HIGH (ref 0.4–1.5)
GFR: 31.48 mL/min — ABNORMAL LOW (ref >60.00)

## 2013-02-03 NOTE — Telephone Encounter (Signed)
New Problem:    Patient's wife call in wanting to know the results of her husband's latest labs.  Please call back.

## 2013-02-03 NOTE — Telephone Encounter (Signed)
I spoke with patient.

## 2013-02-03 NOTE — Telephone Encounter (Signed)
Pt scheduled 02/09/13@[facility]  wife is aware of this appt Joe Jackson

## 2013-02-03 NOTE — Telephone Encounter (Signed)
I spoke with spouse. She thought the CT was being set up. I advised her per pt he wanted to talk with her 1st before we did. She was calling to get this set up. Please call her at [phone]. Please advise PCC's thanks

## 2013-02-03 NOTE — Telephone Encounter (Signed)
Voicemail.

## 2013-02-07 ENCOUNTER — Other Ambulatory Visit (INDEPENDENT_AMBULATORY_CARE_PROVIDER_SITE_OTHER): Payer: Medicare PPO

## 2013-02-07 ENCOUNTER — Telehealth: Payer: Self-pay | Admitting: Cardiology

## 2013-02-07 DIAGNOSIS — I10 Essential (primary) hypertension: Secondary | ICD-10-CM

## 2013-02-07 NOTE — Telephone Encounter (Signed)
Returned call to patient's wife she stated patient has gained 4 lbs.since since this past Friday 02/04/13.Stated lasix was held for 2 days 02/04/13 and 02/05/13 due to abnormal kidney functions.Stated no swelling but husband is sob.Stated he is scheduled for a chest ct,bmet,inr Wednesday 02/09/13.Dr.McLean out of office, will check with DOD Dr.McAlhany.

## 2013-02-07 NOTE — Telephone Encounter (Signed)
Returned call to patient's wife spoke to DOD Dr.McAlhany he advised to have repeat bmet done today and depending on results may increase lasix.

## 2013-02-07 NOTE — Telephone Encounter (Signed)
New Problem  Pt's wife said that his weight has increased to 198. She said that on 6.30 he weighed 191.  She said that when you call her to tell them to find her because it is important that she speaks with a nurse.

## 2013-02-08 LAB — BASIC METABOLIC PANEL
BUN: 41 mg/dL — ABNORMAL HIGH (ref 6–23)
Creatinine, Ser: 1.5 mg/dL (ref 0.4–1.5)
GFR: 46.27 mL/min — ABNORMAL LOW (ref >60.00)
Potassium: 4.3 mEq/L (ref 3.5–5.1)

## 2013-02-08 NOTE — Telephone Encounter (Signed)
Patient's wife called she stated patient lost 1 lb this morning.Bmet done yesterday 02/07/13 results not available.Wife stated call her back with results on her cell [phone].

## 2013-02-08 NOTE — Telephone Encounter (Signed)
Spoke with pt's wife and gave her instructions from Dr. Clifton Quintan to continue Lasix 60 mg daily and to call our office later this week with update on weight and symptoms.  Pt will come in for BMP on February 14, 2013

## 2013-02-09 ENCOUNTER — Ambulatory Visit (INDEPENDENT_AMBULATORY_CARE_PROVIDER_SITE_OTHER)
Admission: RE | Admit: 2013-02-09 | Discharge: 2013-02-09 | Disposition: A | Discharge status: Home / Self Care | Payer: Medicare PPO | Source: Ambulatory Visit | Attending: Pulmonary Disease | Admitting: Pulmonary Disease

## 2013-02-09 ENCOUNTER — Ambulatory Visit (INDEPENDENT_AMBULATORY_CARE_PROVIDER_SITE_OTHER): Payer: Medicare PPO | Admitting: *Deleted

## 2013-02-09 ENCOUNTER — Other Ambulatory Visit: Payer: Medicare PPO

## 2013-02-09 DIAGNOSIS — Z7901 Long term (current) use of anticoagulants: Secondary | ICD-10-CM

## 2013-02-09 DIAGNOSIS — J84112 Idiopathic pulmonary fibrosis: Secondary | ICD-10-CM

## 2013-02-09 DIAGNOSIS — I480 Paroxysmal atrial fibrillation: Secondary | ICD-10-CM

## 2013-02-09 DIAGNOSIS — I219 Acute myocardial infarction, unspecified: Secondary | ICD-10-CM

## 2013-02-09 DIAGNOSIS — D696 Thrombocytopenia, unspecified: Secondary | ICD-10-CM

## 2013-02-09 DIAGNOSIS — I4891 Unspecified atrial fibrillation: Secondary | ICD-10-CM

## 2013-02-09 LAB — POCT INR: INR: 3.8

## 2013-02-09 NOTE — Telephone Encounter (Signed)
Pt and wife came for a coumadin clinic;  while here  let Dr. Shirlee Latch know that he has lost two more lbs. Pt has no c/o at this time . Pt will go to [facility] [employer] office on Monday 7/14 for labs; lab department on that office aware.

## 2013-02-14 ENCOUNTER — Other Ambulatory Visit (INDEPENDENT_AMBULATORY_CARE_PROVIDER_SITE_OTHER): Payer: Medicare PPO

## 2013-02-14 ENCOUNTER — Other Ambulatory Visit: Payer: Medicare PPO

## 2013-02-14 DIAGNOSIS — R7402 Elevation of levels of lactic acid dehydrogenase (LDH): Secondary | ICD-10-CM

## 2013-02-14 DIAGNOSIS — D7289 Other specified disorders of white blood cells: Secondary | ICD-10-CM

## 2013-02-14 DIAGNOSIS — E785 Hyperlipidemia, unspecified: Secondary | ICD-10-CM

## 2013-02-14 LAB — CBC WITH DIFFERENTIAL/PLATELET
Basophils Relative: 0.5 % (ref 0.0–3.0)
Eosinophils Relative: 7 % — ABNORMAL HIGH (ref 0.0–5.0)
HCT: 36.3 % — ABNORMAL LOW (ref 39.0–52.0)
Hemoglobin: 11.7 g/dL — ABNORMAL LOW (ref 13.0–17.0)
Lymphs Abs: 2 10*3/uL (ref 0.7–4.0)
Monocytes Relative: 7.2 % (ref 3.0–12.0)
Platelets: 173 10*3/uL (ref 150.0–400.0)
RBC: 4.4 Mil/uL (ref 4.22–5.81)
WBC: 7.3 10*3/uL (ref 4.5–10.5)

## 2013-02-14 LAB — HEPATIC FUNCTION PANEL
ALT: 16 U/L (ref 0–53)
AST: 16 U/L (ref 0–37)
Total Bilirubin: 0.9 mg/dL (ref 0.3–1.2)

## 2013-02-15 ENCOUNTER — Ambulatory Visit (INDEPENDENT_AMBULATORY_CARE_PROVIDER_SITE_OTHER): Payer: Medicare PPO | Admitting: Pulmonary Disease

## 2013-02-15 ENCOUNTER — Encounter: Payer: Self-pay | Admitting: Pulmonary Disease

## 2013-02-15 ENCOUNTER — Ambulatory Visit: Payer: Medicare PPO | Admitting: Pulmonary Disease

## 2013-02-15 VITALS — BP 110/64 | HR 83 | Temp 97.7°F | Ht 67.0 in | Wt 201.0 lb

## 2013-02-15 DIAGNOSIS — I712 Thoracic aortic aneurysm, without rupture: Secondary | ICD-10-CM

## 2013-02-15 DIAGNOSIS — J8410 Pulmonary fibrosis, unspecified: Secondary | ICD-10-CM

## 2013-02-15 DIAGNOSIS — J449 Chronic obstructive pulmonary disease, unspecified: Secondary | ICD-10-CM

## 2013-02-15 MED ORDER — TIOTROPIUM BROMIDE MONOHYDRATE 18 MCG IN CAPS: 18.0000 ug | ORAL_CAPSULE | Freq: Every day | RESPIRATORY_TRACT | Status: DC

## 2013-02-15 MED ORDER — LEVALBUTEROL HCL 0.63 MG/3ML IN NEBU: 0.6300 mg | INHALATION_SOLUTION | Freq: Four times a day (QID) | RESPIRATORY_TRACT | Status: DC | PRN
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
## 2013-02-15 NOTE — Assessment & Plan Note (Addendum)
Although he does not have 'true ' airway obstruction, clinically flares respond to steroids & ABx PFTs- 1/09 FVC 60%, ratio 81 - no airway obstructoin! Rpt spirometry 8/13 stable  Stay on symbicort /spiriva mucinex as needed only ONO on RA but his orthopnea seems to be related to CHF

## 2013-02-15 NOTE — Patient Instructions (Addendum)
Take higher dose of lasix if wt above 195 If you take higher dose > 7ds ,c all [place] for blood work Check oxygen level at night Use mucinex as needed only You have ascending aortic aneurysm 4.8 cm

## 2013-02-15 NOTE — Assessment & Plan Note (Signed)
Probably needs yearly FU - defer to cards

## 2013-02-18 ENCOUNTER — Telehealth: Payer: Self-pay | Admitting: Pulmonary Disease

## 2013-02-18 ENCOUNTER — Encounter: Payer: Self-pay | Admitting: *Deleted

## 2013-02-18 MED ORDER — TIOTROPIUM BROMIDE MONOHYDRATE 18 MCG IN CAPS: 18.0000 ug | ORAL_CAPSULE | Freq: Every day | RESPIRATORY_TRACT | Status: DC

## 2013-02-18 MED ORDER — LEVALBUTEROL HCL 0.63 MG/3ML IN NEBU: 0.6300 mg | INHALATION_SOLUTION | Freq: Four times a day (QID) | RESPIRATORY_TRACT | Status: DC | PRN

## 2013-02-18 NOTE — Telephone Encounter (Signed)
Letter done, rx printed and signed and faxed to number given above. Carron Curie, CMA

## 2013-02-18 NOTE — Telephone Encounter (Signed)
Pt's wife returned call. Leanora Ivanoff

## 2013-02-18 NOTE — Telephone Encounter (Signed)
[email or web address].Jennifer Castillo, CMA  

## 2013-02-18 NOTE — Telephone Encounter (Signed)
Ok for letter

## 2013-02-18 NOTE — Telephone Encounter (Signed)
I spoke with the pt spouse and she states [facility] is too expensive for pt xopenex and spiriva so they are in need of a letter stating the pt diagnosis and why he has to have xopenex and not albuterol(pt has a-fib) along with rx for both meds faxed to the [place] at the number given. Please advise if ok to do letter. Carron Curie, CMA

## 2013-02-18 NOTE — Telephone Encounter (Signed)
Get these meds filled through the [place] as it will be cheaper. Joe Jackson

## 2013-02-23 ENCOUNTER — Encounter (INDEPENDENT_AMBULATORY_CARE_PROVIDER_SITE_OTHER): Payer: Medicare PPO

## 2013-02-23 ENCOUNTER — Ambulatory Visit (INDEPENDENT_AMBULATORY_CARE_PROVIDER_SITE_OTHER): Payer: Medicare PPO | Admitting: *Deleted

## 2013-02-23 ENCOUNTER — Encounter: Payer: Self-pay | Admitting: Vascular Surgery

## 2013-02-23 ENCOUNTER — Telehealth: Payer: Self-pay

## 2013-02-23 ENCOUNTER — Ambulatory Visit: Payer: Medicare PPO | Admitting: Neurosurgery

## 2013-02-23 DIAGNOSIS — D696 Thrombocytopenia, unspecified: Secondary | ICD-10-CM

## 2013-02-23 DIAGNOSIS — I480 Paroxysmal atrial fibrillation: Secondary | ICD-10-CM

## 2013-02-23 DIAGNOSIS — I4891 Unspecified atrial fibrillation: Secondary | ICD-10-CM

## 2013-02-23 DIAGNOSIS — Z7901 Long term (current) use of anticoagulants: Secondary | ICD-10-CM

## 2013-02-23 DIAGNOSIS — I714 Abdominal aortic aneurysm, without rupture: Secondary | ICD-10-CM

## 2013-02-23 DIAGNOSIS — I219 Acute myocardial infarction, unspecified: Secondary | ICD-10-CM

## 2013-02-23 LAB — POCT INR: INR: 2.2

## 2013-02-23 NOTE — Telephone Encounter (Signed)
Joe Jackson, accompanied by his wife, came in for ultrasound of the aorta at [facility] on 02-23-2013.  After review of the patient's chart, previous studies at [facility] and a CT performed at the hospital in April, 2014, Dr. Edilia Bo recommended that Joe Jackson be placed on PRN status.  I called the patient to relay this information.  He will receive a letter from Dr. Edilia Bo.  The patient was told that he could follow-up with Dr. Edilia Bo in the future if needed.  The patient voiced his understanding and agreement with the PRN status for the ultrasound of the abdominal aorta.

## 2013-03-03 ENCOUNTER — Encounter: Payer: Self-pay | Admitting: Cardiology

## 2013-03-03 ENCOUNTER — Ambulatory Visit (INDEPENDENT_AMBULATORY_CARE_PROVIDER_SITE_OTHER): Payer: Medicare PPO | Admitting: Cardiology

## 2013-03-03 ENCOUNTER — Ambulatory Visit: Payer: Medicare PPO | Admitting: Cardiology

## 2013-03-03 VITALS — BP 112/56 | HR 90 | Ht 67.0 in | Wt 202.0 lb

## 2013-03-03 DIAGNOSIS — I714 Abdominal aortic aneurysm, without rupture, unspecified: Secondary | ICD-10-CM

## 2013-03-03 DIAGNOSIS — J8410 Pulmonary fibrosis, unspecified: Secondary | ICD-10-CM

## 2013-03-03 DIAGNOSIS — I2510 Atherosclerotic heart disease of native coronary artery without angina pectoris: Secondary | ICD-10-CM

## 2013-03-03 DIAGNOSIS — I509 Heart failure, unspecified: Secondary | ICD-10-CM

## 2013-03-03 DIAGNOSIS — I2589 Other forms of chronic ischemic heart disease: Secondary | ICD-10-CM

## 2013-03-03 DIAGNOSIS — I712 Thoracic aortic aneurysm, without rupture: Secondary | ICD-10-CM

## 2013-03-03 DIAGNOSIS — E785 Hyperlipidemia, unspecified: Secondary | ICD-10-CM

## 2013-03-03 DIAGNOSIS — I739 Peripheral vascular disease, unspecified: Secondary | ICD-10-CM

## 2013-03-03 DIAGNOSIS — I5042 Chronic combined systolic (congestive) and diastolic (congestive) heart failure: Secondary | ICD-10-CM

## 2013-03-03 DIAGNOSIS — I4891 Unspecified atrial fibrillation: Secondary | ICD-10-CM

## 2013-03-03 DIAGNOSIS — I7121 Aneurysm of the ascending aorta, without rupture: Secondary | ICD-10-CM

## 2013-03-03 DIAGNOSIS — I255 Ischemic cardiomyopathy: Secondary | ICD-10-CM

## 2013-03-03 MED ORDER — FUROSEMIDE 40 MG PO TABS: ORAL_TABLET | ORAL | Status: DC

## 2013-03-03 MED ORDER — PRAVASTATIN SODIUM 80 MG PO TABS: 80.0000 mg | ORAL_TABLET | Freq: Every evening | ORAL | Status: DC

## 2013-03-03 NOTE — Patient Instructions (Addendum)
Increase lasix (furosemide) to 80mg  daily. This will be 2 of your 40mg  tablets daily at the same time in the morning.   Start pravachol 80mg  daily in the evening.   Your physician has requested that you have a lower extremity arterial duplex. This test is an ultrasound of the arteries in the legs. It looks at arterial blood flow in the legs. Allow one hour for Lower Arterial scans. There are no restrictions or special instructions.  Your physician recommends that you have lab in 1 week--BMEt/BNP. You have the order. Please fax the results to Dr Shirlee Latch [phone].   Your physician recommends that you schedule a follow-up appointment in: 1 month with Dr Shirlee Latch  Your physician recommends that you return for a FASTING lipid profile /liver profile in 2 months.
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
## 2013-03-07 ENCOUNTER — Other Ambulatory Visit: Payer: Self-pay | Admitting: *Deleted

## 2013-03-07 ENCOUNTER — Encounter (INDEPENDENT_AMBULATORY_CARE_PROVIDER_SITE_OTHER): Payer: Medicare PPO

## 2013-03-07 ENCOUNTER — Ambulatory Visit (INDEPENDENT_AMBULATORY_CARE_PROVIDER_SITE_OTHER): Payer: Medicare PPO | Admitting: *Deleted

## 2013-03-07 DIAGNOSIS — Z7901 Long term (current) use of anticoagulants: Secondary | ICD-10-CM

## 2013-03-07 DIAGNOSIS — I4891 Unspecified atrial fibrillation: Secondary | ICD-10-CM

## 2013-03-07 DIAGNOSIS — I739 Peripheral vascular disease, unspecified: Secondary | ICD-10-CM

## 2013-03-07 DIAGNOSIS — I480 Paroxysmal atrial fibrillation: Secondary | ICD-10-CM

## 2013-03-07 DIAGNOSIS — D696 Thrombocytopenia, unspecified: Secondary | ICD-10-CM

## 2013-03-07 DIAGNOSIS — I70219 Atherosclerosis of native arteries of extremities with intermittent claudication, unspecified extremity: Secondary | ICD-10-CM

## 2013-03-07 DIAGNOSIS — I219 Acute myocardial infarction, unspecified: Secondary | ICD-10-CM

## 2013-03-07 LAB — POCT INR: INR: 2.4

## 2013-03-07 MED ORDER — PRAVASTATIN SODIUM 80 MG PO TABS: 80.0000 mg | ORAL_TABLET | Freq: Every evening | ORAL | Status: DC

## 2013-03-08 ENCOUNTER — Telehealth: Payer: Self-pay | Admitting: Cardiology

## 2013-03-08 NOTE — Telephone Encounter (Signed)
Follow up  [name] said she is returning your call.

## 2013-03-08 NOTE — Telephone Encounter (Signed)
Spoke with patient's wife about recent LEA

## 2013-03-09 ENCOUNTER — Other Ambulatory Visit: Payer: Self-pay

## 2013-03-28 ENCOUNTER — Ambulatory Visit (INDEPENDENT_AMBULATORY_CARE_PROVIDER_SITE_OTHER): Payer: Medicare PPO | Admitting: Cardiology

## 2013-03-28 ENCOUNTER — Ambulatory Visit (INDEPENDENT_AMBULATORY_CARE_PROVIDER_SITE_OTHER): Payer: Medicare PPO | Admitting: *Deleted

## 2013-03-28 ENCOUNTER — Other Ambulatory Visit: Payer: Self-pay | Admitting: Cardiology

## 2013-03-28 ENCOUNTER — Encounter: Payer: Self-pay | Admitting: Cardiology

## 2013-03-28 VITALS — BP 110/68 | HR 81 | Ht 67.0 in | Wt 205.0 lb

## 2013-03-28 DIAGNOSIS — Z7901 Long term (current) use of anticoagulants: Secondary | ICD-10-CM

## 2013-03-28 DIAGNOSIS — I5042 Chronic combined systolic (congestive) and diastolic (congestive) heart failure: Secondary | ICD-10-CM

## 2013-03-28 DIAGNOSIS — I712 Thoracic aortic aneurysm, without rupture, unspecified: Secondary | ICD-10-CM

## 2013-03-28 DIAGNOSIS — N189 Chronic kidney disease, unspecified: Secondary | ICD-10-CM

## 2013-03-28 DIAGNOSIS — D696 Thrombocytopenia, unspecified: Secondary | ICD-10-CM

## 2013-03-28 DIAGNOSIS — I480 Paroxysmal atrial fibrillation: Secondary | ICD-10-CM

## 2013-03-28 DIAGNOSIS — J8410 Pulmonary fibrosis, unspecified: Secondary | ICD-10-CM

## 2013-03-28 DIAGNOSIS — I219 Acute myocardial infarction, unspecified: Secondary | ICD-10-CM

## 2013-03-28 DIAGNOSIS — I4891 Unspecified atrial fibrillation: Secondary | ICD-10-CM

## 2013-03-28 DIAGNOSIS — I2510 Atherosclerotic heart disease of native coronary artery without angina pectoris: Secondary | ICD-10-CM

## 2013-03-28 DIAGNOSIS — E785 Hyperlipidemia, unspecified: Secondary | ICD-10-CM

## 2013-03-28 DIAGNOSIS — I739 Peripheral vascular disease, unspecified: Secondary | ICD-10-CM

## 2013-03-28 DIAGNOSIS — I509 Heart failure, unspecified: Secondary | ICD-10-CM

## 2013-03-28 MED ORDER — FUROSEMIDE 40 MG PO TABS: ORAL_TABLET | ORAL | Status: DC
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
## 2013-03-28 NOTE — Patient Instructions (Addendum)
Increase lasix (furosemide) to 60mg  two times a day. This will be 1 and 1/2 of a 40mg  tablet two times a day.   Your physician recommends that you return for lab work in: 1 week--BMET/BNP.  Your physician recommends that you schedule a follow-up appointment on Wednesday October 1,2014 at 9:15AM.  You are also scheduled for fasting lab Wednesday October 1,2014.

## 2013-04-05 ENCOUNTER — Other Ambulatory Visit (INDEPENDENT_AMBULATORY_CARE_PROVIDER_SITE_OTHER): Payer: Medicare PPO

## 2013-04-05 DIAGNOSIS — E119 Type 2 diabetes mellitus without complications: Secondary | ICD-10-CM

## 2013-04-05 DIAGNOSIS — E785 Hyperlipidemia, unspecified: Secondary | ICD-10-CM

## 2013-04-05 LAB — LIPID PANEL
Cholesterol: 151 mg/dL (ref 0–200)
HDL: 31.1 mg/dL — ABNORMAL LOW (ref >39.00)
LDL Cholesterol: 88 mg/dL (ref 0–99)
VLDL: 31.6 mg/dL (ref 0.0–40.0)

## 2013-04-05 LAB — BASIC METABOLIC PANEL
CO2: 29 mEq/L (ref 19–32)
Chloride: 102 mEq/L (ref 96–112)
Creatinine, Ser: 1.9 mg/dL — ABNORMAL HIGH (ref 0.4–1.5)
Potassium: 3.7 mEq/L (ref 3.5–5.1)
Sodium: 136 mEq/L (ref 135–145)

## 2013-04-05 LAB — HEPATIC FUNCTION PANEL
ALT: 16 U/L (ref 0–53)
Albumin: 3.8 g/dL (ref 3.5–5.2)
Bilirubin, Direct: 0.1 mg/dL (ref 0.0–0.3)
Total Protein: 7.3 g/dL (ref 6.0–8.3)

## 2013-04-19 ENCOUNTER — Encounter: Payer: Self-pay | Admitting: Adult Health

## 2013-04-19 ENCOUNTER — Ambulatory Visit (INDEPENDENT_AMBULATORY_CARE_PROVIDER_SITE_OTHER): Payer: Medicare PPO | Admitting: Adult Health

## 2013-04-19 VITALS — BP 124/66 | HR 85 | Temp 98.5°F | Ht 67.0 in | Wt 206.0 lb

## 2013-04-19 DIAGNOSIS — Z23 Encounter for immunization: Secondary | ICD-10-CM

## 2013-04-19 DIAGNOSIS — J8410 Pulmonary fibrosis, unspecified: Secondary | ICD-10-CM

## 2013-04-19 DIAGNOSIS — J4489 Other specified chronic obstructive pulmonary disease: Secondary | ICD-10-CM

## 2013-04-19 DIAGNOSIS — I509 Heart failure, unspecified: Secondary | ICD-10-CM

## 2013-04-19 DIAGNOSIS — J449 Chronic obstructive pulmonary disease, unspecified: Secondary | ICD-10-CM

## 2013-04-19 NOTE — Assessment & Plan Note (Signed)
Stable without flare  Cont on current regimen  Flu shot today

## 2013-04-19 NOTE — Patient Instructions (Signed)
Continue on current regimen  Flu shot today  Follow up Dr. Vassie Loll  In 2 months and As needed
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
## 2013-04-19 NOTE — Assessment & Plan Note (Signed)
Appears compensated without flare  Cont on low salt diet

## 2013-04-19 NOTE — Addendum Note (Signed)
Addended by: Tommie Sams on: 04/19/2013 09:40 AM   Modules accepted: Orders

## 2013-04-19 NOTE — Assessment & Plan Note (Signed)
Stable without flare   

## 2013-04-28 ENCOUNTER — Ambulatory Visit (INDEPENDENT_AMBULATORY_CARE_PROVIDER_SITE_OTHER): Payer: Medicare PPO | Admitting: *Deleted

## 2013-04-28 DIAGNOSIS — I219 Acute myocardial infarction, unspecified: Secondary | ICD-10-CM

## 2013-04-28 DIAGNOSIS — D696 Thrombocytopenia, unspecified: Secondary | ICD-10-CM

## 2013-04-28 DIAGNOSIS — Z7901 Long term (current) use of anticoagulants: Secondary | ICD-10-CM

## 2013-04-28 DIAGNOSIS — I480 Paroxysmal atrial fibrillation: Secondary | ICD-10-CM

## 2013-04-28 DIAGNOSIS — I4891 Unspecified atrial fibrillation: Secondary | ICD-10-CM

## 2013-04-28 LAB — POCT INR: INR: 2.9

## 2013-05-04 ENCOUNTER — Other Ambulatory Visit: Payer: Medicare PPO

## 2013-05-04 ENCOUNTER — Ambulatory Visit: Payer: Medicare PPO | Admitting: Cardiology

## 2013-05-04 ENCOUNTER — Telehealth: Payer: Self-pay | Admitting: Cardiology

## 2013-05-04 NOTE — Telephone Encounter (Signed)
New Problem:  Pt's wife states she would like pt's blood work set up with Dr. Ernst Spell office. Please call pt and let them know when and if the lab can be scheduled for Dr. Ernst Spell office on [address].

## 2013-05-04 NOTE — Telephone Encounter (Signed)
LMTCB

## 2013-05-09 ENCOUNTER — Encounter: Payer: Self-pay | Admitting: *Deleted

## 2013-05-12 ENCOUNTER — Encounter: Payer: Self-pay | Admitting: Family Medicine

## 2013-05-12 ENCOUNTER — Encounter: Payer: Self-pay | Admitting: Lab

## 2013-05-12 ENCOUNTER — Ambulatory Visit (INDEPENDENT_AMBULATORY_CARE_PROVIDER_SITE_OTHER): Payer: Medicare PPO | Admitting: Family Medicine

## 2013-05-12 VITALS — BP 122/74 | HR 72 | Temp 98.1°F | Wt 205.0 lb

## 2013-05-12 DIAGNOSIS — M109 Gout, unspecified: Secondary | ICD-10-CM

## 2013-05-12 DIAGNOSIS — L0390 Cellulitis, unspecified: Secondary | ICD-10-CM

## 2013-05-12 DIAGNOSIS — L0291 Cutaneous abscess, unspecified: Secondary | ICD-10-CM

## 2013-05-12 LAB — CBC WITH DIFFERENTIAL/PLATELET
Basophils Relative: 0.7 % (ref 0.0–3.0)
Eosinophils Absolute: 0.6 10*3/uL (ref 0.0–0.7)
Eosinophils Relative: 9.6 % — ABNORMAL HIGH (ref 0.0–5.0)
HCT: 34.4 % — ABNORMAL LOW (ref 39.0–52.0)
Hemoglobin: 11 g/dL — ABNORMAL LOW (ref 13.0–17.0)
MCHC: 32 g/dL (ref 30.0–36.0)
MCV: 81 fl (ref 78.0–100.0)
Monocytes Absolute: 0.5 10*3/uL (ref 0.1–1.0)
Neutro Abs: 3.8 10*3/uL (ref 1.4–7.7)
RBC: 4.25 Mil/uL (ref 4.22–5.81)

## 2013-05-12 LAB — BASIC METABOLIC PANEL
CO2: 29 mEq/L (ref 19–32)
Chloride: 104 mEq/L (ref 96–112)
Potassium: 3.5 mEq/L (ref 3.5–5.1)
Sodium: 142 mEq/L (ref 135–145)

## 2013-05-12 MED ORDER — TRAMADOL HCL 50 MG PO TABS: 50.0000 mg | ORAL_TABLET | Freq: Three times a day (TID) | ORAL | Status: DC | PRN

## 2013-05-12 MED ORDER — CEPHALEXIN 500 MG PO CAPS: 500.0000 mg | ORAL_CAPSULE | Freq: Two times a day (BID) | ORAL | Status: DC

## 2013-05-12 NOTE — Patient Instructions (Signed)
Gout  Gout is an inflammatory condition (arthritis) caused by a buildup of uric acid crystals in the joints. Uric acid is a chemical that is normally present in the blood. Under some circumstances, uric acid can form into crystals in your joints. This causes joint redness, soreness, and swelling (inflammation). Repeat attacks are common. Over time, uric acid crystals can form into masses (tophi) near a joint, causing disfigurement. Gout is treatable and often preventable.  CAUSES   The disease begins with elevated levels of uric acid in the blood. Uric acid is produced by your body when it breaks down a naturally found substance called purines. This also happens when you eat certain foods such as meats and fish. Causes of an elevated uric acid level include:   Being passed down from parent to child (heredity).   Diseases that cause increased uric acid production (obesity, psoriasis, some cancers).   Excessive alcohol use.   Diet, especially diets rich in meat and seafood.   Medicines, including certain cancer-fighting drugs (chemotherapy), diuretics, and aspirin.   Chronic kidney disease. The kidneys are no longer able to remove uric acid well.   Problems with metabolism.  Conditions strongly associated with gout include:   Obesity.   High blood pressure.   High cholesterol.   Diabetes.  Not everyone with elevated uric acid levels gets gout. It is not understood why some people get gout and others do not. Surgery, joint injury, and eating too much of certain foods are some of the factors that can lead to gout.  SYMPTOMS    An attack of gout comes on quickly. It causes intense pain with redness, swelling, and warmth in a joint.   Fever can occur.   Often, only one joint is involved. Certain joints are more commonly involved:   Base of the big toe.   Knee.   Ankle.   Wrist.   Finger.  Without treatment, an attack usually goes away in a few days to weeks. Between attacks, you usually will not have  symptoms, which is different from many other forms of arthritis.  DIAGNOSIS   Your caregiver will suspect gout based on your symptoms and exam. Removal of fluid from the joint (arthrocentesis) is done to check for uric acid crystals. Your caregiver will give you a medicine that numbs the area (local anesthetic) and use a needle to remove joint fluid for exam. Gout is confirmed when uric acid crystals are seen in joint fluid, using a special microscope. Sometimes, blood, urine, and X-ray tests are also used.  TREATMENT   There are 2 phases to gout treatment: treating the sudden onset (acute) attack and preventing attacks (prophylaxis).  Treatment of an Acute Attack   Medicines are used. These include anti-inflammatory medicines or steroid medicines.   An injection of steroid medicine into the affected joint is sometimes necessary.   The painful joint is rested. Movement can worsen the arthritis.   You may use warm or cold treatments on painful joints, depending which works best for you.   Discuss the use of coffee, vitamin C, or cherries with your caregiver. These may be helpful treatment options.  Treatment to Prevent Attacks  After the acute attack subsides, your caregiver may advise prophylactic medicine. These medicines either help your kidneys eliminate uric acid from your body or decrease your uric acid production. You may need to stay on these medicines for a very long time.  The early phase of treatment with prophylactic medicine can be associated   with an increase in acute gout attacks. For this reason, during the first few months of treatment, your caregiver may also advise you to take medicines usually used for acute gout treatment. Be sure you understand your caregiver's directions.  You should also discuss dietary treatment with your caregiver. Certain foods such as meats and fish can increase uric acid levels. Other foods such as dairy can decrease levels. Your caregiver can give you a list of foods  to avoid.  HOME CARE INSTRUCTIONS    Do not take aspirin to relieve pain. This raises uric acid levels.   Only take over-the-counter or prescription medicines for pain, discomfort, or fever as directed by your caregiver.   Rest the joint as much as possible. When in bed, keep sheets and blankets off painful areas.   Keep the affected joint raised (elevated).   Use crutches if the painful joint is in your leg.   Drink enough water and fluids to keep your urine clear or pale yellow. This helps your body get rid of uric acid. Do not drink alcoholic beverages. They slow the passage of uric acid.   Follow your caregiver's dietary instructions. Pay careful attention to the amount of protein you eat. Your daily diet should emphasize fruits, vegetables, whole grains, and fat-free or low-fat milk products.   Maintain a healthy body weight.  SEEK MEDICAL CARE IF:    You have an oral temperature above 102 F (38.9 C).   You develop diarrhea, vomiting, or any side effects from medicines.   You do not feel better in 24 hours, or you are getting worse.  SEEK IMMEDIATE MEDICAL CARE IF:    Your joint becomes suddenly more tender and you have:   Chills.   An oral temperature above 102 F (38.9 C), not controlled by medicine.  MAKE SURE YOU:    Understand these instructions.   Will watch your condition.   Will get help right away if you are not doing well or get worse.  Document Released: 07/18/2000 Document Revised: 10/13/2011 Document Reviewed: 10/29/2009  [employer] Patient Information 2014 [employer].

## 2013-05-12 NOTE — Assessment & Plan Note (Signed)
Check labs Ultram for pain abx secondary to area being hot--- ? Cellulitis HO given to patient

## 2013-05-12 NOTE — Progress Notes (Signed)
  Subjective:    Patient ID: Joe Jackson, male    DOB: 01-26-1930, 78 y.o.   MRN: [id number]  HPI Pt here c/o 1st and 2nd toe on r foot being swollen, hot and very painful.   Pt states "I think I got that gout" Pt denies injury.      Review of Systems As above    Objective:   Physical Exam BP 122/74  Pulse 72  Temp(Src) 98.1 F (36.7 C) (Oral)  Wt 205 lb (92.987 kg)  BMI 32.1 kg/m2  SpO2 93% General appearance: alert, cooperative, appears stated age and no distress Extremities: first and second toe onR foot--- swollen and red , painful, hot        Assessment & Plan:

## 2013-05-13 ENCOUNTER — Other Ambulatory Visit (INDEPENDENT_AMBULATORY_CARE_PROVIDER_SITE_OTHER): Payer: Medicare FFS

## 2013-05-13 ENCOUNTER — Telehealth: Payer: Self-pay

## 2013-05-13 ENCOUNTER — Telehealth: Payer: Self-pay | Admitting: Family Medicine

## 2013-05-13 DIAGNOSIS — E785 Hyperlipidemia, unspecified: Secondary | ICD-10-CM

## 2013-05-13 DIAGNOSIS — I4891 Unspecified atrial fibrillation: Secondary | ICD-10-CM

## 2013-05-13 DIAGNOSIS — M109 Gout, unspecified: Secondary | ICD-10-CM

## 2013-05-13 DIAGNOSIS — I10 Essential (primary) hypertension: Secondary | ICD-10-CM

## 2013-05-13 DIAGNOSIS — I509 Heart failure, unspecified: Secondary | ICD-10-CM

## 2013-05-13 DIAGNOSIS — I739 Peripheral vascular disease, unspecified: Secondary | ICD-10-CM

## 2013-05-13 LAB — HEPATIC FUNCTION PANEL
ALT: 14 U/L (ref 0–53)
AST: 22 U/L (ref 0–37)
Albumin: 4.1 g/dL (ref 3.5–5.2)
Total Bilirubin: 0.6 mg/dL (ref 0.3–1.2)
Total Protein: 7.6 g/dL (ref 6.0–8.3)

## 2013-05-13 LAB — BASIC METABOLIC PANEL
BUN: 42 mg/dL — ABNORMAL HIGH (ref 6–23)
CO2: 27 mEq/L (ref 19–32)
Calcium: 8.9 mg/dL (ref 8.4–10.5)
GFR: 34.6 mL/min — ABNORMAL LOW (ref >60.00)
Glucose, Bld: 100 mg/dL — ABNORMAL HIGH (ref 70–99)

## 2013-05-13 LAB — LIPID PANEL
Total CHOL/HDL Ratio: 5
VLDL: 25.6 mg/dL (ref 0.0–40.0)

## 2013-05-13 MED ORDER — FEBUXOSTAT 40 MG PO TABS: 40.0000 mg | ORAL_TABLET | Freq: Every day | ORAL | Status: DC

## 2013-05-13 MED ORDER — COLCHICINE 0.6 MG PO TABS: ORAL_TABLET | ORAL | Status: DC

## 2013-05-13 NOTE — Telephone Encounter (Signed)
Patient's daughter is calling because she states that the Uloric rx that was sent to [facility] for the patient ended up being $300 which the patient cannot afford. She says that the pharmacy was unable to recommend another rx that was similar. Patient's daughter states that he is in a lot of pain and wants to know what else can be called in for him. Please advise.

## 2013-05-13 NOTE — Telephone Encounter (Signed)
Please advise      KP 

## 2013-05-13 NOTE — Telephone Encounter (Signed)
Detailed msg left on Rosa's VM advising new Rx sent.      KP

## 2013-05-13 NOTE — Telephone Encounter (Signed)
Discussed with patient and his wife when they came into the office for labs. They both voiced understanding and the medication has been sent.     KP

## 2013-05-13 NOTE — Telephone Encounter (Signed)
Message copied by Arnette Norris on Fri May 13, 2013  9:32 AM ------      Message from: Lelon Perla      Created: Thu May 12, 2013  4:56 PM       Uric acid elevated---- goal < 6      uloric 40 mg 1 po qd  #30  2 refills -----repeat uric acid in 2 weeks  Dx gout      Forward to nephrology ------

## 2013-05-13 NOTE — Telephone Encounter (Signed)
We can not use allopurinol because of kidney function Colchicine 0.6 mg   2po x1 then 1 po 1 hr later Then 1 po qd  #31  2 refills

## 2013-05-17 ENCOUNTER — Other Ambulatory Visit: Payer: Self-pay | Admitting: *Deleted

## 2013-05-17 DIAGNOSIS — I10 Essential (primary) hypertension: Secondary | ICD-10-CM

## 2013-05-24 ENCOUNTER — Encounter: Payer: Self-pay | Admitting: Cardiology

## 2013-05-24 ENCOUNTER — Ambulatory Visit (INDEPENDENT_AMBULATORY_CARE_PROVIDER_SITE_OTHER): Payer: Medicare PPO | Admitting: *Deleted

## 2013-05-24 ENCOUNTER — Ambulatory Visit (INDEPENDENT_AMBULATORY_CARE_PROVIDER_SITE_OTHER): Payer: Medicare PPO | Admitting: Cardiology

## 2013-05-24 VITALS — BP 132/84 | HR 68 | Ht 67.0 in

## 2013-05-24 DIAGNOSIS — I4891 Unspecified atrial fibrillation: Secondary | ICD-10-CM

## 2013-05-24 DIAGNOSIS — I509 Heart failure, unspecified: Secondary | ICD-10-CM

## 2013-05-24 DIAGNOSIS — I739 Peripheral vascular disease, unspecified: Secondary | ICD-10-CM

## 2013-05-24 DIAGNOSIS — I219 Acute myocardial infarction, unspecified: Secondary | ICD-10-CM

## 2013-05-24 DIAGNOSIS — I714 Abdominal aortic aneurysm, without rupture: Secondary | ICD-10-CM

## 2013-05-24 DIAGNOSIS — I5042 Chronic combined systolic (congestive) and diastolic (congestive) heart failure: Secondary | ICD-10-CM

## 2013-05-24 DIAGNOSIS — I480 Paroxysmal atrial fibrillation: Secondary | ICD-10-CM

## 2013-05-24 DIAGNOSIS — I4819 Other persistent atrial fibrillation: Secondary | ICD-10-CM

## 2013-05-24 DIAGNOSIS — Z7901 Long term (current) use of anticoagulants: Secondary | ICD-10-CM

## 2013-05-24 DIAGNOSIS — D696 Thrombocytopenia, unspecified: Secondary | ICD-10-CM

## 2013-05-24 DIAGNOSIS — I2510 Atherosclerotic heart disease of native coronary artery without angina pectoris: Secondary | ICD-10-CM

## 2013-05-24 LAB — POCT INR: INR: 3.7

## 2013-05-24 MED ORDER — FUROSEMIDE 40 MG PO TABS: ORAL_TABLET | ORAL | Status: DC
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
## 2013-05-24 NOTE — Patient Instructions (Signed)
Your physician recommends that you return for lab work in about 6 weeks--BMET.  Your physician recommends that you schedule a follow-up appointment in: 3 months with Dr Shirlee Latch at the [facility].

## 2013-06-06 ENCOUNTER — Ambulatory Visit (INDEPENDENT_AMBULATORY_CARE_PROVIDER_SITE_OTHER): Payer: Medicare PPO | Admitting: General Practice

## 2013-06-06 DIAGNOSIS — Z7901 Long term (current) use of anticoagulants: Secondary | ICD-10-CM

## 2013-06-06 DIAGNOSIS — I219 Acute myocardial infarction, unspecified: Secondary | ICD-10-CM

## 2013-06-06 DIAGNOSIS — D696 Thrombocytopenia, unspecified: Secondary | ICD-10-CM

## 2013-06-06 DIAGNOSIS — I4891 Unspecified atrial fibrillation: Secondary | ICD-10-CM

## 2013-06-06 DIAGNOSIS — I480 Paroxysmal atrial fibrillation: Secondary | ICD-10-CM

## 2013-06-06 LAB — POCT INR: INR: 2.8

## 2013-06-19 ENCOUNTER — Emergency Department (HOSPITAL_BASED_OUTPATIENT_CLINIC_OR_DEPARTMENT_OTHER): Payer: Medicare PPO

## 2013-06-19 ENCOUNTER — Encounter (HOSPITAL_BASED_OUTPATIENT_CLINIC_OR_DEPARTMENT_OTHER): Payer: Self-pay | Admitting: Emergency Medicine

## 2013-06-19 ENCOUNTER — Emergency Department (HOSPITAL_BASED_OUTPATIENT_CLINIC_OR_DEPARTMENT_OTHER)
Admission: EM | Admit: 2013-06-19 | Discharge: 2013-06-19 | Disposition: A | Discharge status: Home / Self Care | Payer: Medicare PPO | Attending: Emergency Medicine | Admitting: Emergency Medicine

## 2013-06-19 DIAGNOSIS — Z8709 Personal history of other diseases of the respiratory system: Secondary | ICD-10-CM

## 2013-06-19 DIAGNOSIS — I2510 Atherosclerotic heart disease of native coronary artery without angina pectoris: Secondary | ICD-10-CM | POA: Insufficient documentation

## 2013-06-19 DIAGNOSIS — J029 Acute pharyngitis, unspecified: Secondary | ICD-10-CM | POA: Insufficient documentation

## 2013-06-19 DIAGNOSIS — J4489 Other specified chronic obstructive pulmonary disease: Secondary | ICD-10-CM | POA: Insufficient documentation

## 2013-06-19 DIAGNOSIS — E785 Hyperlipidemia, unspecified: Secondary | ICD-10-CM | POA: Insufficient documentation

## 2013-06-19 DIAGNOSIS — Z792 Long term (current) use of antibiotics: Secondary | ICD-10-CM | POA: Insufficient documentation

## 2013-06-19 DIAGNOSIS — K219 Gastro-esophageal reflux disease without esophagitis: Secondary | ICD-10-CM | POA: Insufficient documentation

## 2013-06-19 DIAGNOSIS — J40 Bronchitis, not specified as acute or chronic: Secondary | ICD-10-CM

## 2013-06-19 DIAGNOSIS — Z7901 Long term (current) use of anticoagulants: Secondary | ICD-10-CM | POA: Insufficient documentation

## 2013-06-19 DIAGNOSIS — I10 Essential (primary) hypertension: Secondary | ICD-10-CM | POA: Insufficient documentation

## 2013-06-19 DIAGNOSIS — I5042 Chronic combined systolic (congestive) and diastolic (congestive) heart failure: Secondary | ICD-10-CM | POA: Insufficient documentation

## 2013-06-19 DIAGNOSIS — J3489 Other specified disorders of nose and nasal sinuses: Secondary | ICD-10-CM | POA: Insufficient documentation

## 2013-06-19 DIAGNOSIS — I4891 Unspecified atrial fibrillation: Secondary | ICD-10-CM | POA: Insufficient documentation

## 2013-06-19 DIAGNOSIS — Z79899 Other long term (current) drug therapy: Secondary | ICD-10-CM | POA: Insufficient documentation

## 2013-06-19 DIAGNOSIS — J449 Chronic obstructive pulmonary disease, unspecified: Secondary | ICD-10-CM | POA: Insufficient documentation

## 2013-06-19 DIAGNOSIS — Z87891 Personal history of nicotine dependence: Secondary | ICD-10-CM | POA: Insufficient documentation

## 2013-06-19 MED ORDER — HYDROCOD POLST-CHLORPHEN POLST 10-8 MG/5ML PO LQCR: 5.0000 mL | Freq: Two times a day (BID) | ORAL | Status: DC | PRN

## 2013-06-19 MED ORDER — HYDROCOD POLST-CHLORPHEN POLST 10-8 MG/5ML PO LQCR
5.0000 mL | Freq: Once | ORAL | Status: AC
  Administered 2013-06-19: 5 mL via ORAL
  Filled 2013-06-19: qty 5

## 2013-06-19 MED ORDER — ALBUTEROL SULFATE (5 MG/ML) 0.5% IN NEBU: 5.0000 mg | INHALATION_SOLUTION | Freq: Once | RESPIRATORY_TRACT | Status: DC

## 2013-06-19 MED ORDER — AZITHROMYCIN 250 MG PO TABS: 250.0000 mg | ORAL_TABLET | Freq: Every day | ORAL | Status: DC

## 2013-06-19 MED ORDER — IPRATROPIUM BROMIDE 0.02 % IN SOLN
0.5000 mg | Freq: Once | RESPIRATORY_TRACT | Status: AC
  Administered 2013-06-19: 0.5 mg via RESPIRATORY_TRACT
  Filled 2013-06-19: qty 2.5

## 2013-06-19 MED ORDER — LEVALBUTEROL HCL 1.25 MG/3ML IN NEBU
1.2500 mg | INHALATION_SOLUTION | Freq: Once | RESPIRATORY_TRACT | Status: AC
  Administered 2013-06-19: 1.25 mg via RESPIRATORY_TRACT
  Filled 2013-06-19: qty 0.5

## 2013-06-19 MED ORDER — PREDNISONE 20 MG PO TABS: 60.0000 mg | ORAL_TABLET | Freq: Every day | ORAL | Status: DC

## 2013-06-19 NOTE — ED Provider Notes (Signed)
CSN: [id number]     Arrival date & time 06/19/13  1111 History   First MD Initiated Contact with Patient 06/19/13 1202     Chief Complaint  Patient presents with  . Cough   (Consider location/radiation/quality/duration/timing/severity/associated sxs/prior Treatment) Patient is a 79 y.o. male presenting with cough. The history is provided by the patient. No language interpreter was used.  Cough Cough characteristics:  Productive Sputum characteristics:  Manson Passey Severity:  Moderate Worsened by:  Lying down Associated symptoms: rhinorrhea and sore throat   Associated symptoms: no chest pain, no chills, no fever, no myalgias, no rash and no shortness of breath   Associated symptoms comment:  Productive cough for the past 3 days without known fever. He complains of upper abdominal soreness now with cough but denies chest pain or shortness of breath. He has a history of emphysema, on Xopenex at home without relief of cough. Mild nasal congestion without sore throat.   Past Medical History  Diagnosis Date  . Hypertension   . Hyperlipidemia   . Chronic combined systolic and diastolic CHF (congestive heart failure)     a. 10/2012 Echo: EF 40-45%, Gr1 dd, postlat AK, mild to mod AS, Triv AI, Mild MR, mildly dil LA.  . Sleep apnea   . Aortic aneurysm, abdominal     f/u by [facility]  . Pulmonary fibrosis   . Ischemic cardiomyopathy     a. EF 40-45% by echo 10/2012  . CAD (coronary artery disease)     a. s/p prior MI;  b. 11/1999 Cath: LM nl, LAD 36m, LCX min irregs, OM 100, RCA 80p, 100d, L->L and L->R collats, EF 54%.  Marland Kitchen COPD (chronic obstructive pulmonary disease)   . PAF (paroxysmal atrial fibrillation)     a. Dx 10/2012 during hosp for resp failure/copd flare  . Aortic stenosis     mild to mod by echo 10/2012 (mean gradient 14 mmHg)  . Shortness of breath   . GERD (gastroesophageal reflux disease)    Past Surgical History  Procedure Laterality Date  . Neck surgery    . Arm surgery    . Back  surgery    . Leg surgery    . Cardioversion N/A 01/20/2013    Procedure: CARDIOVERSION;  Surgeon: Cassell Clement, MD;  Location: [facility] ENDOSCOPY;  Service: Cardiovascular;  Laterality: N/A;   Family History  Problem Relation Age of Onset  . Heart disease Mother     Heart Disease before age  81  . Other Mother     Rheumatism  . Heart attack Mother   . Heart attack Father    History  Substance Use Topics  . Smoking status: Former Smoker -- 0.80 packs/day for 78 years    Types: Cigarettes    Quit date: 08/05/2011  . Smokeless tobacco: Current User    Types: Chew    Last Attempt to Quit: 08/11/2011  . Alcohol Use: No    Review of Systems  Constitutional: Negative for fever and chills.  HENT: Positive for congestion, rhinorrhea and sore throat.   Respiratory: Positive for cough. Negative for shortness of breath.   Cardiovascular: Negative for chest pain.       Chest pain with cough.  Gastrointestinal: Negative.  Negative for nausea and vomiting.  Musculoskeletal: Negative.  Negative for myalgias.  Skin: Negative.  Negative for rash.  Neurological: Negative.     Allergies  Ceftriaxone sodium in dextrose  Home Medications   Current Outpatient Rx  Name  Route  Sig  Dispense  Refill  . bisoprolol (ZEBETA) 10 MG tablet   Oral   Take 1 tablet (10 mg total) by mouth daily.   90 tablet   3   . budesonide-formoterol (SYMBICORT) 160-4.5 MCG/ACT inhaler   Inhalation   Inhale 2 puffs into the lungs 2 (two) times daily.   1 Inhaler   11   . cephALEXin (KEFLEX) 500 MG capsule   Oral   Take 1 capsule (500 mg total) by mouth 2 (two) times daily.   20 capsule   0   . cetirizine (ZYRTEC) 10 MG tablet   Oral   Take 10 mg by mouth daily.           . Coenzyme Q10 (CO Q 10 PO)   Oral   Take 1 tablet by mouth daily.          . colchicine 0.6 MG tablet      2 by mouth now, then 1 an hour later. Then 1 po daily   31 tablet   2   . Cyanocobalamin (VITAMIN B-12 PO)    Oral   Take 1 tablet by mouth daily.          Marland Kitchen docusate sodium (COLACE) 100 MG capsule   Oral   Take 100 mg by mouth daily.         . furosemide (LASIX) 40 MG tablet      1 and 1/2 tablets (total 60mg  ) two times a day   270 tablet   3   . Garlic (ODOR FREE GARLIC) 100 MG TABS   Oral   Take 1 tablet by mouth daily.           Marland Kitchen guaiFENesin (MUCINEX) 600 MG 12 hr tablet   Oral   Take 1,200 mg by mouth as needed.          . levalbuterol (XOPENEX) 0.63 MG/3ML nebulizer solution   Nebulization   Take 3 mLs (0.63 mg total) by nebulization every 6 (six) hours as needed for wheezing or shortness of breath. DX 515   1080 mL   1   . losartan (COZAAR) 25 MG tablet   Oral   Take 0.5 tablets (12.5 mg total) by mouth daily.         . Multiple Vitamin (MULITIVITAMIN WITH MINERALS) TABS   Oral   Take 1 tablet by mouth daily.         . Omega-3 Fatty Acids (FISH OIL PO)   Oral   Take 1 capsule by mouth daily.          Marland Kitchen omeprazole (PRILOSEC) 20 MG capsule   Oral   Take 1 capsule (20 mg total) by mouth daily.   90 capsule   1   . pravastatin (PRAVACHOL) 80 MG tablet   Oral   Take 1 tablet (80 mg total) by mouth every evening.   30 tablet   3   . tiotropium (SPIRIVA HANDIHALER) 18 MCG inhalation capsule   Inhalation   Place 1 capsule (18 mcg total) into inhaler and inhale daily.   90 capsule   1   . traMADol (ULTRAM) 50 MG tablet   Oral   Take 1 tablet (50 mg total) by mouth every 8 (eight) hours as needed for pain.   30 tablet   0   . warfarin (COUMADIN) 5 MG tablet      TAKE 1 TABLET BY MOUTH EVERY DAY   30 tablet  3    BP 144/82  Pulse 80  Temp(Src) 98.4 F (36.9 C) (Oral)  Resp 20  Wt 205 lb (92.987 kg)  SpO2 92% Physical Exam  Constitutional: He is oriented to person, place, and time. He appears well-developed and well-nourished.  HENT:  Head: Normocephalic.  Neck: Normal range of motion. Neck supple.  Cardiovascular: Normal rate and  regular rhythm.   Pulmonary/Chest: Effort normal. He has rales. He exhibits no tenderness.  Short respirations thought chronic, mild rales diffusely. Minimal expiratory wheezing.   Abdominal: Soft. Bowel sounds are normal. There is no tenderness. There is no rebound and no guarding.  Musculoskeletal: Normal range of motion. He exhibits no edema.  Neurological: He is alert and oriented to person, place, and time.  Skin: Skin is warm and dry. No rash noted.  Psychiatric: He has a normal mood and affect.    ED Course  Procedures (including critical care time) Labs Review Labs Reviewed - No data to display Imaging Review Dg Chest 2 View  06/19/2013   CLINICAL DATA:  Cough, congestion for several days.  EXAM: CHEST  2 VIEW  COMPARISON:  CT chest 02/09/2013, chest x-ray 12/30/2012  FINDINGS: Mild stable cardiomegaly. There is right pleural thickening. There is bilateral diffuse interstitial thickening consistent with chronic interstitial disease. There is no new focal parenchymal opacity. There is no pleural effusion or pneumothorax. The osseous structures are unremarkable.  IMPRESSION: No significant interval change compared with 12/30/2012. Findings most consistent with chronic interstitial lung disease. Mild superimposed pneumonitis cannot be excluded.   Electronically Signed   By: Elige Ko   On: 06/19/2013 11:36    EKG Interpretation   None       MDM  No diagnosis found. 1. Bronchitis  Feeling better after duoneb with Atrovent and Xopenex, coughing feels more controlled. Tussionex given also. Discussed close follow up with his pulmonologist Monday. He and family comfortable with discharge. Dr. Romeo Apple is aware of course and care plan.    Arnoldo Hooker, PA-C 06/19/13 1300

## 2013-06-19 NOTE — ED Provider Notes (Signed)
Medical screening examination/treatment/procedure(s) were conducted as a shared visit with non-physician practitioner(s) and myself.  I personally evaluated the patient during the encounter.  EKG Interpretation   None       I interviewed and examined the patient. Lungs are CTAB. Cardiac exam wnl. Abdomen soft.  Suspect URI.   Junius Argyle, MD 06/19/13 [phone]

## 2013-06-19 NOTE — ED Notes (Signed)
Patient here with 3 days of productive cough and chills. Denies fever, history of COPD

## 2013-06-24 ENCOUNTER — Other Ambulatory Visit: Payer: Self-pay | Admitting: *Deleted

## 2013-06-24 DIAGNOSIS — I739 Peripheral vascular disease, unspecified: Secondary | ICD-10-CM

## 2013-06-24 DIAGNOSIS — I4891 Unspecified atrial fibrillation: Secondary | ICD-10-CM

## 2013-06-24 MED ORDER — PRAVASTATIN SODIUM 80 MG PO TABS: 80.0000 mg | ORAL_TABLET | Freq: Every evening | ORAL | Status: DC

## 2013-07-01 ENCOUNTER — Other Ambulatory Visit: Payer: Self-pay

## 2013-07-01 DIAGNOSIS — K219 Gastro-esophageal reflux disease without esophagitis: Secondary | ICD-10-CM

## 2013-07-01 MED ORDER — OMEPRAZOLE 20 MG PO CPDR: 20.0000 mg | DELAYED_RELEASE_CAPSULE | Freq: Every day | ORAL | Status: DC

## 2013-07-04 ENCOUNTER — Other Ambulatory Visit: Payer: Self-pay

## 2013-07-04 ENCOUNTER — Other Ambulatory Visit (INDEPENDENT_AMBULATORY_CARE_PROVIDER_SITE_OTHER): Payer: Medicare PPO

## 2013-07-04 ENCOUNTER — Ambulatory Visit (INDEPENDENT_AMBULATORY_CARE_PROVIDER_SITE_OTHER): Payer: Medicare PPO | Admitting: Pulmonary Disease

## 2013-07-04 ENCOUNTER — Encounter: Payer: Self-pay | Admitting: Pulmonary Disease

## 2013-07-04 ENCOUNTER — Ambulatory Visit (INDEPENDENT_AMBULATORY_CARE_PROVIDER_SITE_OTHER): Payer: Medicare PPO | Admitting: General Practice

## 2013-07-04 VITALS — BP 112/72 | HR 77 | Temp 98.2°F | Ht 67.0 in | Wt 201.0 lb

## 2013-07-04 DIAGNOSIS — I4891 Unspecified atrial fibrillation: Secondary | ICD-10-CM

## 2013-07-04 DIAGNOSIS — J8410 Pulmonary fibrosis, unspecified: Secondary | ICD-10-CM

## 2013-07-04 DIAGNOSIS — I5042 Chronic combined systolic (congestive) and diastolic (congestive) heart failure: Secondary | ICD-10-CM

## 2013-07-04 DIAGNOSIS — J449 Chronic obstructive pulmonary disease, unspecified: Secondary | ICD-10-CM

## 2013-07-04 DIAGNOSIS — D696 Thrombocytopenia, unspecified: Secondary | ICD-10-CM

## 2013-07-04 DIAGNOSIS — N2581 Secondary hyperparathyroidism of renal origin: Secondary | ICD-10-CM

## 2013-07-04 DIAGNOSIS — K219 Gastro-esophageal reflux disease without esophagitis: Secondary | ICD-10-CM

## 2013-07-04 DIAGNOSIS — Z23 Encounter for immunization: Secondary | ICD-10-CM

## 2013-07-04 DIAGNOSIS — I480 Paroxysmal atrial fibrillation: Secondary | ICD-10-CM

## 2013-07-04 DIAGNOSIS — I219 Acute myocardial infarction, unspecified: Secondary | ICD-10-CM

## 2013-07-04 DIAGNOSIS — Z7901 Long term (current) use of anticoagulants: Secondary | ICD-10-CM

## 2013-07-04 DIAGNOSIS — M109 Gout, unspecified: Secondary | ICD-10-CM

## 2013-07-04 LAB — URIC ACID: Uric Acid, Serum: 10.5 mg/dL — ABNORMAL HIGH (ref 4.0–7.8)

## 2013-07-04 LAB — POCT INR: INR: 4.3

## 2013-07-04 LAB — BASIC METABOLIC PANEL
GFR: 37.41 mL/min — ABNORMAL LOW (ref >60.00)
Glucose, Bld: 115 mg/dL — ABNORMAL HIGH (ref 70–99)
Potassium: 3.8 mEq/L (ref 3.5–5.1)
Sodium: 142 mEq/L (ref 135–145)

## 2013-07-04 MED ORDER — OMEPRAZOLE 20 MG PO CPDR: 20.0000 mg | DELAYED_RELEASE_CAPSULE | Freq: Every day | ORAL | Status: DC
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
## 2013-07-04 NOTE — Patient Instructions (Signed)
Pneumonia vaccine Stay on symbicort & spiriva Call us for symptoms of bronchitis 

## 2013-07-05 ENCOUNTER — Telehealth: Payer: Self-pay

## 2013-07-05 LAB — PTH, INTACT AND CALCIUM
Calcium: 8.9 mg/dL (ref 8.4–10.5)
PTH: 263.1 pg/mL — ABNORMAL HIGH (ref 14.0–72.0)

## 2013-07-05 NOTE — Telephone Encounter (Signed)
Call from [facility] reporting a Critical PTH at 263.1 and a calcium of 8.9. Please advise        KP

## 2013-07-05 NOTE — Assessment & Plan Note (Signed)
Pneumonia vaccine Stay on symbicort & spiriva Call us for symptoms of bronchitis

## 2013-07-05 NOTE — Telephone Encounter (Signed)
Report has been called over to Dr.Mattingly's office at [facility] and faxed.      KP

## 2013-07-05 NOTE — Assessment & Plan Note (Signed)
stable °

## 2013-07-06 ENCOUNTER — Other Ambulatory Visit: Payer: Medicare PPO

## 2013-07-18 ENCOUNTER — Ambulatory Visit (INDEPENDENT_AMBULATORY_CARE_PROVIDER_SITE_OTHER): Payer: Medicare PPO | Admitting: Pharmacist

## 2013-07-18 DIAGNOSIS — D696 Thrombocytopenia, unspecified: Secondary | ICD-10-CM

## 2013-07-18 DIAGNOSIS — Z7901 Long term (current) use of anticoagulants: Secondary | ICD-10-CM

## 2013-07-18 DIAGNOSIS — I219 Acute myocardial infarction, unspecified: Secondary | ICD-10-CM

## 2013-07-18 DIAGNOSIS — I4891 Unspecified atrial fibrillation: Secondary | ICD-10-CM

## 2013-07-18 DIAGNOSIS — I480 Paroxysmal atrial fibrillation: Secondary | ICD-10-CM

## 2013-07-18 LAB — POCT INR: INR: 2.2

## 2013-08-08 ENCOUNTER — Ambulatory Visit (INDEPENDENT_AMBULATORY_CARE_PROVIDER_SITE_OTHER): Payer: Medicare FFS | Admitting: Pharmacist

## 2013-08-08 VITALS — Wt 201.0 lb

## 2013-08-08 DIAGNOSIS — D696 Thrombocytopenia, unspecified: Secondary | ICD-10-CM

## 2013-08-08 DIAGNOSIS — I4891 Unspecified atrial fibrillation: Secondary | ICD-10-CM

## 2013-08-08 DIAGNOSIS — I480 Paroxysmal atrial fibrillation: Secondary | ICD-10-CM

## 2013-08-08 DIAGNOSIS — Z7901 Long term (current) use of anticoagulants: Secondary | ICD-10-CM

## 2013-08-08 DIAGNOSIS — I219 Acute myocardial infarction, unspecified: Secondary | ICD-10-CM

## 2013-08-08 LAB — POCT INR: INR: 3.3

## 2013-08-24 ENCOUNTER — Other Ambulatory Visit: Payer: Self-pay | Admitting: Cardiology

## 2013-08-26 ENCOUNTER — Ambulatory Visit (INDEPENDENT_AMBULATORY_CARE_PROVIDER_SITE_OTHER): Payer: Medicare FFS

## 2013-08-26 DIAGNOSIS — Z5181 Encounter for therapeutic drug level monitoring: Secondary | ICD-10-CM

## 2013-08-26 DIAGNOSIS — D696 Thrombocytopenia, unspecified: Secondary | ICD-10-CM

## 2013-08-26 DIAGNOSIS — Z7901 Long term (current) use of anticoagulants: Secondary | ICD-10-CM

## 2013-08-26 DIAGNOSIS — I219 Acute myocardial infarction, unspecified: Secondary | ICD-10-CM

## 2013-08-26 DIAGNOSIS — I480 Paroxysmal atrial fibrillation: Secondary | ICD-10-CM

## 2013-08-26 DIAGNOSIS — I4891 Unspecified atrial fibrillation: Secondary | ICD-10-CM

## 2013-08-26 LAB — POCT INR: INR: 2.4

## 2013-09-01 ENCOUNTER — Encounter: Payer: Self-pay | Admitting: Family Medicine

## 2013-09-02 ENCOUNTER — Ambulatory Visit (HOSPITAL_COMMUNITY)
Admission: RE | Admit: 2013-09-02 | Discharge: 2013-09-02 | Disposition: A | Discharge status: Home / Self Care | Payer: Medicare FFS | Source: Ambulatory Visit | Attending: Internal Medicine | Admitting: Internal Medicine

## 2013-09-02 VITALS — BP 112/62 | HR 78 | Wt 198.5 lb

## 2013-09-02 DIAGNOSIS — I5042 Chronic combined systolic (congestive) and diastolic (congestive) heart failure: Secondary | ICD-10-CM

## 2013-09-02 DIAGNOSIS — I7121 Aneurysm of the ascending aorta, without rupture: Secondary | ICD-10-CM

## 2013-09-02 DIAGNOSIS — I712 Thoracic aortic aneurysm, without rupture, unspecified: Secondary | ICD-10-CM

## 2013-09-02 DIAGNOSIS — J8410 Pulmonary fibrosis, unspecified: Secondary | ICD-10-CM

## 2013-09-02 DIAGNOSIS — I5022 Chronic systolic (congestive) heart failure: Secondary | ICD-10-CM | POA: Insufficient documentation

## 2013-09-02 DIAGNOSIS — I714 Abdominal aortic aneurysm, without rupture, unspecified: Secondary | ICD-10-CM

## 2013-09-02 DIAGNOSIS — I4819 Other persistent atrial fibrillation: Secondary | ICD-10-CM

## 2013-09-02 DIAGNOSIS — I2510 Atherosclerotic heart disease of native coronary artery without angina pectoris: Secondary | ICD-10-CM | POA: Insufficient documentation

## 2013-09-02 DIAGNOSIS — N189 Chronic kidney disease, unspecified: Secondary | ICD-10-CM | POA: Insufficient documentation

## 2013-09-02 DIAGNOSIS — I4891 Unspecified atrial fibrillation: Secondary | ICD-10-CM

## 2013-09-02 DIAGNOSIS — E785 Hyperlipidemia, unspecified: Secondary | ICD-10-CM

## 2013-09-02 DIAGNOSIS — I509 Heart failure, unspecified: Secondary | ICD-10-CM

## 2013-09-02 DIAGNOSIS — I739 Peripheral vascular disease, unspecified: Secondary | ICD-10-CM

## 2013-09-02 LAB — BASIC METABOLIC PANEL
BUN: 36 mg/dL — ABNORMAL HIGH (ref 6–23)
CHLORIDE: 104 meq/L (ref 96–112)
CO2: 28 mEq/L (ref 19–32)
Calcium: 8.9 mg/dL (ref 8.4–10.5)
Creatinine, Ser: 2 mg/dL — ABNORMAL HIGH (ref 0.50–1.35)
GFR calc non Af Amer: 30 mL/min — ABNORMAL LOW (ref >90)
GFR, EST AFRICAN AMERICAN: 34 mL/min — AB (ref >90)
Glucose, Bld: 133 mg/dL — ABNORMAL HIGH (ref 70–99)
Potassium: 4 mEq/L (ref 3.7–5.3)
SODIUM: 147 meq/L (ref 137–147)
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
## 2013-09-02 NOTE — Patient Instructions (Signed)
Follow up 3 months, we will call!

## 2013-09-13 ENCOUNTER — Other Ambulatory Visit: Payer: Self-pay | Admitting: Cardiology

## 2013-10-03 ENCOUNTER — Ambulatory Visit (INDEPENDENT_AMBULATORY_CARE_PROVIDER_SITE_OTHER): Payer: Medicare FFS | Admitting: Pharmacist

## 2013-10-03 DIAGNOSIS — I219 Acute myocardial infarction, unspecified: Secondary | ICD-10-CM

## 2013-10-03 DIAGNOSIS — I480 Paroxysmal atrial fibrillation: Secondary | ICD-10-CM

## 2013-10-03 DIAGNOSIS — Z7901 Long term (current) use of anticoagulants: Secondary | ICD-10-CM

## 2013-10-03 DIAGNOSIS — Z5181 Encounter for therapeutic drug level monitoring: Secondary | ICD-10-CM

## 2013-10-03 DIAGNOSIS — I4891 Unspecified atrial fibrillation: Secondary | ICD-10-CM

## 2013-10-03 DIAGNOSIS — D696 Thrombocytopenia, unspecified: Secondary | ICD-10-CM

## 2013-10-03 LAB — POCT INR: INR: 3.6

## 2013-10-17 ENCOUNTER — Ambulatory Visit (INDEPENDENT_AMBULATORY_CARE_PROVIDER_SITE_OTHER): Payer: Medicare FFS | Admitting: *Deleted

## 2013-10-17 DIAGNOSIS — Z5181 Encounter for therapeutic drug level monitoring: Secondary | ICD-10-CM

## 2013-10-17 DIAGNOSIS — I219 Acute myocardial infarction, unspecified: Secondary | ICD-10-CM

## 2013-10-17 DIAGNOSIS — I480 Paroxysmal atrial fibrillation: Secondary | ICD-10-CM

## 2013-10-17 DIAGNOSIS — Z7901 Long term (current) use of anticoagulants: Secondary | ICD-10-CM

## 2013-10-17 DIAGNOSIS — D696 Thrombocytopenia, unspecified: Secondary | ICD-10-CM

## 2013-10-17 DIAGNOSIS — I4891 Unspecified atrial fibrillation: Secondary | ICD-10-CM

## 2013-10-17 LAB — POCT INR: INR: 2

## 2013-10-25 ENCOUNTER — Telehealth: Payer: Self-pay | Admitting: Pulmonary Disease

## 2013-10-25 NOTE — Telephone Encounter (Signed)
lmomtcb x1 for [employer]

## 2013-10-26 MED ORDER — TIOTROPIUM BROMIDE MONOHYDRATE 18 MCG IN CAPS: 18.0000 ug | ORAL_CAPSULE | Freq: Every day | RESPIRATORY_TRACT | Status: DC

## 2013-10-26 NOTE — Telephone Encounter (Signed)
Pt returned call

## 2013-10-26 NOTE — Telephone Encounter (Signed)
LMTCBx2. Pt needs OV set for April as well.[place] Bing, CMA

## 2013-10-26 NOTE — Telephone Encounter (Signed)
Pt's wife returned call  

## 2013-10-26 NOTE — Telephone Encounter (Signed)
Will take samples of spiriva to [facility] tomorrow afternoon when I go to [facility] Spouse is aware and needed nothing further

## 2013-10-28 ENCOUNTER — Telehealth: Payer: Self-pay | Admitting: Pulmonary Disease

## 2013-10-28 MED ORDER — TIOTROPIUM BROMIDE MONOHYDRATE 18 MCG IN CAPS: 18.0000 ug | ORAL_CAPSULE | Freq: Every day | RESPIRATORY_TRACT | Status: DC

## 2013-10-28 NOTE — Telephone Encounter (Signed)
Pt's friend walked in stating pt didn't make it to [facility] office yesterday to get Spiriva samples.  Requesting samples from here.  I prepared samples and had documented them.  Before I could take samples to front, Mindy had actually brought the samples back from [facility] and had given them to pt.  Nothing further needed.

## 2013-11-11 ENCOUNTER — Encounter (HOSPITAL_COMMUNITY): Payer: Self-pay

## 2013-11-14 ENCOUNTER — Encounter (HOSPITAL_COMMUNITY): Payer: Self-pay

## 2013-11-14 ENCOUNTER — Ambulatory Visit (HOSPITAL_COMMUNITY)
Admission: RE | Admit: 2013-11-14 | Discharge: 2013-11-14 | Disposition: A | Discharge status: Home / Self Care | Payer: Medicare FFS | Source: Ambulatory Visit | Attending: Internal Medicine | Admitting: Internal Medicine

## 2013-11-14 ENCOUNTER — Ambulatory Visit (INDEPENDENT_AMBULATORY_CARE_PROVIDER_SITE_OTHER): Payer: Medicare FFS | Admitting: *Deleted

## 2013-11-14 VITALS — BP 124/81 | HR 74 | Resp 18 | Wt 201.2 lb

## 2013-11-14 DIAGNOSIS — I2510 Atherosclerotic heart disease of native coronary artery without angina pectoris: Secondary | ICD-10-CM | POA: Insufficient documentation

## 2013-11-14 DIAGNOSIS — I4891 Unspecified atrial fibrillation: Secondary | ICD-10-CM

## 2013-11-14 DIAGNOSIS — D696 Thrombocytopenia, unspecified: Secondary | ICD-10-CM

## 2013-11-14 DIAGNOSIS — Z7901 Long term (current) use of anticoagulants: Secondary | ICD-10-CM

## 2013-11-14 DIAGNOSIS — I509 Heart failure, unspecified: Secondary | ICD-10-CM

## 2013-11-14 DIAGNOSIS — I480 Paroxysmal atrial fibrillation: Secondary | ICD-10-CM

## 2013-11-14 DIAGNOSIS — I5042 Chronic combined systolic (congestive) and diastolic (congestive) heart failure: Secondary | ICD-10-CM | POA: Insufficient documentation

## 2013-11-14 DIAGNOSIS — I219 Acute myocardial infarction, unspecified: Secondary | ICD-10-CM

## 2013-11-14 DIAGNOSIS — Z5181 Encounter for therapeutic drug level monitoring: Secondary | ICD-10-CM

## 2013-11-14 LAB — POCT INR: INR: 1.6
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
## 2013-11-14 NOTE — Patient Instructions (Signed)
Follow up in 4 months with Dr Aundra Dubin  Do the following things EVERYDAY: 1) Weigh yourself in the morning before breakfast. Write it down and keep it in a log. 2) Take your medicines as prescribed 3) Eat low salt foods-Limit salt (sodium) to 2000 mg per day.  4) Stay as active as you can everyday 5) Limit all fluids for the day to less than 2 liters

## 2013-11-19 ENCOUNTER — Other Ambulatory Visit: Payer: Self-pay | Admitting: Cardiology

## 2013-11-21 ENCOUNTER — Ambulatory Visit (INDEPENDENT_AMBULATORY_CARE_PROVIDER_SITE_OTHER): Payer: Medicare FFS | Admitting: *Deleted

## 2013-11-21 DIAGNOSIS — I4891 Unspecified atrial fibrillation: Secondary | ICD-10-CM

## 2013-11-21 DIAGNOSIS — Z5181 Encounter for therapeutic drug level monitoring: Secondary | ICD-10-CM

## 2013-11-21 DIAGNOSIS — Z7901 Long term (current) use of anticoagulants: Secondary | ICD-10-CM

## 2013-11-21 DIAGNOSIS — D696 Thrombocytopenia, unspecified: Secondary | ICD-10-CM

## 2013-11-21 DIAGNOSIS — I219 Acute myocardial infarction, unspecified: Secondary | ICD-10-CM

## 2013-11-21 DIAGNOSIS — I480 Paroxysmal atrial fibrillation: Secondary | ICD-10-CM

## 2013-11-21 LAB — POCT INR: INR: 2

## 2013-11-23 ENCOUNTER — Encounter (HOSPITAL_COMMUNITY): Payer: Medicare FFS

## 2013-12-05 ENCOUNTER — Encounter: Payer: Self-pay | Admitting: Family Medicine

## 2013-12-05 ENCOUNTER — Ambulatory Visit (INDEPENDENT_AMBULATORY_CARE_PROVIDER_SITE_OTHER): Payer: Medicare FFS | Admitting: Family Medicine

## 2013-12-05 VITALS — BP 140/66 | HR 77 | Temp 98.1°F | Wt 197.0 lb

## 2013-12-05 DIAGNOSIS — M109 Gout, unspecified: Secondary | ICD-10-CM

## 2013-12-05 DIAGNOSIS — M542 Cervicalgia: Secondary | ICD-10-CM

## 2013-12-05 DIAGNOSIS — I509 Heart failure, unspecified: Secondary | ICD-10-CM

## 2013-12-05 DIAGNOSIS — E669 Obesity, unspecified: Secondary | ICD-10-CM | POA: Insufficient documentation

## 2013-12-05 DIAGNOSIS — E785 Hyperlipidemia, unspecified: Secondary | ICD-10-CM

## 2013-12-05 MED ORDER — TIZANIDINE HCL 2 MG PO TABS: 2.0000 mg | ORAL_TABLET | Freq: Four times a day (QID) | ORAL | Status: DC | PRN

## 2013-12-05 MED ORDER — TRAMADOL HCL 50 MG PO TABS: ORAL_TABLET | ORAL | Status: DC

## 2013-12-05 MED ORDER — TIZANIDINE HCL 2 MG PO TABS
2.0000 mg | ORAL_TABLET | Freq: Four times a day (QID) | ORAL | Status: DC | PRN
Start: 2013-12-05 — End: 2013-12-08
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
## 2013-12-05 NOTE — Progress Notes (Signed)
Pre visit review using our clinic review tool, if applicable. No additional management support is needed unless otherwise documented below in the visit note. 

## 2013-12-06 LAB — CBC WITH DIFFERENTIAL/PLATELET
BASOS ABS: 0 10*3/uL (ref 0.0–0.1)
BASOS PCT: 0.4 % (ref 0.0–3.0)
EOS ABS: 0.5 10*3/uL (ref 0.0–0.7)
Eosinophils Relative: 5.6 % — ABNORMAL HIGH (ref 0.0–5.0)
HCT: 40.1 % (ref 39.0–52.0)
Hemoglobin: 12.8 g/dL — ABNORMAL LOW (ref 13.0–17.0)
LYMPHS PCT: 19.9 % (ref 12.0–46.0)
Lymphs Abs: 2 10*3/uL (ref 0.7–4.0)
MCHC: 31.8 g/dL (ref 30.0–36.0)
MCV: 84.1 fl (ref 78.0–100.0)
MONOS PCT: 10.2 % (ref 3.0–12.0)
Monocytes Absolute: 1 10*3/uL (ref 0.1–1.0)
NEUTROS PCT: 63.9 % (ref 43.0–77.0)
Neutro Abs: 6.3 10*3/uL (ref 1.4–7.7)
Platelets: 147 10*3/uL — ABNORMAL LOW (ref 150.0–400.0)
RBC: 4.77 Mil/uL (ref 4.22–5.81)
RDW: 14.9 % (ref 11.5–15.5)
WBC: 9.8 10*3/uL (ref 4.0–10.5)

## 2013-12-06 LAB — BASIC METABOLIC PANEL
BUN: 38 mg/dL — ABNORMAL HIGH (ref 6–23)
CO2: 31 meq/L (ref 19–32)
CREATININE: 1.9 mg/dL — AB (ref 0.4–1.5)
Calcium: 9.3 mg/dL (ref 8.4–10.5)
Chloride: 97 mEq/L (ref 96–112)
GFR: 36.68 mL/min — ABNORMAL LOW (ref >60.00)
Glucose, Bld: 84 mg/dL (ref 70–99)
Potassium: 4 mEq/L (ref 3.5–5.1)
Sodium: 139 mEq/L (ref 135–145)

## 2013-12-06 LAB — HEPATIC FUNCTION PANEL
ALT: 17 U/L (ref 0–53)
AST: 20 U/L (ref 0–37)
Albumin: 4.1 g/dL (ref 3.5–5.2)
Alkaline Phosphatase: 112 U/L (ref 39–117)
BILIRUBIN DIRECT: 0.2 mg/dL (ref 0.0–0.3)
TOTAL PROTEIN: 7.9 g/dL (ref 6.0–8.3)
Total Bilirubin: 1 mg/dL (ref 0.2–1.2)

## 2013-12-06 LAB — LIPID PANEL
CHOL/HDL RATIO: 4
Cholesterol: 133 mg/dL (ref 0–200)
HDL: 33.8 mg/dL — ABNORMAL LOW (ref >39.00)
LDL Cholesterol: 71 mg/dL (ref 0–99)
Triglycerides: 141 mg/dL (ref 0.0–149.0)
VLDL: 28.2 mg/dL (ref 0.0–40.0)

## 2013-12-08 ENCOUNTER — Emergency Department (HOSPITAL_COMMUNITY): Payer: Medicare FFS

## 2013-12-08 ENCOUNTER — Emergency Department (HOSPITAL_COMMUNITY)
Admission: EM | Admit: 2013-12-08 | Discharge: 2013-12-08 | Disposition: A | Discharge status: Home / Self Care | Payer: Medicare FFS | Attending: Emergency Medicine | Admitting: Emergency Medicine

## 2013-12-08 ENCOUNTER — Emergency Department (HOSPITAL_BASED_OUTPATIENT_CLINIC_OR_DEPARTMENT_OTHER)
Admission: EM | Admit: 2013-12-08 | Discharge: 2013-12-08 | Discharge status: Against Medical Advice | Payer: Medicare FFS | Source: Home / Self Care

## 2013-12-08 ENCOUNTER — Telehealth: Payer: Self-pay

## 2013-12-08 ENCOUNTER — Encounter (HOSPITAL_COMMUNITY): Payer: Self-pay | Admitting: Emergency Medicine

## 2013-12-08 ENCOUNTER — Encounter (HOSPITAL_BASED_OUTPATIENT_CLINIC_OR_DEPARTMENT_OTHER): Payer: Self-pay | Admitting: Emergency Medicine

## 2013-12-08 DIAGNOSIS — I4891 Unspecified atrial fibrillation: Secondary | ICD-10-CM | POA: Insufficient documentation

## 2013-12-08 DIAGNOSIS — Z79899 Other long term (current) drug therapy: Secondary | ICD-10-CM | POA: Insufficient documentation

## 2013-12-08 DIAGNOSIS — I5042 Chronic combined systolic (congestive) and diastolic (congestive) heart failure: Secondary | ICD-10-CM

## 2013-12-08 DIAGNOSIS — E785 Hyperlipidemia, unspecified: Secondary | ICD-10-CM | POA: Insufficient documentation

## 2013-12-08 DIAGNOSIS — I10 Essential (primary) hypertension: Secondary | ICD-10-CM | POA: Insufficient documentation

## 2013-12-08 DIAGNOSIS — J449 Chronic obstructive pulmonary disease, unspecified: Secondary | ICD-10-CM

## 2013-12-08 DIAGNOSIS — G8911 Acute pain due to trauma: Secondary | ICD-10-CM

## 2013-12-08 DIAGNOSIS — Z9889 Other specified postprocedural states: Secondary | ICD-10-CM | POA: Insufficient documentation

## 2013-12-08 DIAGNOSIS — Z7901 Long term (current) use of anticoagulants: Secondary | ICD-10-CM | POA: Insufficient documentation

## 2013-12-08 DIAGNOSIS — I2510 Atherosclerotic heart disease of native coronary artery without angina pectoris: Secondary | ICD-10-CM

## 2013-12-08 DIAGNOSIS — M542 Cervicalgia: Secondary | ICD-10-CM | POA: Insufficient documentation

## 2013-12-08 DIAGNOSIS — K219 Gastro-esophageal reflux disease without esophagitis: Secondary | ICD-10-CM | POA: Insufficient documentation

## 2013-12-08 DIAGNOSIS — Z87891 Personal history of nicotine dependence: Secondary | ICD-10-CM | POA: Insufficient documentation

## 2013-12-08 DIAGNOSIS — M436 Torticollis: Secondary | ICD-10-CM | POA: Insufficient documentation

## 2013-12-08 DIAGNOSIS — R4182 Altered mental status, unspecified: Secondary | ICD-10-CM | POA: Insufficient documentation

## 2013-12-08 DIAGNOSIS — J4489 Other specified chronic obstructive pulmonary disease: Secondary | ICD-10-CM | POA: Insufficient documentation

## 2013-12-08 LAB — COMPREHENSIVE METABOLIC PANEL
ALK PHOS: 144 U/L — AB (ref 39–117)
ALT: 15 U/L (ref 0–53)
AST: 19 U/L (ref 0–37)
Albumin: 3.7 g/dL (ref 3.5–5.2)
BILIRUBIN TOTAL: 0.7 mg/dL (ref 0.3–1.2)
BUN: 44 mg/dL — ABNORMAL HIGH (ref 6–23)
CO2: 30 mEq/L (ref 19–32)
Calcium: 9.3 mg/dL (ref 8.4–10.5)
Chloride: 96 mEq/L (ref 96–112)
Creatinine, Ser: 1.66 mg/dL — ABNORMAL HIGH (ref 0.50–1.35)
GFR calc non Af Amer: 37 mL/min — ABNORMAL LOW (ref >90)
GFR, EST AFRICAN AMERICAN: 43 mL/min — AB (ref >90)
GLUCOSE: 91 mg/dL (ref 70–99)
POTASSIUM: 4 meq/L (ref 3.7–5.3)
Sodium: 141 mEq/L (ref 137–147)
TOTAL PROTEIN: 8 g/dL (ref 6.0–8.3)

## 2013-12-08 LAB — PROTIME-INR
INR: 2.59 — ABNORMAL HIGH (ref 0.00–1.49)
Prothrombin Time: 26.9 seconds — ABNORMAL HIGH (ref 11.6–15.2)

## 2013-12-08 LAB — URINALYSIS, ROUTINE W REFLEX MICROSCOPIC
Bilirubin Urine: NEGATIVE
Glucose, UA: NEGATIVE mg/dL
Hgb urine dipstick: NEGATIVE
Ketones, ur: NEGATIVE mg/dL
LEUKOCYTES UA: NEGATIVE
NITRITE: NEGATIVE
PROTEIN: NEGATIVE mg/dL
Specific Gravity, Urine: 1.008 (ref 1.005–1.030)
Urobilinogen, UA: 1 mg/dL (ref 0.0–1.0)
pH: 6.5 (ref 5.0–8.0)

## 2013-12-08 LAB — CBC
HEMATOCRIT: 39.8 % (ref 39.0–52.0)
HEMOGLOBIN: 12.5 g/dL — AB (ref 13.0–17.0)
MCH: 26.4 pg (ref 26.0–34.0)
MCHC: 31.4 g/dL (ref 30.0–36.0)
MCV: 84 fL (ref 78.0–100.0)
Platelets: 177 10*3/uL (ref 150–400)
RBC: 4.74 MIL/uL (ref 4.22–5.81)
RDW: 14.2 % (ref 11.5–15.5)
WBC: 6.6 10*3/uL (ref 4.0–10.5)

## 2013-12-08 LAB — CBG MONITORING, ED: GLUCOSE-CAPILLARY: 87 mg/dL (ref 70–99)

## 2013-12-08 MED ORDER — METHYLPREDNISOLONE SODIUM SUCC 125 MG IJ SOLR
125.0000 mg | Freq: Once | INTRAMUSCULAR | Status: AC
  Administered 2013-12-08: 125 mg via INTRAVENOUS
  Filled 2013-12-08: qty 2

## 2013-12-08 MED ORDER — HYDROCODONE-ACETAMINOPHEN 5-325 MG PO TABS
1.0000 | ORAL_TABLET | Freq: Once | ORAL | Status: AC
  Administered 2013-12-08: 1 via ORAL
  Filled 2013-12-08: qty 1

## 2013-12-08 MED ORDER — HYDROCODONE-ACETAMINOPHEN 5-325 MG PO TABS: 1.0000 | ORAL_TABLET | ORAL | Status: DC | PRN

## 2013-12-08 MED ORDER — PREDNISONE 10 MG PO TABS: 20.0000 mg | ORAL_TABLET | Freq: Every day | ORAL | Status: DC

## 2013-12-08 NOTE — ED Provider Notes (Signed)
CSN: [id number]     Arrival date & time 12/08/13  1800 History   First MD Initiated Contact with Patient 12/08/13 2010     Chief Complaint  Patient presents with  . Neck Pain    left  . Altered Mental Status     (Consider location/radiation/quality/duration/timing/severity/associated sxs/prior Treatment) Patient is a 80 y.o. male presenting with neck pain and altered mental status. The history is provided by the patient and the spouse.  Neck Pain Altered Mental Status  patient here complaining of worsening left-sided neck pain that began 7 days ago he was doing gardening. Denies any radicular pain down his left arm. No weakness or numbness or tingling. Seen by his physician and placed on also relaxants which has not helped. Has had chronic confusion for 2-3 months which is unchanged at present. No recent fevers. Pain characterized as sharp and worse with movement and positions. Dr. was going to order a CT of his neck due to a prior history of cervical spine injury. The  Past Medical History  Diagnosis Date  . Hypertension   . Hyperlipidemia   . Chronic combined systolic and diastolic CHF (congestive heart failure)     a. 10/2012 Echo: EF 40-45%, Gr1 dd, postlat AK, mild to mod AS, Triv AI, Mild MR, mildly dil LA.  . Sleep apnea   . Aortic aneurysm, abdominal     f/u by [facility]  . Pulmonary fibrosis   . Ischemic cardiomyopathy     a. EF 40-45% by echo 10/2012  . CAD (coronary artery disease)     a. s/p prior MI;  b. 11/1999 Cath: LM nl, LAD 30m, LCX min irregs, OM 100, RCA 80p, 100d, L->L and L->R collats, EF 54%.  Marland Kitchen COPD (chronic obstructive pulmonary disease)   . PAF (paroxysmal atrial fibrillation)     a. Dx 10/2012 during hosp for resp failure/copd flare  . Aortic stenosis     mild to mod by echo 10/2012 (mean gradient 14 mmHg)  . Shortness of breath   . GERD (gastroesophageal reflux disease)    Past Surgical History  Procedure Laterality Date  . Neck surgery    . Arm surgery    .  Back surgery    . Leg surgery    . Cardioversion N/A 01/20/2013    Procedure: CARDIOVERSION;  Surgeon: Darlin Coco, MD;  Location: [facility] ENDOSCOPY;  Service: Cardiovascular;  Laterality: N/A;   Family History  Problem Relation Age of Onset  . Heart disease Mother     Heart Disease before age  23  . Other Mother     Rheumatism  . Heart attack Mother   . Heart attack Father    History  Substance Use Topics  . Smoking status: Former Smoker -- 0.80 packs/day for 78 years    Types: Cigarettes    Quit date: 08/05/2011  . Smokeless tobacco: Current User    Types: Chew    Last Attempt to Quit: 08/11/2011  . Alcohol Use: No    Review of Systems  Musculoskeletal: Positive for neck pain.  All other systems reviewed and are negative.     Allergies  Ceftriaxone sodium in dextrose  Home Medications   Prior to Admission medications   Medication Sig Start Date End Date Taking? Authorizing Provider  bisoprolol (ZEBETA) 10 MG tablet TAKE 1 TABLET EVERY DAY    Larey Dresser, MD  budesonide-formoterol [facility]) 160-4.5 MCG/ACT inhaler Inhale 2 puffs into the lungs 2 (two) times daily. 04/14/12  Rosalita Chessman, DO  calcitRIOL (ROCALTROL) 0.25 MCG capsule Take 1 capsule by mouth daily. 11/19/13   Historical Provider, MD  cetirizine (ZYRTEC) 10 MG tablet Take 10 mg by mouth daily.      Historical Provider, MD  chlorpheniramine-HYDROcodone (TUSSIONEX) 10-8 MG/5ML LQCR Take 5 mLs by mouth every 12 (twelve) hours as needed for cough. 06/19/13   Shari A Upstill, PA-C  Coenzyme Q10 (CO Q 10 PO) Take 1 tablet by mouth daily.     Historical Provider, MD  colchicine 0.6 MG tablet 2 by mouth now, then 1 an hour later. Then 1 po daily 05/13/13   Rosalita Chessman, DO  Cyanocobalamin (VITAMIN B-12 PO) Take 1 tablet by mouth daily.     Historical Provider, MD  docusate sodium (COLACE) 100 MG capsule Take 100 mg by mouth daily.    Historical Provider, MD  furosemide (LASIX) 40 MG tablet 1 and 1/2  tablets (total 60mg  ) two times a day 05/24/13   Larey Dresser, MD  Garlic (ODOR FREE GARLIC) 630 MG TABS Take 1 tablet by mouth daily.      Historical Provider, MD  guaiFENesin (MUCINEX) 600 MG 12 hr tablet Take 1,200 mg by mouth as needed.     Historical Provider, MD  levalbuterol Penne Lash) 0.63 MG/3ML nebulizer solution Take 3 mLs (0.63 mg total) by nebulization every 6 (six) hours as needed for wheezing or shortness of breath. DX 515 02/18/13   Rigoberto Noel, MD  losartan (COZAAR) 25 MG tablet Take 0.5 tablets (12.5 mg total) by mouth daily. 05/17/13   Larey Dresser, MD  Multiple Vitamin (MULITIVITAMIN WITH MINERALS) TABS Take 1 tablet by mouth daily.    Historical Provider, MD  Omega-3 Fatty Acids (FISH OIL PO) Take 1 capsule by mouth daily.     Historical Provider, MD  omeprazole (PRILOSEC) 20 MG capsule Take 1 capsule (20 mg total) by mouth daily. 07/04/13   Rosalita Chessman, DO  pravastatin (PRAVACHOL) 80 MG tablet TAKE 1 TABLET EVERY EVENING    Larey Dresser, MD  tiotropium (SPIRIVA HANDIHALER) 18 MCG inhalation capsule Place 1 capsule (18 mcg total) into inhaler and inhale daily. 10/28/13   Rigoberto Noel, MD  tiZANidine (ZANAFLEX) 2 MG tablet Take 1 tablet (2 mg total) by mouth every 6 (six) hours as needed for muscle spasms. 12/05/13   Rosalita Chessman, DO  traMADol (ULTRAM) 50 MG tablet 1-2 po q6h prn 12/05/13   Rosalita Chessman, DO  warfarin (COUMADIN) 5 MG tablet TAKE 1 TABLET BY MOUTH EVERY DAY 08/24/13   Larey Dresser, MD   BP 102/65  Pulse 75  Temp(Src) 98.2 F (36.8 C) (Oral)  Resp 20  SpO2 95% Physical Exam  Nursing note and vitals reviewed. Constitutional: He is oriented to person, place, and time. He appears well-developed and well-nourished.  Non-toxic appearance. No distress.  HENT:  Head: Normocephalic and atraumatic.  Eyes: Conjunctivae, EOM and lids are normal. Pupils are equal, round, and reactive to light.  Neck: Normal range of motion. Neck supple. Muscular tenderness  present. No spinous process tenderness present. No tracheal deviation present. No mass present.    Cardiovascular: Normal rate, regular rhythm and normal heart sounds.  Exam reveals no gallop.   No murmur heard. Pulmonary/Chest: Effort normal and breath sounds normal. No stridor. No respiratory distress. He has no decreased breath sounds. He has no wheezes. He has no rhonchi. He has no rales.  Abdominal: Soft. Normal appearance  and bowel sounds are normal. He exhibits no distension. There is no tenderness. There is no rebound and no CVA tenderness.  Musculoskeletal: Normal range of motion. He exhibits no edema and no tenderness.  Neurological: He is alert and oriented to person, place, and time. He has normal strength. He displays no atrophy. No cranial nerve deficit or sensory deficit. GCS eye subscore is 4. GCS verbal subscore is 5. GCS motor subscore is 6.  Skin: Skin is warm and dry. No abrasion and no rash noted.  Psychiatric: He has a normal mood and affect. His speech is normal and behavior is normal.    ED Course  Procedures (including critical care time) Labs Review Labs Reviewed  URINALYSIS, ROUTINE W REFLEX MICROSCOPIC  CBC  COMPREHENSIVE METABOLIC PANEL  PROTIME-INR  CBG MONITORING, ED    Imaging Review Ct Head Wo Contrast  12/08/2013   CLINICAL DATA:  Altered mental status  EXAM: CT HEAD WITHOUT CONTRAST  TECHNIQUE: Contiguous axial images were obtained from the base of the skull through the vertex without intravenous contrast.  COMPARISON:  None.  FINDINGS: Chronic microvascular ischemic changes are seen bilaterally. Encephalomalacia in the right cerebellar hemisphere is most consistent with remote infarction. There is mild age-related cerebral volume loss. Negative for intra or extra-axial hemorrhage, hydrocephalus, mass effect, mass lesion, or CT evidence of acute cortically based infarction.  The skull is intact. The visualized paranasal sinuses are clear. There is a small  right mastoid effusion of uncertain chronicity. The left mastoid air cells are clear. Cerclage wires are seen at the level of C1. Scalp soft tissues are symmetric.  IMPRESSION: 1. No acute intracranial abnormality identified. 2. 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 3. Small right mastoid effusion.   Electronically Signed   By: Curlene Dolphin M.D.   On: 12/08/2013 20:08     EKG Interpretation   Date/Time:  Thursday Dec 08 2013 19:20:38 EDT Ventricular Rate:  60 PR Interval:    QRS Duration: 167 QT Interval:  480 QTC Calculation: 480 R Axis:   -4 Text Interpretation:  Atrial fibrillation Left bundle branch block  Baseline wander in lead(s) V6 No significant change since last tracing  Confirmed by Hamed Debella  MD, Emersynn Deatley ([id number]) on 12/08/2013 8:27:02 PM      MDM   Final diagnoses:  None    Patient given Solu-Medrol and Vicodin for pain here. suspect that patient has musculoskeletal strain. Neurological exam is stable. [facility] prescription for pain meds as well as prednisone    Leota Jacobsen, MD 12/08/13 [id number]

## 2013-12-08 NOTE — ED Notes (Signed)
Neck pain for a week. He was seen by his MD earlier in the week for same and medications were started without improvement. States now he is unable to turn his head without pain.

## 2013-12-08 NOTE — Telephone Encounter (Signed)
Patient's daughter called and states that the patient is getting worse instead of better. He is unable to perform ADL's or make any food for himself. She is taking him to  [facility] for evaluation. Notified PCP

## 2013-12-08 NOTE — ED Notes (Addendum)
Pt c/o left side neck pain x 7 days. Was put tramadol and Zanaflex w/o relief. Pt unable to turn neck normally. Pt daughter states he has pins in neck from surgery. Pt' daughter states he has been a bit confused at times but is at norm at the moment.

## 2013-12-08 NOTE — Discharge Instructions (Signed)
Torticollis, Acute °You have suddenly (acutely) developed a twisted neck (torticollis). This is usually a self-limited condition. °CAUSES  °Acute torticollis may be caused by malposition, trauma or infection. Most commonly, acute torticollis is caused by sleeping in an awkward position. Torticollis may also be caused by the flexion, extension or twisting of the neck muscles beyond their normal position. Sometimes, the exact cause may not be known. °SYMPTOMS  °Usually, there is pain and limited movement of the neck. Your neck may twist to one side. °DIAGNOSIS  °The diagnosis is often made by physical examination. X-rays, CT scans or MRIs may be done if there is a history of trauma or concern of infection. °TREATMENT  °For a common, stiff neck that develops during sleep, treatment is focused on relaxing the contracted neck muscle. Medications (including shots) may be used to treat the problem. Most cases resolve in several days. Torticollis usually responds to conservative physical therapy. If left untreated, the shortened and spastic neck muscle can cause deformities in the face and neck. Rarely, surgery is required. °HOME CARE INSTRUCTIONS  °· Use over-the-counter and prescription medications as directed by your caregiver. °· Do stretching exercises and massage the neck as directed by your caregiver. °· Follow up with physical therapy if needed and as directed by your caregiver. °SEEK IMMEDIATE MEDICAL CARE IF:  °· You develop difficulty breathing or noisy breathing (stridor). °· You drool, develop trouble swallowing or have pain with swallowing. °· You develop numbness or weakness in the hands or feet. °· You have changes in speech or vision. °· You have problems with urination or bowel movements. °· You have difficulty walking. °· You have a fever. °· You have increased pain. °MAKE SURE YOU:  °· Understand these instructions. °· Will watch your condition. °· Will get help right away if you are not doing well or  get worse. °Document Released: 07/18/2000 Document Revised: 10/13/2011 Document Reviewed: 08/29/2009 °Exit[employer]® Patient Information ©2014 [employer]. ° °

## 2013-12-08 NOTE — Telephone Encounter (Signed)
Please call in am and see how he is

## 2013-12-08 NOTE — ED Notes (Signed)
Staff reported pt and family member left ED prior to being seen by EDP.

## 2013-12-09 NOTE — Telephone Encounter (Signed)
LM for daughter to CB

## 2013-12-12 NOTE — Telephone Encounter (Signed)
Left message for call back Non identifiable  

## 2013-12-13 NOTE — Telephone Encounter (Signed)
That is a lot but it is for CHF-- he is seen by cardiology

## 2013-12-13 NOTE — Telephone Encounter (Signed)
Left message indicating recommendations

## 2013-12-13 NOTE — Telephone Encounter (Signed)
Patient's daughter called to review labs. States that he is feeling better post ED visit. Advised of need for nephrology appt to [place] if he should be taking 120mg  Lasix per day?  Please advise on Lasix

## 2014-01-09 ENCOUNTER — Ambulatory Visit (INDEPENDENT_AMBULATORY_CARE_PROVIDER_SITE_OTHER): Payer: Medicare FFS | Admitting: Surgery

## 2014-01-09 DIAGNOSIS — I4891 Unspecified atrial fibrillation: Secondary | ICD-10-CM

## 2014-01-09 DIAGNOSIS — D696 Thrombocytopenia, unspecified: Secondary | ICD-10-CM

## 2014-01-09 DIAGNOSIS — I219 Acute myocardial infarction, unspecified: Secondary | ICD-10-CM

## 2014-01-09 DIAGNOSIS — I480 Paroxysmal atrial fibrillation: Secondary | ICD-10-CM

## 2014-01-09 DIAGNOSIS — Z7901 Long term (current) use of anticoagulants: Secondary | ICD-10-CM

## 2014-01-09 DIAGNOSIS — Z5181 Encounter for therapeutic drug level monitoring: Secondary | ICD-10-CM

## 2014-01-09 LAB — POCT INR: INR: 3.6

## 2014-01-23 ENCOUNTER — Other Ambulatory Visit: Payer: Self-pay | Admitting: Cardiology

## 2014-02-07 ENCOUNTER — Telehealth: Payer: Self-pay | Admitting: Pulmonary Disease

## 2014-02-07 MED ORDER — AZITHROMYCIN 250 MG PO TABS: ORAL_TABLET | ORAL | Status: DC

## 2014-02-07 MED ORDER — PREDNISONE 10 MG PO TABS: ORAL_TABLET | ORAL | Status: DC

## 2014-02-07 NOTE — Telephone Encounter (Signed)
Called and spoke with pts daughter Vicente Males and she is aware of RA recs and that the pt will start on the zpak and pred taper.  These have been sent to the pts pharmacy and Vicente Males is aware.  Vicente Males stated that they will call back when the pt is able to come in for appt and will schedule this with TP.

## 2014-02-07 NOTE — Telephone Encounter (Signed)
Called and spoke to Dominica, pt's daughter, she wasn't able to give detailed info about fathers condition. Unable to leave vm for father. Called other daughter, Sherlene Shams #[phone], Vicente Males stated that Mr. Lavell was experiencing productive cough with thick light green mucous, fatigue, chills without fever, DOE but feels SOB is d/t weather, s/s x 3 days. Pt hasn't been taking any OTC meds but is using maintenance inhalers and nebs when needed. There are no current openings in office today. Dr. Elsworth Soho please advise.

## 2014-02-07 NOTE — Telephone Encounter (Signed)
Pt's daughter returned triage's call.  Holly D Pryor ° °

## 2014-02-07 NOTE — Telephone Encounter (Signed)
lmomtcb for pt's daughter

## 2014-02-07 NOTE — Telephone Encounter (Signed)
zpak Prednisone 10 mg tabs  Take 2 tabs daily with food x 5ds, then 1 tab daily with food x 5ds then STOP OV when able with TP -last seen dec 2014

## 2014-02-21 ENCOUNTER — Encounter: Payer: Self-pay | Admitting: *Deleted

## 2014-02-21 ENCOUNTER — Telehealth: Payer: Self-pay | Admitting: Family Medicine

## 2014-02-21 NOTE — Telephone Encounter (Signed)
Patient's daughter called to schedule Jeneen Rinks an appointment with [place] for a swollen elbow. I informed patient that we did not have any available appointments but to follow up with an Urgent Care [facility]).

## 2014-03-02 ENCOUNTER — Ambulatory Visit: Payer: Medicare FFS | Admitting: Family Medicine

## 2014-03-03 ENCOUNTER — Ambulatory Visit (INDEPENDENT_AMBULATORY_CARE_PROVIDER_SITE_OTHER): Payer: Medicare FFS | Admitting: Family Medicine

## 2014-03-03 ENCOUNTER — Encounter: Payer: Self-pay | Admitting: Family Medicine

## 2014-03-03 VITALS — BP 120/70 | HR 69 | Temp 98.2°F | Resp 18 | Wt 194.0 lb

## 2014-03-03 DIAGNOSIS — L02519 Cutaneous abscess of unspecified hand: Secondary | ICD-10-CM

## 2014-03-03 DIAGNOSIS — Z23 Encounter for immunization: Secondary | ICD-10-CM

## 2014-03-03 DIAGNOSIS — L03119 Cellulitis of unspecified part of limb: Secondary | ICD-10-CM

## 2014-03-03 DIAGNOSIS — Z7901 Long term (current) use of anticoagulants: Secondary | ICD-10-CM

## 2014-03-03 LAB — POCT INR: INR: 4.8

## 2014-03-03 MED ORDER — CLINDAMYCIN HCL 150 MG PO CAPS: ORAL_CAPSULE | ORAL | Status: DC

## 2014-03-03 NOTE — Addendum Note (Signed)
Addended by: Harl Bowie on: 03/03/2014 04:44 PM   Modules accepted: Orders

## 2014-03-03 NOTE — Progress Notes (Signed)
Pre visit review using our clinic review tool, if applicable. No additional management support is needed unless otherwise documented below in the visit note. 

## 2014-03-03 NOTE — Progress Notes (Signed)
   Subjective:    Patient ID: Joe Jackson, male    DOB: 30-Aug-1929, 80 y.o.   MRN: [id number]  HPI Pt here with his daughter.  He pulled at a scab last week and his R wrist became red and swollen.  He would not go anywhere but here and would not see anyone but me which is why they are here today.  His daughter is a Marine scientist and started abx at home.  It looks better today per pt and daughter. Pt also need PT/ INR check.    Review of Systems    as above Objective:   Physical Exam BP 120/70  Pulse 69  Temp(Src) 98.2 F (36.8 C) (Oral)  Resp 18  Wt 194 lb (87.998 kg)  SpO2 95% General appearance: alert, cooperative, appears stated age and no distress Extremities: r wrist -- + red and swollen , warm to touch,  + scab medial wrist, no red streaking   INR  4.8    Assessment & Plan:  1. Long term (current) use of anticoagulants Hold coumadin today-- take 2.5mg  tomorrow and Sun and rto Monday for recheck - POCT INR  2. Cellulitis of wrist tdap given,  rto Monday for recheck-- if any increase in redness over weekend go to ER or UC - clindamycin (CLEOCIN) 150 MG capsule; 3 po tid x 10 days  Dispense: 90 capsule; Refill: 0

## 2014-03-03 NOTE — Patient Instructions (Signed)

## 2014-03-05 ENCOUNTER — Encounter (HOSPITAL_COMMUNITY): Payer: Self-pay | Admitting: Emergency Medicine

## 2014-03-05 ENCOUNTER — Emergency Department (HOSPITAL_COMMUNITY)
Admission: EM | Admit: 2014-03-05 | Discharge: 2014-03-05 | Disposition: A | Discharge status: Home / Self Care | Payer: Medicare FFS | Attending: Emergency Medicine | Admitting: Emergency Medicine

## 2014-03-05 DIAGNOSIS — I10 Essential (primary) hypertension: Secondary | ICD-10-CM | POA: Diagnosis not present

## 2014-03-05 DIAGNOSIS — Z87891 Personal history of nicotine dependence: Secondary | ICD-10-CM | POA: Diagnosis not present

## 2014-03-05 DIAGNOSIS — K219 Gastro-esophageal reflux disease without esophagitis: Secondary | ICD-10-CM | POA: Insufficient documentation

## 2014-03-05 DIAGNOSIS — J4489 Other specified chronic obstructive pulmonary disease: Secondary | ICD-10-CM | POA: Insufficient documentation

## 2014-03-05 DIAGNOSIS — R21 Rash and other nonspecific skin eruption: Secondary | ICD-10-CM | POA: Insufficient documentation

## 2014-03-05 DIAGNOSIS — Z79899 Other long term (current) drug therapy: Secondary | ICD-10-CM | POA: Diagnosis not present

## 2014-03-05 DIAGNOSIS — E785 Hyperlipidemia, unspecified: Secondary | ICD-10-CM | POA: Insufficient documentation

## 2014-03-05 DIAGNOSIS — I5042 Chronic combined systolic (congestive) and diastolic (congestive) heart failure: Secondary | ICD-10-CM | POA: Insufficient documentation

## 2014-03-05 DIAGNOSIS — T7840XA Allergy, unspecified, initial encounter: Secondary | ICD-10-CM

## 2014-03-05 DIAGNOSIS — T368X5A Adverse effect of other systemic antibiotics, initial encounter: Secondary | ICD-10-CM | POA: Insufficient documentation

## 2014-03-05 DIAGNOSIS — J449 Chronic obstructive pulmonary disease, unspecified: Secondary | ICD-10-CM | POA: Insufficient documentation

## 2014-03-05 DIAGNOSIS — IMO0002 Reserved for concepts with insufficient information to code with codable children: Secondary | ICD-10-CM | POA: Insufficient documentation

## 2014-03-05 DIAGNOSIS — Z792 Long term (current) use of antibiotics: Secondary | ICD-10-CM | POA: Diagnosis not present

## 2014-03-05 DIAGNOSIS — Z7901 Long term (current) use of anticoagulants: Secondary | ICD-10-CM | POA: Insufficient documentation

## 2014-03-05 DIAGNOSIS — I2510 Atherosclerotic heart disease of native coronary artery without angina pectoris: Secondary | ICD-10-CM | POA: Insufficient documentation

## 2014-03-05 LAB — PROTIME-INR
INR: 3.26 — ABNORMAL HIGH (ref 0.00–1.49)
Prothrombin Time: 33.2 seconds — ABNORMAL HIGH (ref 11.6–15.2)

## 2014-03-05 MED ORDER — FAMOTIDINE 20 MG PO TABS: 20.0000 mg | ORAL_TABLET | Freq: Two times a day (BID) | ORAL | Status: DC

## 2014-03-05 MED ORDER — METHYLPREDNISOLONE SODIUM SUCC 125 MG IJ SOLR
125.0000 mg | Freq: Once | INTRAMUSCULAR | Status: AC
  Administered 2014-03-05: 125 mg via INTRAVENOUS
  Filled 2014-03-05: qty 2

## 2014-03-05 MED ORDER — PREDNISONE 10 MG PO TABS: 40.0000 mg | ORAL_TABLET | Freq: Every day | ORAL | Status: AC

## 2014-03-05 MED ORDER — FAMOTIDINE IN NACL 20-0.9 MG/50ML-% IV SOLN
20.0000 mg | Freq: Once | INTRAVENOUS | Status: AC
  Administered 2014-03-05: 20 mg via INTRAVENOUS
  Filled 2014-03-05: qty 50

## 2014-03-05 MED ORDER — DIPHENHYDRAMINE HCL 25 MG PO TABS: 25.0000 mg | ORAL_TABLET | Freq: Four times a day (QID) | ORAL | Status: DC

## 2014-03-05 MED ORDER — DOXYCYCLINE HYCLATE 100 MG PO CAPS: 100.0000 mg | ORAL_CAPSULE | Freq: Two times a day (BID) | ORAL | Status: DC

## 2014-03-05 MED ORDER — SODIUM CHLORIDE 0.9 % IV BOLUS (SEPSIS)
250.0000 mL | Freq: Once | INTRAVENOUS | Status: AC
  Administered 2014-03-05: 250 mL via INTRAVENOUS

## 2014-03-05 NOTE — ED Notes (Signed)
Bed: WA20 Expected date:  Expected time:  Means of arrival:  Comments: Hold per charge

## 2014-03-05 NOTE — ED Notes (Signed)
Pt discharged to home with daughter. Pt resting comfortably in room. Pt. Given sandwich and drink. NAD att

## 2014-03-05 NOTE — Discharge Instructions (Signed)

## 2014-03-05 NOTE — ED Provider Notes (Signed)
CSN: [id number]     Arrival date & time 03/05/14  2019 History   First MD Initiated Contact with Patient 03/05/14 2046     Chief Complaint  Patient presents with  . Allergic Reaction   Patient is a 80 y.o. male presenting with allergic reaction.  Allergic Reaction Presenting symptoms: rash   Presenting symptoms: no wheezing     Patient is an 80 y.o. Male who presents to the ED with allergic reaction to clindamycin.  Patient was started on clindamycin Friday for cellulitis of his arm by his PCP.  Patient noticed that he had a little bit of a rash pop up two days ago.  Patient states that he broke out in a full rash today.  Patient states that he took 50 mg of benadryl twice today once at 1 pm and once at approximately 7 pm.  Patient states that his last dose of clindamycin was at 6 pm today.  Patient does have a history of COPD, CHF, afib currently on coumadin therapy, pulmonary fibrosis, HTN, CAD, and HL.    Past Medical History  Diagnosis Date  . Hypertension   . Hyperlipidemia   . Chronic combined systolic and diastolic CHF (congestive heart failure)     a. 10/2012 Echo: EF 40-45%, Gr1 dd, postlat AK, mild to mod AS, Triv AI, Mild MR, mildly dil LA.  . Sleep apnea   . Aortic aneurysm, abdominal     f/u by [facility]  . Pulmonary fibrosis   . Ischemic cardiomyopathy     a. EF 40-45% by echo 10/2012  . CAD (coronary artery disease)     a. s/p prior MI;  b. 11/1999 Cath: LM nl, LAD 92m, LCX min irregs, OM 100, RCA 80p, 100d, L->L and L->R collats, EF 54%.  Marland Kitchen COPD (chronic obstructive pulmonary disease)   . PAF (paroxysmal atrial fibrillation)     a. Dx 10/2012 during hosp for resp failure/copd flare  . Aortic stenosis     mild to mod by echo 10/2012 (mean gradient 14 mmHg)  . Shortness of breath   . GERD (gastroesophageal reflux disease)    Past Surgical History  Procedure Laterality Date  . Neck surgery    . Arm surgery    . Back surgery    . Leg surgery    . Cardioversion N/A 01/20/2013     Procedure: CARDIOVERSION;  Surgeon: Darlin Coco, MD;  Location: [facility] ENDOSCOPY;  Service: Cardiovascular;  Laterality: N/A;   Family History  Problem Relation Age of Onset  . Heart disease Mother     Heart Disease before age  23  . Other Mother     Rheumatism  . Heart attack Mother   . Heart attack Father    History  Substance Use Topics  . Smoking status: Former Smoker -- 0.80 packs/day for 78 years    Types: Cigarettes    Quit date: 08/05/2011  . Smokeless tobacco: Current User    Types: Chew    Last Attempt to Quit: 08/11/2011  . Alcohol Use: No    Review of Systems  Constitutional: Negative for fever, chills and fatigue.  HENT: Positive for facial swelling.   Respiratory: Negative for chest tightness, shortness of breath and wheezing.   Cardiovascular: Negative for chest pain, palpitations and leg swelling.  Gastrointestinal: Negative for abdominal pain.  Skin: Positive for color change and rash. Negative for pallor and wound.  All other systems reviewed and are negative.     Allergies  Clindamycin/lincomycin  and Ceftriaxone sodium in dextrose  Home Medications   Prior to Admission medications   Medication Sig Start Date End Date Taking? Authorizing Provider  bisoprolol (ZEBETA) 10 MG tablet TAKE 1 TABLET EVERY DAY   Yes Larey Dresser, MD  budesonide-formoterol [facility]) 160-4.5 MCG/ACT inhaler Inhale 2 puffs into the lungs 2 (two) times daily. 04/14/12  Yes Rosalita Chessman, DO  calcitRIOL (ROCALTROL) 0.25 MCG capsule Take 1 capsule by mouth daily. 11/19/13  Yes Historical Provider, MD  furosemide (LASIX) 40 MG tablet Take 60 mg by mouth 2 (two) times daily.   Yes Historical Provider, MD  guaiFENesin (MUCINEX) 600 MG 12 hr tablet Take 1,200 mg by mouth as needed for cough.    Yes Historical Provider, MD  levalbuterol (XOPENEX) 0.63 MG/3ML nebulizer solution Take 3 mLs (0.63 mg total) by nebulization every 6 (six) hours as needed for wheezing or shortness of  breath. DX 515 02/18/13  Yes Rigoberto Noel, MD  losartan (COZAAR) 25 MG tablet Take 12.5 mg by mouth daily.   Yes Historical Provider, MD  pravastatin (PRAVACHOL) 80 MG tablet TAKE 1 TABLET EVERY EVENING   Yes Jolaine Artist, MD  tiotropium (SPIRIVA HANDIHALER) 18 MCG inhalation capsule Place 1 capsule (18 mcg total) into inhaler and inhale daily. 10/28/13  Yes Rigoberto Noel, MD  warfarin (COUMADIN) 5 MG tablet Take 2.5-5 mg by mouth daily. M W F take 2.5 mg all other days 5mg    Yes Historical Provider, MD  diphenhydrAMINE (BENADRYL) 25 MG tablet Take 1 tablet (25 mg total) by mouth every 6 (six) hours. 03/05/14   Remington Skalsky A Forcucci, PA-C  doxycycline (VIBRAMYCIN) 100 MG capsule Take 1 capsule (100 mg total) by mouth 2 (two) times daily. 03/05/14   Yonah Tangeman A Forcucci, PA-C  famotidine (PEPCID) 20 MG tablet Take 1 tablet (20 mg total) by mouth 2 (two) times daily. 03/05/14   Nickoli Bagheri A Forcucci, PA-C  predniSONE (DELTASONE) 10 MG tablet Take 4 tablets (40 mg total) by mouth daily. 03/05/14 03/10/14  Shaylon Aden A Forcucci, PA-C   BP 146/82  Pulse 85  Temp(Src) 98.5 F (36.9 C) (Oral)  Resp 18  SpO2 94% Physical Exam  Nursing note and vitals reviewed. Constitutional: He is oriented to person, place, and time. He appears well-developed and well-nourished. No distress.  HENT:  Head: Normocephalic and atraumatic.  Mouth/Throat: Uvula is midline and oropharynx is clear and moist. No oropharyngeal exudate, posterior oropharyngeal edema or posterior oropharyngeal erythema.  Eyes: Conjunctivae and EOM are normal. Pupils are equal, round, and reactive to light. No scleral icterus.  Neck: Normal range of motion. Neck supple. No JVD present. No thyromegaly present.  Cardiovascular: Normal rate, regular rhythm, normal heart sounds and intact distal pulses.  Exam reveals no gallop and no friction rub.   No murmur heard. Pulmonary/Chest: Effort normal and breath sounds normal. No respiratory distress. He has no  wheezes. He has no rales. He exhibits no tenderness.  Abdominal: Soft. Bowel sounds are normal. He exhibits no distension and no mass. There is no tenderness. There is no rebound and no guarding.  Musculoskeletal: Normal range of motion.  Lymphadenopathy:    He has no cervical adenopathy.  Neurological: He is alert and oriented to person, place, and time. No cranial nerve deficit. Coordination normal.  Skin: Skin is warm and dry. Rash noted. He is not diaphoretic.  Erythematous macular rash consistent with urticaria located over the neck, arms, torso, and abdomen.  Rash has no evidence of  petechia, purpura, or hemorrhage.  Rash blanches with pressure.  Psychiatric: He has a normal mood and affect. His behavior is normal. Judgment and thought content normal.    ED Course  Procedures (including critical care time) Labs Review Labs Reviewed  PROTIME-INR - Abnormal; Notable for the following:    Prothrombin Time 33.2 (*)    INR 3.26 (*)    All other components within normal limits    Imaging Review No results found.   EKG Interpretation None      MDM   Final diagnoses:  Allergic reaction to drug   Patient is an 80 y.o male who presents to the ED with allergic reaction to clindamycin.  Patient is normotensive here at this time and there is no signs of anaphylaxis here at this time.  Patient was treated with here with IV fluids, 125 mg of solumedrol, and 20 mg pepcid.  Patients lungs are clear to auscultation at this time.  Patients daughter has requested that we retest his PT/INR as he was supratherapeutic on Friday.  PT/INR is 3.26 here in the ED.  I will discontinue clindamycin for the cellulitis on his arm and start doxycycline BID at this time.  Patient will be discharged home with benadryl, pepcid, and 40 mg Prednisone x 5 days.  Patient is stable for discharge at this time.  Patient is to follow-up with his PCP on Monday or Tuesday.  Patient has tolerated PO food and drink here.   He was told to return for anaphylactic symptoms.  He and his daughter state understanding at this time.  Patient was also seen by Dr. Canary Brim who agrees with the above plan at this time.      Cherylann Parr, PA-C 03/05/14 2314

## 2014-03-05 NOTE — ED Notes (Addendum)
Pt started taking clindamycin Friday but today he started having hives and itching all over his body. Has taken 100mg  benadryl today PO. No breathing difficulty. Alert and oriented.

## 2014-03-06 ENCOUNTER — Ambulatory Visit: Payer: Medicare FFS

## 2014-03-07 NOTE — ED Provider Notes (Signed)
Medical screening examination/treatment/procedure(s) were conducted as a shared visit with non-physician practitioner(s) and myself.  I personally evaluated the patient during the encounter.   EKG Interpretation None     Pt seen and evaluated, pt with scattered hives and rash over his face and chest.  Airway intact, no lip or tongue swelling.  Will stop clindamycin- area of cellulitis he was placed on this for is improved, will switch to doxycycline.  Pt treated for allergic reaction in the ED.  No signs of anaphylaxis.    Threasa Beards, MD 03/07/14 1501

## 2014-03-09 ENCOUNTER — Encounter (HOSPITAL_COMMUNITY): Payer: Medicare FFS

## 2014-03-10 ENCOUNTER — Ambulatory Visit (INDEPENDENT_AMBULATORY_CARE_PROVIDER_SITE_OTHER): Payer: Medicare FFS

## 2014-03-10 VITALS — BP 122/62 | HR 71 | Temp 97.7°F | Wt 196.2 lb

## 2014-03-10 DIAGNOSIS — Z5181 Encounter for therapeutic drug level monitoring: Secondary | ICD-10-CM

## 2014-03-10 DIAGNOSIS — D696 Thrombocytopenia, unspecified: Secondary | ICD-10-CM

## 2014-03-10 DIAGNOSIS — Z7901 Long term (current) use of anticoagulants: Secondary | ICD-10-CM

## 2014-03-10 DIAGNOSIS — I480 Paroxysmal atrial fibrillation: Secondary | ICD-10-CM

## 2014-03-10 DIAGNOSIS — I4891 Unspecified atrial fibrillation: Secondary | ICD-10-CM

## 2014-03-10 LAB — POCT INR
INR: 0
INR: 1.7

## 2014-03-10 NOTE — Progress Notes (Signed)
Pre visit review using our clinic review tool, if applicable. No additional management support is needed unless otherwise documented below in the visit note. 

## 2014-03-10 NOTE — Patient Instructions (Signed)
Take 5 mg of Coumadin tonight.  Then take 2.5 mg (1/2 tablet) Coumadin on Mondays and Wednesdays AND 5 mg Coumadin all other days.  Return to clinic to recheck  PT/INR in 2 weeks.

## 2014-03-24 ENCOUNTER — Ambulatory Visit (INDEPENDENT_AMBULATORY_CARE_PROVIDER_SITE_OTHER): Payer: Medicare FFS

## 2014-03-24 ENCOUNTER — Telehealth: Payer: Self-pay

## 2014-03-24 VITALS — BP 120/82 | HR 69 | Temp 97.9°F | Wt 190.0 lb

## 2014-03-24 DIAGNOSIS — D696 Thrombocytopenia, unspecified: Secondary | ICD-10-CM

## 2014-03-24 DIAGNOSIS — I480 Paroxysmal atrial fibrillation: Secondary | ICD-10-CM

## 2014-03-24 DIAGNOSIS — Z5181 Encounter for therapeutic drug level monitoring: Secondary | ICD-10-CM

## 2014-03-24 DIAGNOSIS — I4891 Unspecified atrial fibrillation: Secondary | ICD-10-CM

## 2014-03-24 DIAGNOSIS — Z7901 Long term (current) use of anticoagulants: Secondary | ICD-10-CM

## 2014-03-24 LAB — PROTIME-INR
INR: 6.1 ratio (ref 0.8–1.0)
PROTHROMBIN TIME: 64.7 s — AB (ref 9.6–13.1)

## 2014-03-24 LAB — POCT INR: INR: 7.3

## 2014-03-24 NOTE — Telephone Encounter (Signed)
See lab

## 2014-03-24 NOTE — Progress Notes (Signed)
Pre-visit discussion using our clinic review tool. No additional management support is needed unless otherwise documented below in the visit note.  

## 2014-03-24 NOTE — Patient Instructions (Addendum)
Will call with STAT results to daughter(Anna).

## 2014-03-24 NOTE — Telephone Encounter (Signed)
Notes Recorded by Rosalita Chessman, DO on 03/24/2014 at 4:46 PM Hold coumadin today and tomorrow---take 2.5 mg Sunday and repeat on Monday   Spoke with daughter and she wanted me to call back and leave the direction on her VM. Detailed message left on the voicemail with the above recommendations.     KP

## 2014-03-24 NOTE — Telephone Encounter (Signed)
Call form [facility] with a Critical PT/INR. PT 64.65 and INR 6.12. Please advise    KP

## 2014-03-27 ENCOUNTER — Telehealth: Payer: Self-pay | Admitting: Family Medicine

## 2014-03-27 NOTE — Telephone Encounter (Signed)
MSG left to call the office, VM was left to advise of results on Friday per daughter request     KP

## 2014-03-27 NOTE — Telephone Encounter (Signed)
Another message left to call the office     KP

## 2014-03-27 NOTE — Telephone Encounter (Signed)
Caller name: Wilhemena Durie  Relation to pt: self  Call back number: [phone]   Reason for call:   pt would like to touch base on coumadin check pt stated a nurse was suppose to call her back. i have  Friday 03/31/14 at 11am.

## 2014-03-28 NOTE — Telephone Encounter (Signed)
MSG left to call the office      KP 

## 2014-03-29 ENCOUNTER — Ambulatory Visit (INDEPENDENT_AMBULATORY_CARE_PROVIDER_SITE_OTHER): Payer: Medicare FFS

## 2014-03-29 VITALS — BP 116/56 | HR 76 | Temp 98.0°F | Wt 190.0 lb

## 2014-03-29 DIAGNOSIS — Z5181 Encounter for therapeutic drug level monitoring: Secondary | ICD-10-CM

## 2014-03-29 DIAGNOSIS — I480 Paroxysmal atrial fibrillation: Secondary | ICD-10-CM

## 2014-03-29 DIAGNOSIS — D696 Thrombocytopenia, unspecified: Secondary | ICD-10-CM

## 2014-03-29 DIAGNOSIS — I4891 Unspecified atrial fibrillation: Secondary | ICD-10-CM

## 2014-03-29 DIAGNOSIS — Z7901 Long term (current) use of anticoagulants: Secondary | ICD-10-CM

## 2014-03-29 LAB — POCT INR: INR: 4.9

## 2014-03-29 NOTE — Telephone Encounter (Signed)
MSG left to call the office.    KP    I tried calling the other number and got Joe Jackson, she is not on the [facility] but I made her aware I was returning the call to Joe Jackson, she said Joe Jackson was upset, she said she called on Monday to get an appointment and was told we did not have have any until September, I made her aware he has  Cardiology apt in Sept and asked could that been the office that was called and she said No, she said she called an made that one, but Joe Jackson called here. She said Joe Jackson blessed out whoever she spoke with on the phone because she was told we could not see or work her father in to check his levels. She was concerned since she is an Therapist, sports and knows that it is important to have his INR rechecked.  I apologized for the inconvenience, I made her aware I left the VM on Friday and she said she did get that, she said he did not take any med's on Sat or Sunday but resumed his dose on Monday. I asked could she bring him in today and she said she was at work but she will see if her husband could bring him, I told her bring him in at anytime and I will be willing to stop what I am doing to check the INR levels, she said she would call her husband I scheduled the appointment for 3:30 today but did advised the patient to come in due to the nature of the situation.       KP

## 2014-03-29 NOTE — Telephone Encounter (Signed)
Tiffany spoke with her-- according to the chart

## 2014-03-29 NOTE — Telephone Encounter (Signed)
Not sure what office she called, but no one on my staff spoke to patient or anyone in patients family.

## 2014-03-29 NOTE — Patient Instructions (Signed)
Today your INR level was 4.9 which is still high! Dr.Tabori recommends that you HOLD the coumadin tonight (03/29/14) and resume tomorrow with 2.5 mg. Take the 2.5 mg for the rest of the week and we will recheck the INR levels next week. If you have any questions or concerns please call the office.

## 2014-04-01 ENCOUNTER — Other Ambulatory Visit: Payer: Self-pay | Admitting: Cardiology

## 2014-04-05 ENCOUNTER — Ambulatory Visit (INDEPENDENT_AMBULATORY_CARE_PROVIDER_SITE_OTHER): Payer: Medicare FFS

## 2014-04-05 VITALS — BP 120/78 | HR 79 | Temp 98.0°F | Wt 191.2 lb

## 2014-04-05 DIAGNOSIS — I4891 Unspecified atrial fibrillation: Secondary | ICD-10-CM

## 2014-04-05 DIAGNOSIS — D696 Thrombocytopenia, unspecified: Secondary | ICD-10-CM

## 2014-04-05 DIAGNOSIS — Z7901 Long term (current) use of anticoagulants: Secondary | ICD-10-CM

## 2014-04-05 DIAGNOSIS — Z5181 Encounter for therapeutic drug level monitoring: Secondary | ICD-10-CM

## 2014-04-05 DIAGNOSIS — I480 Paroxysmal atrial fibrillation: Secondary | ICD-10-CM

## 2014-04-05 LAB — POCT INR: INR: 4

## 2014-04-05 NOTE — Patient Instructions (Addendum)
Hold today 9/2 and tomorrow 9/3 then resume with 2.5mg  daily and recheck in one week.  Appt: Wed 9/9 at 2pm

## 2014-04-05 NOTE — Progress Notes (Signed)
Pre visit review using our clinic review tool, if applicable. No additional management support is needed unless otherwise documented below in the visit note. 

## 2014-04-12 ENCOUNTER — Ambulatory Visit: Payer: Medicare FFS

## 2014-04-12 ENCOUNTER — Ambulatory Visit (HOSPITAL_COMMUNITY)
Admission: RE | Admit: 2014-04-12 | Discharge: 2014-04-12 | Disposition: A | Discharge status: Home / Self Care | Payer: Medicare FFS | Source: Ambulatory Visit | Attending: Internal Medicine | Admitting: Internal Medicine

## 2014-04-12 ENCOUNTER — Encounter (HOSPITAL_COMMUNITY): Payer: Self-pay

## 2014-04-12 ENCOUNTER — Ambulatory Visit (INDEPENDENT_AMBULATORY_CARE_PROVIDER_SITE_OTHER): Payer: Medicare FFS

## 2014-04-12 VITALS — BP 105/67 | HR 84 | Temp 98.1°F | Wt 193.6 lb

## 2014-04-12 VITALS — BP 128/70 | HR 79 | Wt 192.4 lb

## 2014-04-12 DIAGNOSIS — I482 Chronic atrial fibrillation, unspecified: Secondary | ICD-10-CM

## 2014-04-12 DIAGNOSIS — I129 Hypertensive chronic kidney disease with stage 1 through stage 4 chronic kidney disease, or unspecified chronic kidney disease: Secondary | ICD-10-CM | POA: Insufficient documentation

## 2014-04-12 DIAGNOSIS — I2510 Atherosclerotic heart disease of native coronary artery without angina pectoris: Secondary | ICD-10-CM | POA: Diagnosis not present

## 2014-04-12 DIAGNOSIS — I712 Thoracic aortic aneurysm, without rupture, unspecified: Secondary | ICD-10-CM | POA: Insufficient documentation

## 2014-04-12 DIAGNOSIS — I359 Nonrheumatic aortic valve disorder, unspecified: Secondary | ICD-10-CM | POA: Diagnosis not present

## 2014-04-12 DIAGNOSIS — E785 Hyperlipidemia, unspecified: Secondary | ICD-10-CM | POA: Insufficient documentation

## 2014-04-12 DIAGNOSIS — Z5181 Encounter for therapeutic drug level monitoring: Secondary | ICD-10-CM

## 2014-04-12 DIAGNOSIS — J449 Chronic obstructive pulmonary disease, unspecified: Secondary | ICD-10-CM | POA: Diagnosis not present

## 2014-04-12 DIAGNOSIS — I739 Peripheral vascular disease, unspecified: Secondary | ICD-10-CM | POA: Insufficient documentation

## 2014-04-12 DIAGNOSIS — I714 Abdominal aortic aneurysm, without rupture, unspecified: Secondary | ICD-10-CM | POA: Insufficient documentation

## 2014-04-12 DIAGNOSIS — I5022 Chronic systolic (congestive) heart failure: Secondary | ICD-10-CM | POA: Diagnosis present

## 2014-04-12 DIAGNOSIS — I509 Heart failure, unspecified: Secondary | ICD-10-CM

## 2014-04-12 DIAGNOSIS — N183 Chronic kidney disease, stage 3 unspecified: Secondary | ICD-10-CM | POA: Insufficient documentation

## 2014-04-12 DIAGNOSIS — I5042 Chronic combined systolic (congestive) and diastolic (congestive) heart failure: Secondary | ICD-10-CM

## 2014-04-12 DIAGNOSIS — I4891 Unspecified atrial fibrillation: Secondary | ICD-10-CM | POA: Diagnosis not present

## 2014-04-12 DIAGNOSIS — J4489 Other specified chronic obstructive pulmonary disease: Secondary | ICD-10-CM | POA: Insufficient documentation

## 2014-04-12 DIAGNOSIS — I7121 Aneurysm of the ascending aorta, without rupture: Secondary | ICD-10-CM

## 2014-04-12 DIAGNOSIS — D696 Thrombocytopenia, unspecified: Secondary | ICD-10-CM

## 2014-04-12 DIAGNOSIS — I480 Paroxysmal atrial fibrillation: Secondary | ICD-10-CM

## 2014-04-12 DIAGNOSIS — Z7901 Long term (current) use of anticoagulants: Secondary | ICD-10-CM

## 2014-04-12 LAB — BASIC METABOLIC PANEL
Anion gap: 12 (ref 5–15)
BUN: 38 mg/dL — ABNORMAL HIGH (ref 6–23)
CO2: 30 mEq/L (ref 19–32)
CREATININE: 1.99 mg/dL — AB (ref 0.50–1.35)
Calcium: 9.4 mg/dL (ref 8.4–10.5)
Chloride: 103 mEq/L (ref 96–112)
GFR calc Af Amer: 34 mL/min — ABNORMAL LOW (ref >90)
GFR, EST NON AFRICAN AMERICAN: 30 mL/min — AB (ref >90)
Glucose, Bld: 93 mg/dL (ref 70–99)
Potassium: 4 mEq/L (ref 3.7–5.3)
SODIUM: 145 meq/L (ref 137–147)

## 2014-04-12 LAB — POCT INR: INR: 1.7

## 2014-04-12 NOTE — Patient Instructions (Signed)
Doing well.  Follow up with Dr. Aundra Dubin in 2 months with ECHO  Call any issues and call about medications that need to be refilled.  Do the following things EVERYDAY: 1) Weigh yourself in the morning before breakfast. Write it down and keep it in a log. 2) Take your medicines as prescribed 3) Eat low salt foods-Limit salt (sodium) to 2000 mg per day.  4) Stay as active as you can everyday 5) Limit all fluids for the day to less than 2 liters 6)
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
## 2014-04-12 NOTE — Progress Notes (Signed)
Pre visit review using our clinic review tool, if applicable. No additional management support is needed unless otherwise documented below in the visit note. 

## 2014-04-12 NOTE — Patient Instructions (Addendum)
Continue to take Coumadin 2.5 mg every day.  Recheck in 1 week (04/20/14 @ 2 pm).

## 2014-04-15 ENCOUNTER — Other Ambulatory Visit: Payer: Self-pay | Admitting: Cardiology

## 2014-04-20 ENCOUNTER — Ambulatory Visit: Payer: Medicare FFS

## 2014-05-03 ENCOUNTER — Other Ambulatory Visit: Payer: Self-pay

## 2014-05-03 MED ORDER — PRAVASTATIN SODIUM 80 MG PO TABS: ORAL_TABLET | ORAL | Status: DC

## 2014-05-03 MED ORDER — FUROSEMIDE 40 MG PO TABS: 60.0000 mg | ORAL_TABLET | Freq: Two times a day (BID) | ORAL | Status: DC

## 2014-06-05 ENCOUNTER — Ambulatory Visit (HOSPITAL_BASED_OUTPATIENT_CLINIC_OR_DEPARTMENT_OTHER)
Admission: RE | Admit: 2014-06-05 | Discharge: 2014-06-05 | Disposition: A | Discharge status: Home / Self Care | Payer: Medicare FFS | Source: Ambulatory Visit | Attending: Family Medicine | Admitting: Family Medicine

## 2014-06-05 ENCOUNTER — Encounter: Payer: Self-pay | Admitting: Family Medicine

## 2014-06-05 ENCOUNTER — Ambulatory Visit (INDEPENDENT_AMBULATORY_CARE_PROVIDER_SITE_OTHER): Payer: Medicare FFS | Admitting: Family Medicine

## 2014-06-05 ENCOUNTER — Ambulatory Visit: Payer: Medicare FFS

## 2014-06-05 ENCOUNTER — Ambulatory Visit (INDEPENDENT_AMBULATORY_CARE_PROVIDER_SITE_OTHER): Payer: Medicare FFS | Admitting: *Deleted

## 2014-06-05 VITALS — BP 120/72 | HR 75 | Temp 97.7°F | Wt 194.0 lb

## 2014-06-05 DIAGNOSIS — M7989 Other specified soft tissue disorders: Secondary | ICD-10-CM | POA: Diagnosis not present

## 2014-06-05 DIAGNOSIS — Z23 Encounter for immunization: Secondary | ICD-10-CM

## 2014-06-05 DIAGNOSIS — L259 Unspecified contact dermatitis, unspecified cause: Secondary | ICD-10-CM

## 2014-06-05 DIAGNOSIS — M25571 Pain in right ankle and joints of right foot: Secondary | ICD-10-CM

## 2014-06-05 DIAGNOSIS — Z7901 Long term (current) use of anticoagulants: Secondary | ICD-10-CM

## 2014-06-05 LAB — POCT INR: INR: 3

## 2014-06-05 MED ORDER — HYDROCORTISONE VALERATE 0.2 % EX CREA: 1.0000 "application " | TOPICAL_CREAM | Freq: Two times a day (BID) | CUTANEOUS | Status: DC

## 2014-06-05 NOTE — Patient Instructions (Addendum)
Warfarin: What You Need to Know Warfarin is an anticoagulant. Anticoagulants help prevent the formation of blood clots. They also help stop the growth of blood clots. Warfarin is sometimes referred to as a "blood thinner."  Normally, when body tissues are cut or damaged, the blood clots in order to prevent blood loss. Sometimes clots form inside your blood vessels and obstruct the flow of blood through your circulatory system (thrombosis). These clots may travel through your bloodstream and become lodged in smaller blood vessels in your brain, which can cause a stroke, or in your lungs (pulmonary embolism). WHO SHOULD USE WARFARIN? Warfarin is prescribed for people at risk of developing harmful blood clots:  People with surgically implanted mechanical heart valves, irregular heart rhythms called atrial fibrillation, and certain clotting disorders.  People who have developed harmful blood clotting in the past, including those who have had a stroke or a pulmonary embolism, or thrombosis in their legs (deep vein thrombosis [DVT]).  People with an existing blood clot, such as a pulmonary embolism. WARFARIN DOSING Warfarin tablets come in different strengths. Each tablet strength is a different color, with the amount of warfarin (in milligrams) clearly printed on the tablet. If the color of your tablet is different than usual when you receive a new prescription, report it immediately to your pharmacist or health care provider. WARFARIN MONITORING The goal of warfarin therapy is to lessen the clotting tendency of blood but not prevent clotting completely. Your health care provider will monitor the anticoagulation effect of warfarin closely and adjust your dose as needed. For your safety, blood tests called prothrombin time (PT) or international normalized ratio (INR) are used to measure the effects of warfarin. Both of these tests can be done with a finger stick or a blood draw. The longer it takes the  blood to clot, the higher the PT or INR. Your health care provider will inform you of your "target" PT or INR range. If, at any time, your PT or INR is above the target range, there is a risk of bleeding. If your PT or INR is below the target range, there is a risk of clotting. Whether you are started on warfarin while you are in the hospital or in your health care provider's office, you will need to have your PT or INR checked within one week of starting the medicine. Initially, some people are asked to have their PT or INR checked as much as twice a week. Once you are on a stable maintenance dose, the PT or INR is checked less often, usually once every 2 to 4 weeks. The warfarin dose may be adjusted if the PT or INR is not within the target range. It is important to keep all laboratory and health care provider follow-up appointments. Not keeping appointments could result in a chronic or permanent injury, pain, or disability because warfarin is a medicine that requires close monitoring. WHAT ARE THE SIDE EFFECTS OF WARFARIN?  Too much warfarin can cause bleeding (hemorrhage) from any part of the body. This may include bleeding from the gums, blood in the urine, bloody or dark stools, a nosebleed that is not easily stopped, coughing up blood, or vomiting blood.  Too little warfarin can increase the risk of blood clots.  Too little or too much warfarin can also increase the risk of a stroke.  Warfarin use may cause a skin rash or irritation, an unusual fever, continual nausea or stomach upset, or severe pain in your joints or back.   SPECIAL PRECAUTIONS WHILE TAKING WARFARIN Warfarin should be taken exactly as directed. It is very important to take warfarin as directed since bleeding or blood clots could result in chronic or permanent injury, pain, or disability.  Take your medicine at the same time every day. If you forget to take your dose, you can take it if it is within 6 hours of when it was  due.  Do not change the dose of warfarin on your own to make up for missed or extra doses.  If you miss more than 2 doses in a row, you should contact your health care provider for advice. Avoid situations that cause bleeding. You may have a tendency to bleed more easily than usual while taking warfarin. The following actions can limit bleeding:  Using a softer toothbrush.  Flossing with waxed floss rather than unwaxed floss.  Shaving with an electric razor rather than a blade.  Limiting the use of sharp objects.  Avoiding potentially harmful activities, such as contact sports. Warfarin and Pregnancy or Breastfeeding  Warfarin is not advised during the first trimester of pregnancy due to an increased risk of birth defects. In certain situations, a woman may take warfarin after her first trimester of pregnancy. A woman who becomes pregnant or plans to become pregnant while taking warfarin should notify her health care provider immediately.  Although warfarin does not pass into breast milk, a woman who wishes to breastfeed while taking warfarin should also consult with her health care provider. Alcohol, Smoking, and Illicit Drug Use  Alcohol affects how warfarin works in the body. It is best to avoid alcoholic drinks or consume very small amounts while taking warfarin. In general, alcohol intake should be limited to 1 oz (30 mL) of liquor, 6 oz (180 mL) of wine, or 12 oz (360 mL) of beer each day. Notify your health care provider if you change your alcohol intake.  Smoking affects how warfarin works. It is best to avoid smoking while taking warfarin. Notify your health care provider if you change your smoking habits.  It is best to avoid all illicit drugs while taking warfarin since there are few studies that show how warfarin interacts with these drugs. Other Medicines and Dietary Supplements Many prescription and over-the-counter medicines can interfere with warfarin. Be sure all of your  health care providers know you are taking warfarin. Notify your health care provider who prescribed warfarin for you or your pharmacist before starting or stopping any new medicines, including over-the-counter vitamins, dietary supplements, and pain medicines. Your warfarin dose may need to be adjusted. Some common over-the-counter medicines that may increase the risk of bleeding while taking warfarin include:   Acetaminophen.  Aspirin.  Nonsteroidal anti-inflammatory medicines (NSAIDs), such as ibuprofen or naproxen.  Vitamin E. Dietary Considerations  Foods that have moderate or high amounts of vitamin K can interfere with warfarin. Avoid major changes in your diet or notify your health care provider before changing your diet. Eat a consistent amount of foods that have moderate or high amounts of vitamin K. Eating less foods containing vitamin K can increase the risk of bleeding. Eating more foods containing vitamin K can increase the risk of blood clots. Additional questions about dietary considerations can be discussed with a dietitian. Foods that are very high in vitamin K:  Greens, such as Swiss chard and beet, collard, mustard, or turnip greens (fresh or frozen, cooked).  Kale (fresh or frozen, cooked).  Parsley (raw).  Spinach (cooked). Foods that are high   in vitamin K:  Asparagus (frozen, cooked).  Beans, green (frozen, cooked).  Broccoli.  Bok choy (cooked).  Brussels sprouts (fresh or frozen, cooked).  Cabbage (cooked).   Coleslaw. Foods that are moderately high in vitamin K:  Blueberries.  Black-eyed peas.  Endive (raw).  Green leaf lettuce (raw).  Green scallions (raw).  Kale (raw).  Okra (frozen, cooked).  Plantains (fried).  Romaine lettuce (raw).  Sauerkraut (canned).  Spinach (raw). CALL YOUR CLINIC OR HEALTH CARE PROVIDER IF YOU:  Plan to have any surgery or procedure.  Feel sick, especially if you have diarrhea or  vomiting.  Experience or anticipate any major changes in your diet.  Start or stop a prescription or over-the-counter medicine.  Become, plan to become, or think you may be pregnant.  Are having heavier than usual menstrual periods.  Have had a fall, accident, or any symptoms of bleeding or unusual bruising.  Develop an unusual fever. CALL 911 IN THE [place] OR GO TO THE EMERGENCY DEPARTMENT IF YOU:   Think you may be having an allergic reaction to warfarin. The signs of an allergic reaction could include itching, rash, hives, swelling, chest tightness, or trouble breathing.  See signs of blood in your urine. The signs could include reddish, pinkish, or tea-colored urine.  See signs of blood in your stools. The signs could include bright red or black stools.  Vomit or cough up blood. In these instances, the blood could have either a bright red or a "coffee-grounds" appearance.  Have bleeding that will not stop after applying pressure for 30 minutes such as cuts, nosebleeds, or other injuries.  Have severe pain in your joints or back.  Have a new and severe headache.  Have sudden weakness or numbness of your face, arm, or leg, especially on one side of your body.  Have sudden confusion or trouble understanding.  Have sudden trouble seeing in one or both eyes.  Have sudden trouble walking, dizziness, loss of balance, or coordination.  Have trouble speaking or understanding (aphasia). Document Released: 07/21/2005 Document Revised: 12/05/2013 Document Reviewed: 01/14/2013 [employer] Patient Information 2015 [employer]. This information is not intended to replace advice given to you by your health care provider. Make sure you discuss any questions you have with your health care provider.  

## 2014-06-05 NOTE — Progress Notes (Signed)
   Subjective:    Patient ID: Joe Jackson, male    DOB: [birth year], 80 y.o.   MRN: [id number]  HPI Pt here with his daughter c/o R ankle pain.  No known injury.  He has a history of surgery on that ankle years ago and has a pin in it.  They have noticed swelling and pain-- no errythema, no inc temp.   He is also breaking out on L flank/ and and low back and arms--- very itchy.  No new detergents, chemicals, soaps etc.  Pt has been working with walnuts and copper wire but that is not new.  Also check INR.   Review of Systems As above    Objective:   Physical Exam BP 120/72 mmHg  Pulse 75  Temp(Src) 97.7 F (36.5 C) (Oral)  Wt 194 lb (87.998 kg)  SpO2 98% General appearance: alert, cooperative, appears stated age and no distress Extremities: edema R ankle--lat malleous ,  no pain with palpation Skin-- + escoriations L abd and flank and low back       Assessment & Plan:  1. Long-term (current) use of anticoagulants con't current dose--recheck 1 month - POCT INR-- 3.0  2. Contact dermatitis  - hydrocortisone valerate cream (WESTCORT) 0.2 %; Apply 1 application topically 2 (two) times daily.  Dispense: 45 g; Refill: 0  3. Pain in right ankle ?gout vs arthritis No known injury - DG Ankle Complete Right; Future - Uric acid - Basic metabolic panel

## 2014-06-05 NOTE — Progress Notes (Signed)
Pre visit review using our clinic review tool, if applicable. No additional management support is needed unless otherwise documented below in the visit note. 

## 2014-06-06 LAB — BASIC METABOLIC PANEL
BUN: 29 mg/dL — ABNORMAL HIGH (ref 6–23)
CHLORIDE: 104 meq/L (ref 96–112)
CO2: 29 mEq/L (ref 19–32)
CREATININE: 1.8 mg/dL — AB (ref 0.4–1.5)
Calcium: 9.7 mg/dL (ref 8.4–10.5)
GFR: 38.03 mL/min — ABNORMAL LOW (ref >60.00)
GLUCOSE: 73 mg/dL (ref 70–99)
Potassium: 4.1 mEq/L (ref 3.5–5.1)
Sodium: 145 mEq/L (ref 135–145)

## 2014-06-06 LAB — URIC ACID: Uric Acid, Serum: 10.3 mg/dL — ABNORMAL HIGH (ref 4.0–7.8)

## 2014-06-09 ENCOUNTER — Telehealth: Payer: Self-pay | Admitting: Family Medicine

## 2014-06-09 MED ORDER — ALLOPURINOL 100 MG PO TABS: 100.0000 mg | ORAL_TABLET | Freq: Every day | ORAL | Status: DC

## 2014-06-09 NOTE — Telephone Encounter (Signed)
Caller name: Vicente Males Relation to pt: daughter Call back number:  [phone] Pharmacy:  Reason for call:    Patient daughter requesting last lab results

## 2014-06-09 NOTE — Telephone Encounter (Signed)
+   g0ut---- but kidney function is also very abnormal--- pt was referred to nephrologist but it does not look like he went We need to refer again Allopurinol 100 mg #30 1 po qd , 2 refills---  Recheck uric acid and bmp in 1 month    Spoke with Vicente Males and she voiced understanding, in reference to the patients labs. She will start him on the medication for Gout and will have him follow up with Dr.Mattingly.     KP

## 2014-06-13 ENCOUNTER — Ambulatory Visit (HOSPITAL_COMMUNITY): Admission: RE | Admit: 2014-06-13 | Payer: Medicare FFS | Source: Ambulatory Visit

## 2014-06-13 ENCOUNTER — Encounter (HOSPITAL_COMMUNITY): Payer: Medicare FFS

## 2014-06-21 IMAGING — CT CT CERVICAL SPINE W/O CM
2 series · 10 of 14 positions shown, 12 images · non-contrast
Comparison: Cervical spine radiographs 05/27/2011

CLINICAL DATA: Neck trauma. Patient unable tear current hand.
History of neck surgery in 9661.

EXAM:
CT CERVICAL SPINE WITHOUT CONTRAST
TECHNIQUE: Multidetector CT imaging of the cervical spine was performed without
intravenous contrast. Multiplanar CT image reconstructions were also
generated.

[Series 3: axial reformats · axial · 0.23mm/px · z∈[-231,-98]mm · 6 of 105 slices shown, 8 images]
[im 15/105  soft-tissue]
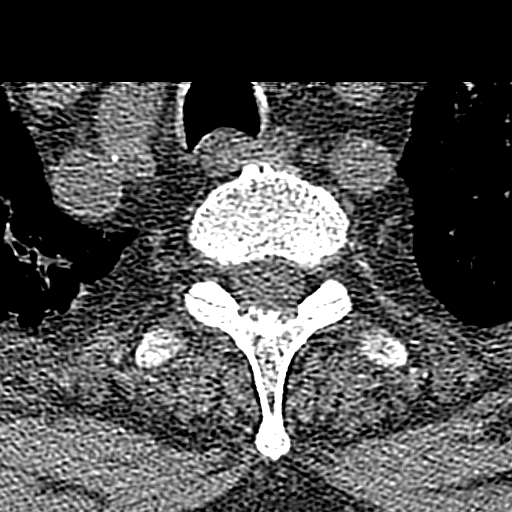
[im 15/105  bone]
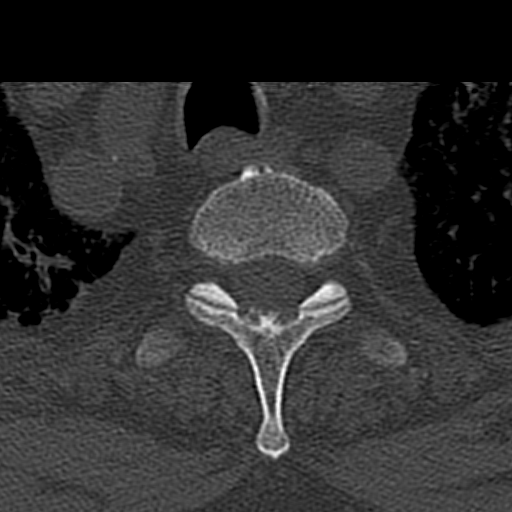
[im 30/105  bone]
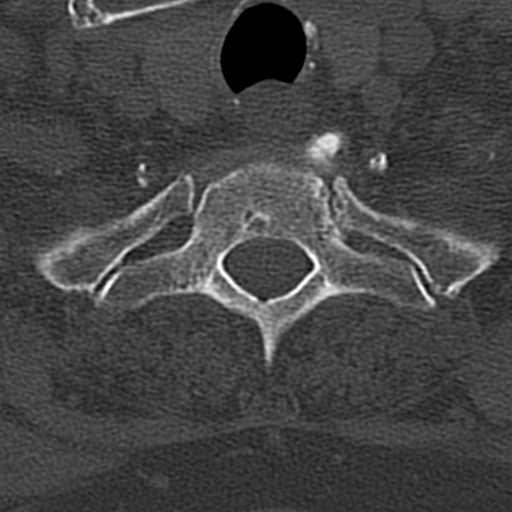
[im 45/105  bone]
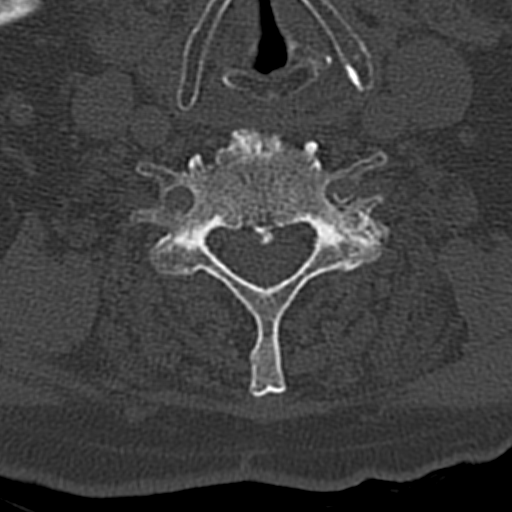
[im 60/105  bone]
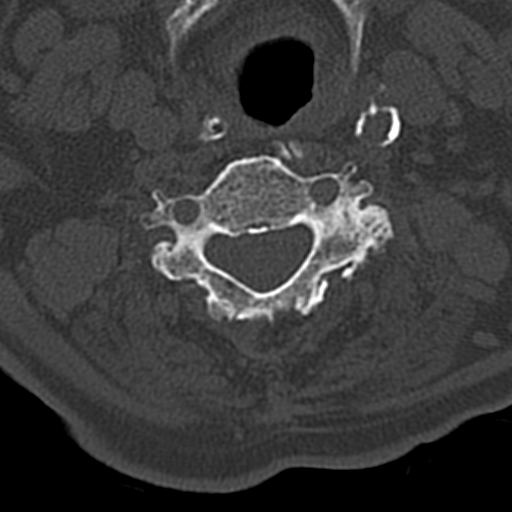
[im 75/105  soft-tissue]
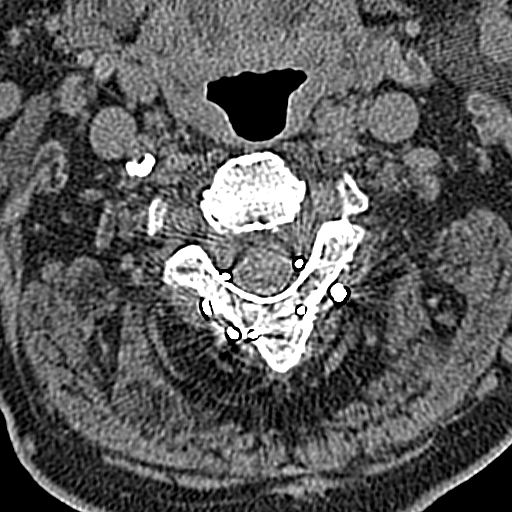
[im 75/105  bone]
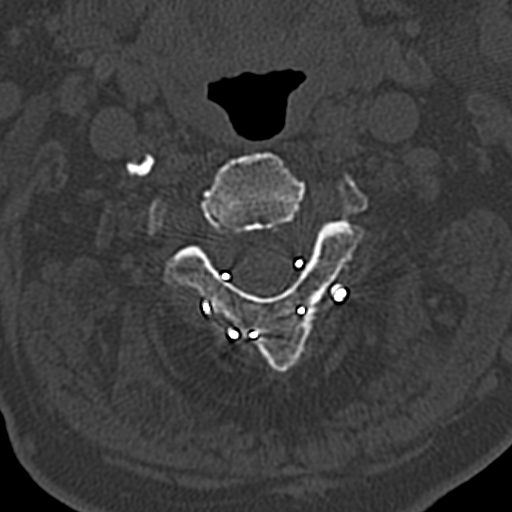
[im 90/105  bone]
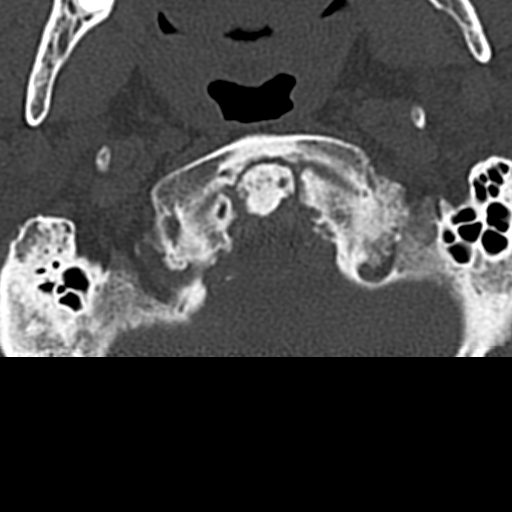

[Series 4: c-spine st · axial · 0.24mm/px · z∈[-188,-92]mm · 4 of 80 slices shown]
[im 16/80  bone]
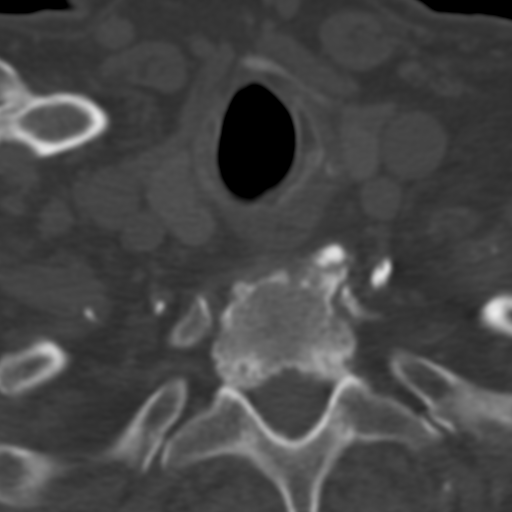
[im 32/80  bone]
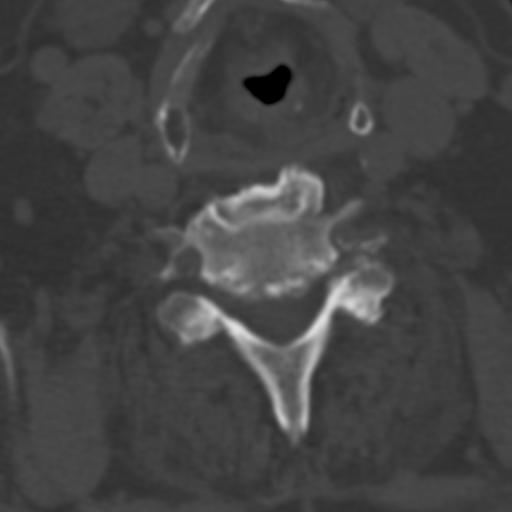
[im 48/80  bone]
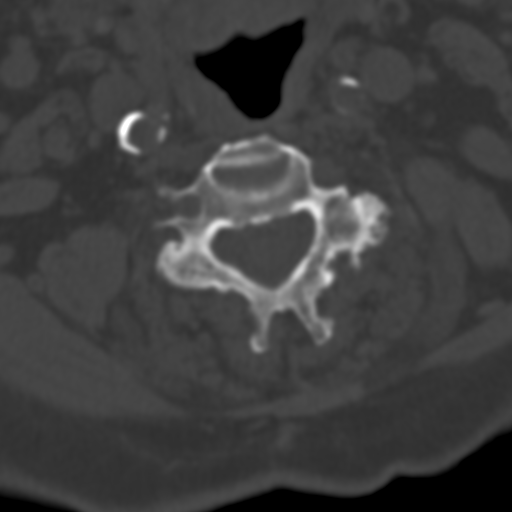
[im 64/80  bone]
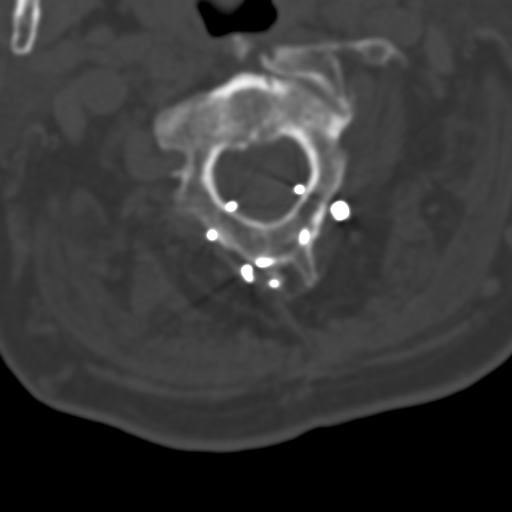

[10 of 14 positions shown; findings below may reference images not displayed]

FINDINGS: Cervical spine is imaged from the skullbase through T2 vertebral
body.

There is posterior cerclage wire fixation spanning C1 through C3.
There are degenerative changes at the C1-C2 articulation. The facet
joints of C2 and C3 appear fused. There are facet joint degenerative
changes bilaterally at C4-C5. Prominent facet joint hypertrophy on
the left at C4-C5 results in moderate-to-severe left neural
foraminal narrowing.

There is moderate disc height narrowing with posterior and anterior
osseous spurring at C5-C6. There is moderate bilateral neural
foraminal narrowing at C5-C6.

The C6 and C7 vertebral bodies are fused (without hardware). There
is mild bilateral neural foraminal narrowing at C6-C7.

There is moderate disc height narrowing and anterior and posterior
osseous spurring at C7-T1 and T1-T2.

Negative for cervical spine fracture.

Prevertebral soft tissue contour is normal. Thyroid gland within
normal limits. Negative for lymphadenopathy.

There is atherosclerotic calcification of the carotid artery bulbs
bilaterally.

There is pleural parenchymal scarring at the right lung apex.
IMPRESSION: 1. No acute bony abnormality.
2. Multilevel degenerative changes as described above.
3. Postoperative changes of posterior cerclage wire fusion spanning
C1 through C3. Please note that there is ankylosis of the facet
joints bilaterally at C2-C3.
4. Fusion of the C6 and C7 vertebral bodies.

## 2014-07-04 ENCOUNTER — Telehealth: Payer: Self-pay | Admitting: Family Medicine

## 2014-07-04 ENCOUNTER — Other Ambulatory Visit: Payer: Self-pay | Admitting: Family Medicine

## 2014-07-04 DIAGNOSIS — L299 Pruritus, unspecified: Secondary | ICD-10-CM

## 2014-07-04 NOTE — Telephone Encounter (Signed)
Joe Jackson aware Referral put in, please call her with apt.      KP

## 2014-07-04 NOTE — Telephone Encounter (Signed)
Please advise      KP 

## 2014-07-04 NOTE — Telephone Encounter (Signed)
Referral in

## 2014-07-04 NOTE — Telephone Encounter (Signed)
Caller name: Park, Beck Relation to pt: daughter Call back [place]:  Reason for call: daughter states that dr. Etter Sjogren has been seeing pt for the itching entire body, dr. Etter Sjogren had mention a dermatologist. Daughter would like to get the referral, pt has Mcarthur Rossetti, daughter states if he need to see her again prior, just let her know. Pt is still itching a lot and no medication is helping

## 2014-07-18 NOTE — Telephone Encounter (Signed)
Prior note error/Pt is scheduled to see Dr Elvera Lennox 07/18/14 @2 :10/Marji gave appt into to Ms. Glaus

## 2014-07-18 NOTE — Telephone Encounter (Signed)
Referral faxed to [facility] awaiting appt

## 2014-09-25 ENCOUNTER — Other Ambulatory Visit: Payer: Self-pay | Admitting: Cardiology

## 2014-10-14 ENCOUNTER — Other Ambulatory Visit: Payer: Self-pay | Admitting: Family Medicine

## 2014-10-19 ENCOUNTER — Other Ambulatory Visit: Payer: Self-pay | Admitting: Family Medicine

## 2014-10-29 ENCOUNTER — Other Ambulatory Visit: Payer: Self-pay | Admitting: Internal Medicine

## 2014-10-30 ENCOUNTER — Encounter: Payer: Self-pay | Admitting: Family Medicine

## 2014-10-30 ENCOUNTER — Telehealth: Payer: Self-pay | Admitting: Family Medicine

## 2014-10-30 ENCOUNTER — Ambulatory Visit (INDEPENDENT_AMBULATORY_CARE_PROVIDER_SITE_OTHER): Payer: Medicare FFS | Admitting: Family Medicine

## 2014-10-30 VITALS — BP 118/68 | HR 64 | Temp 97.2°F | Wt 196.8 lb

## 2014-10-30 DIAGNOSIS — E1129 Type 2 diabetes mellitus with other diabetic kidney complication: Secondary | ICD-10-CM

## 2014-10-30 DIAGNOSIS — IMO0002 Reserved for concepts with insufficient information to code with codable children: Secondary | ICD-10-CM

## 2014-10-30 DIAGNOSIS — N183 Chronic kidney disease, stage 3 unspecified: Secondary | ICD-10-CM

## 2014-10-30 DIAGNOSIS — E1165 Type 2 diabetes mellitus with hyperglycemia: Secondary | ICD-10-CM

## 2014-10-30 DIAGNOSIS — I480 Paroxysmal atrial fibrillation: Secondary | ICD-10-CM

## 2014-10-30 DIAGNOSIS — I5022 Chronic systolic (congestive) heart failure: Secondary | ICD-10-CM

## 2014-10-30 DIAGNOSIS — I10 Essential (primary) hypertension: Secondary | ICD-10-CM | POA: Diagnosis not present

## 2014-10-30 DIAGNOSIS — M10079 Idiopathic gout, unspecified ankle and foot: Secondary | ICD-10-CM | POA: Diagnosis not present

## 2014-10-30 DIAGNOSIS — E1121 Type 2 diabetes mellitus with diabetic nephropathy: Secondary | ICD-10-CM

## 2014-10-30 DIAGNOSIS — I481 Persistent atrial fibrillation: Secondary | ICD-10-CM

## 2014-10-30 DIAGNOSIS — I4819 Other persistent atrial fibrillation: Secondary | ICD-10-CM

## 2014-10-30 LAB — LIPID PANEL
Cholesterol: 132 mg/dL (ref 0–200)
HDL: 33.5 mg/dL — ABNORMAL LOW (ref >39.00)
LDL Cholesterol: 71 mg/dL (ref 0–99)
NONHDL: 98.5
Total CHOL/HDL Ratio: 4
Triglycerides: 137 mg/dL (ref 0.0–149.0)
VLDL: 27.4 mg/dL (ref 0.0–40.0)

## 2014-10-30 LAB — POCT URINALYSIS DIPSTICK
Bilirubin, UA: NEGATIVE
Blood, UA: NEGATIVE
GLUCOSE UA: NEGATIVE
Ketones, UA: NEGATIVE
LEUKOCYTES UA: NEGATIVE
Nitrite, UA: NEGATIVE
Protein, UA: NEGATIVE
Spec Grav, UA: 1.015
Urobilinogen, UA: 0.2
pH, UA: 6

## 2014-10-30 LAB — CBC WITH DIFFERENTIAL/PLATELET
BASOS ABS: 0 10*3/uL (ref 0.0–0.1)
Basophils Relative: 0.5 % (ref 0.0–3.0)
EOS ABS: 1 10*3/uL — AB (ref 0.0–0.7)
EOS PCT: 11.8 % — AB (ref 0.0–5.0)
HCT: 38.5 % — ABNORMAL LOW (ref 39.0–52.0)
Hemoglobin: 12.3 g/dL — ABNORMAL LOW (ref 13.0–17.0)
LYMPHS ABS: 1.4 10*3/uL (ref 0.7–4.0)
Lymphocytes Relative: 16.1 % (ref 12.0–46.0)
MCHC: 32 g/dL (ref 30.0–36.0)
MCV: 85.5 fl (ref 78.0–100.0)
MONO ABS: 0.6 10*3/uL (ref 0.1–1.0)
Monocytes Relative: 6.7 % (ref 3.0–12.0)
NEUTROS PCT: 64.9 % (ref 43.0–77.0)
Neutro Abs: 5.5 10*3/uL (ref 1.4–7.7)
PLATELETS: 146 10*3/uL — AB (ref 150.0–400.0)
RBC: 4.51 Mil/uL (ref 4.22–5.81)
RDW: 17.5 % — AB (ref 11.5–15.5)
WBC: 8.5 10*3/uL (ref 4.0–10.5)

## 2014-10-30 LAB — BASIC METABOLIC PANEL
BUN: 42 mg/dL — ABNORMAL HIGH (ref 6–23)
CALCIUM: 10.3 mg/dL (ref 8.4–10.5)
CO2: 36 meq/L — AB (ref 19–32)
Chloride: 102 mEq/L (ref 96–112)
Creatinine, Ser: 1.63 mg/dL — ABNORMAL HIGH (ref 0.40–1.50)
GFR: 43.15 mL/min — AB (ref >60.00)
Glucose, Bld: 114 mg/dL — ABNORMAL HIGH (ref 70–99)
Potassium: 4.8 mEq/L (ref 3.5–5.1)
Sodium: 142 mEq/L (ref 135–145)

## 2014-10-30 LAB — HEPATIC FUNCTION PANEL
ALT: 22 U/L (ref 0–53)
AST: 22 U/L (ref 0–37)
Albumin: 4.3 g/dL (ref 3.5–5.2)
Alkaline Phosphatase: 136 U/L — ABNORMAL HIGH (ref 39–117)
BILIRUBIN DIRECT: 0.3 mg/dL (ref 0.0–0.3)
Total Bilirubin: 1.2 mg/dL (ref 0.2–1.2)
Total Protein: 8.3 g/dL (ref 6.0–8.3)

## 2014-10-30 LAB — MICROALBUMIN / CREATININE URINE RATIO
CREATININE, U: 20.1 mg/dL
Microalb Creat Ratio: 10.9 mg/g (ref 0.0–30.0)
Microalb, Ur: 2.2 mg/dL — ABNORMAL HIGH (ref 0.0–1.9)

## 2014-10-30 LAB — HEMOGLOBIN A1C: HEMOGLOBIN A1C: 6.5 % (ref 4.6–6.5)

## 2014-10-30 LAB — PROTIME-INR
INR: 2.7 ratio — AB (ref 0.8–1.0)
Prothrombin Time: 28.8 s — ABNORMAL HIGH (ref 9.6–13.1)

## 2014-10-30 LAB — URIC ACID: URIC ACID, SERUM: 11 mg/dL — AB (ref 4.0–7.8)

## 2014-10-30 MED ORDER — WARFARIN SODIUM 5 MG PO TABS: ORAL_TABLET | ORAL | Status: DC

## 2014-10-30 MED ORDER — AZELASTINE HCL 0.15 % NA SOLN: NASAL | Status: AC

## 2014-10-30 MED ORDER — BISOPROLOL FUMARATE 10 MG PO TABS: 10.0000 mg | ORAL_TABLET | Freq: Every day | ORAL | Status: DC

## 2014-10-30 MED ORDER — CALCITRIOL 0.25 MCG PO CAPS: 0.2500 ug | ORAL_CAPSULE | Freq: Every day | ORAL | Status: DC

## 2014-10-30 MED ORDER — ALLOPURINOL 100 MG PO TABS: ORAL_TABLET | ORAL | Status: DC

## 2014-10-30 MED ORDER — LOSARTAN POTASSIUM 25 MG PO TABS: 12.5000 mg | ORAL_TABLET | Freq: Every day | ORAL | Status: DC

## 2014-10-30 MED ORDER — FUROSEMIDE 40 MG PO TABS: ORAL_TABLET | ORAL | Status: DC

## 2014-10-30 NOTE — Assessment & Plan Note (Signed)
Check labs Diet controlled F/u nephrology

## 2014-10-30 NOTE — Patient Instructions (Signed)
Diabetes and Standards of Medical Care Diabetes is complicated. You may find that your diabetes team includes a dietitian, nurse, diabetes educator, eye doctor, and more. To help everyone know what is going on and to help you get the care you deserve, the following schedule of care was developed to help keep you on track. Below are the tests, exams, vaccines, medicines, education, and plans you will need. HbA1c test This test shows how well you have controlled your glucose over the past 2-3 months. It is used to see if your diabetes management plan needs to be adjusted.   It is performed at least 2 times a year if you are meeting treatment goals.  It is performed 4 times a year if therapy has changed or if you are not meeting treatment goals. Blood pressure test  This test is performed at every routine medical visit. The goal is less than 140/90 mm Hg for most people, but 130/80 mm Hg in some cases. Ask your health care provider about your goal. Dental exam  Follow up with the dentist regularly. Eye exam  If you are diagnosed with type 1 diabetes as a child, get an exam upon reaching the age of 37 years or older and have had diabetes for 3-5 years. Yearly eye exams are recommended after that initial eye exam.  If you are diagnosed with type 1 diabetes as an adult, get an exam within 5 years of diagnosis and then yearly.  If you are diagnosed with type 2 diabetes, get an exam as soon as possible after the diagnosis and then yearly. Foot care exam  Visual foot exams are performed at every routine medical visit. The exams check for cuts, injuries, or other problems with the feet.  A comprehensive foot exam should be done yearly. This includes visual inspection as well as assessing foot pulses and testing for loss of sensation.  Check your feet nightly for cuts, injuries, or other problems with your feet. Tell your health care provider if anything is not healing. Kidney function test (urine  microalbumin)  This test is performed once a year.  Type 1 diabetes: The first test is performed 5 years after diagnosis.  Type 2 diabetes: The first test is performed at the time of diagnosis.  A serum creatinine and estimated glomerular filtration rate (eGFR) test is done once a year to assess the level of chronic kidney disease (CKD), if present. Lipid profile (cholesterol, HDL, LDL, triglycerides)  Performed every 5 years for most people.  The goal for LDL is less than 100 mg/dL. If you are at high risk, the goal is less than 70 mg/dL.  The goal for HDL is 40 mg/dL-50 mg/dL for men and 50 mg/dL-60 mg/dL for women. An HDL cholesterol of 60 mg/dL or higher gives some protection against heart disease.  The goal for triglycerides is less than 150 mg/dL. Influenza vaccine, pneumococcal vaccine, and hepatitis B vaccine  The influenza vaccine is recommended yearly.  It is recommended that people with diabetes who are over 24 years old get the pneumonia vaccine. In some cases, two separate shots may be given. Ask your health care provider if your pneumonia vaccination is up to date.  The hepatitis B vaccine is also recommended for adults with diabetes. Diabetes self-management education  Education is recommended at diagnosis and ongoing as needed. Treatment plan  Your treatment plan is reviewed at every medical visit. Document Released: 05/18/2009 Document Revised: 12/05/2013 Document Reviewed: 12/21/2012 [facility] Patient Information 2015 [place]. This information is not intended to replace advice given to you by your health care provider. Make sure you discuss any questions you have with your health care provider.  

## 2014-10-30 NOTE — Telephone Encounter (Signed)
Caller name: Joe Jackson Relation to pt: daughter Call back number: [phone] Pharmacy:  Reason for call:   Joe Jackson could not come with patient for appointment this morning and has questions regarding glucometer meter that was sent home with patient

## 2014-10-30 NOTE — Assessment & Plan Note (Signed)
On warfarin con't cardiology f/u

## 2014-10-30 NOTE — Progress Notes (Signed)
Pre visit review using our clinic review tool, if applicable. No additional management support is needed unless otherwise documented below in the visit note. 

## 2014-10-30 NOTE — Assessment & Plan Note (Signed)
F/u nephrology 

## 2014-10-30 NOTE — Telephone Encounter (Signed)
I spoke with Joe Jackson and she wanted to know why he was given a glucometer and I made her aware the patient is a diabetic, he is just diet controlled. She said he has no idea on how to use it, I made her aware he can stop back by the office and I will be more than happy to show him and she agreed.       KP

## 2014-10-30 NOTE — Assessment & Plan Note (Signed)
Diet controlled. Check labs.  

## 2014-10-30 NOTE — Progress Notes (Signed)
Patient ID: Joe Jackson, male    DOB: 1929-08-08  Age: 81 y.o. MRN: [id number]    Subjective:  Subjective HPI Joe Jackson presents for f/u and labs.  His grand daughter is with him--- pt is requesting to only come here and not see so many specialists----I explained that with all his medical problems it was important to see the specialist until things are at a point they are not needed if that occurs.  Pt understands and will f/u with cards, pulm , nephrology.  No new complaints.    HPI HYPERTENSION  Blood pressure range-not checking   Chest pain- no      Dyspnea- no Lightheadedness- no   Edema- no Other side effects - no   Medication compliance: good Low salt diet- yes  DIABETES  Blood Sugar ranges-not checking  Polyuria- no  New Visual problems- no Hypoglycemic symptoms- no Other side effects-no Medication compliance - diet controlled Last eye exam- last week Foot exam- today  HYPERLIPIDEMIA  Medication compliance- good RUQ pain- no  Muscle aches- no Other side effects-no      Review of Systems  Constitutional: Negative for diaphoresis, appetite change, fatigue and unexpected weight change.  Eyes: Negative for pain, redness and visual disturbance.  Respiratory: Negative for cough, chest tightness, shortness of breath and wheezing.   Cardiovascular: Negative for chest pain, palpitations and leg swelling.  Endocrine: Negative for cold intolerance, heat intolerance, polydipsia, polyphagia and polyuria.  Genitourinary: Negative for dysuria, frequency and difficulty urinating.  Neurological: Negative for dizziness, light-headedness, numbness and headaches.    History Past Medical History  Diagnosis Date  . Hypertension   . Hyperlipidemia   . Chronic combined systolic and diastolic CHF (congestive heart failure)     a. 10/2012 Echo: EF 40-45%, Gr1 dd, postlat AK, mild to mod AS, Triv AI, Mild MR, mildly dil LA.  . Sleep apnea   . Aortic aneurysm, abdominal    f/u by [facility]  . Pulmonary fibrosis   . Ischemic cardiomyopathy     a. EF 40-45% by echo 10/2012  . CAD (coronary artery disease)     a. s/p prior MI;  b. 11/1999 Cath: LM nl, LAD 56m, LCX min irregs, OM 100, RCA 80p, 100d, L->L and L->R collats, EF 54%.  Marland Kitchen COPD (chronic obstructive pulmonary disease)   . PAF (paroxysmal atrial fibrillation)     a. Dx 10/2012 during hosp for resp failure/copd flare  . Aortic stenosis     mild to mod by echo 10/2012 (mean gradient 14 mmHg)  . Shortness of breath   . GERD (gastroesophageal reflux disease)     He has past surgical history that includes Neck surgery; arm surgery; Back surgery; Leg Surgery; and Cardioversion (N/A, 01/20/2013).   His family history includes Heart attack in his father and mother; Heart disease in his mother; Other in his mother.He reports that he quit smoking about 3 years ago. His smoking use included Cigarettes. He has a 62.4 pack-year smoking history. His smokeless tobacco use includes Chew. He reports that he does not drink alcohol or use illicit drugs.  Current Outpatient Prescriptions on File Prior to Visit  Medication Sig Dispense Refill  . budesonide-formoterol (SYMBICORT) 160-4.5 MCG/ACT inhaler Inhale 2 puffs into the lungs 2 (two) times daily. 1 Inhaler 11  . levalbuterol (XOPENEX) 0.63 MG/3ML nebulizer solution Take 3 mLs (0.63 mg total) by nebulization every 6 (six) hours as needed for wheezing or shortness of breath. DX 515 1080 mL 1  .  pravastatin (PRAVACHOL) 80 MG tablet TAKE 1 TABLET EVERY EVENING 90 tablet 3  . tiotropium (SPIRIVA HANDIHALER) 18 MCG inhalation capsule Place 1 capsule (18 mcg total) into inhaler and inhale daily. 30 capsule 0   No current facility-administered medications on file prior to visit.     Objective:  Objective Physical Exam  Constitutional: He is oriented to person, place, and time. Vital signs are normal. He appears well-developed and well-nourished. He is sleeping.  HENT:  Head:  Normocephalic and atraumatic.  Mouth/Throat: Oropharynx is clear and moist.  Eyes: EOM are normal. Pupils are equal, round, and reactive to light.  Neck: Normal range of motion. Neck supple. No thyromegaly present.  Cardiovascular: Normal rate and regular rhythm.   Murmur heard. Pulmonary/Chest: Effort normal and breath sounds normal. No respiratory distress. He has no wheezes. He has no rales. He exhibits no tenderness.  Musculoskeletal: He exhibits edema. He exhibits no tenderness.       Right lower leg: He exhibits edema.       Left lower leg: He exhibits edema.       Legs: Neurological: He is alert and oriented to person, place, and time.  Skin: Skin is warm and dry.  Psychiatric: He has a normal mood and affect. His behavior is normal. Judgment and thought content normal.   BP 118/68 mmHg  Pulse 64  Temp(Src) 97.2 F (36.2 C) (Oral)  Wt 196 lb 12.8 oz (89.268 kg)  SpO2 97% Wt Readings from Last 3 Encounters:  10/30/14 196 lb 12.8 oz (89.268 kg)  06/05/14 194 lb (87.998 kg)  04/12/14 192 lb 6.4 oz (87.272 kg)     Lab Results  Component Value Date   WBC 6.6 12/08/2013   HGB 12.5* 12/08/2013   HCT 39.8 12/08/2013   PLT 177 12/08/2013   GLUCOSE 73 06/05/2014   CHOL 133 12/05/2013   TRIG 141.0 12/05/2013   HDL 33.80* 12/05/2013   LDLDIRECT 140.4 04/14/2012   LDLCALC 71 12/05/2013   ALT 15 12/08/2013   AST 19 12/08/2013   NA 145 06/05/2014   K 4.1 06/05/2014   CL 104 06/05/2014   CREATININE 1.8* 06/05/2014   BUN 29* 06/05/2014   CO2 29 06/05/2014   TSH 0.772 11/12/2012   PSA 1.37 04/14/2012   INR 3.0 06/05/2014   HGBA1C 6.5 04/05/2013   MICROALBUR 4.7* 12/20/2012    Dg Ankle Complete Right  06/05/2014   CLINICAL DATA:  Right ankle swelling.  No known injury.  EXAM: RIGHT ANKLE - COMPLETE 3+ VIEW  COMPARISON:  None currently available  FINDINGS: Remote distal fibula diaphysis and medial malleolus fractures. Intra medullary nail within the fibula, imaged portions  unremarkable. The medial malleolus lag screw penetrates the posterior cortex of the distal tibial metaphysis. No evidence for hardware loosening or fracture. No acute osseous fracture or evidence of osseous infection. No significant tibiotalar joint narrowing. Small plantar and dorsal calcaneus spurs.  IMPRESSION: 1. No acute osseous findings. 2. Remote ankle fracture with hardware, as above.   Electronically Signed   By: Jorje Guild M.D.   On: 06/05/2014 16:16     Assessment & Plan:  Plan I have discontinued Joe Jackson's guaiFENesin, diphenhydrAMINE, famotidine, HYDROcodone-acetaminophen, and hydrocortisone valerate cream. I have also changed his bisoprolol, furosemide, calcitRIOL, and losartan. Additionally, I am having him start on Azelastine HCl. Lastly, I am having him maintain his budesonide-formoterol, levalbuterol, tiotropium, pravastatin, warfarin, and allopurinol.  Meds ordered this encounter  Medications  . bisoprolol (ZEBETA) 10 MG  tablet    Sig: Take 1 tablet (10 mg total) by mouth daily.    Dispense:  90 tablet    Refill:  3  . furosemide (LASIX) 40 MG tablet    Sig: 2 po qam and 1 po qpm    Dispense:  240 tablet    Refill:  3  . calcitRIOL (ROCALTROL) 0.25 MCG capsule    Sig: Take 1 capsule (0.25 mcg total) by mouth daily.    Dispense:  90 capsule    Refill:  3  . losartan (COZAAR) 25 MG tablet    Sig: Take 0.5 tablets (12.5 mg total) by mouth daily.    Dispense:  135 tablet    Refill:  1  . warfarin (COUMADIN) 5 MG tablet    Sig: Take as directed.    Dispense:  30 tablet    Refill:  3  . allopurinol (ZYLOPRIM) 100 MG tablet    Sig: TAKE 1 TABLET (100 MG TOTAL) BY MOUTH DAILY.    Dispense:  30 tablet    Refill:  2  . Azelastine HCl 0.15 % SOLN    Sig: 1 spray each nostril bid    Dispense:  30 mL    Refill:  5    Problem List Items Addressed This Visit    PAF (paroxysmal atrial fibrillation)   Relevant Medications   bisoprolol (ZEBETA) tablet   furosemide  (LASIX) tablet   losartan (COZAAR) tablet   warfarin (COUMADIN) tablet   Other Relevant Orders   Hepatic function panel   Lipid panel   POCT urinalysis dipstick   INR/PT   Persistent atrial fibrillation (Chronic)    On warfarin con't cardiology f/u      Relevant Medications   bisoprolol (ZEBETA) tablet   furosemide (LASIX) tablet   losartan (COZAAR) tablet   warfarin (COUMADIN) tablet   Diabetes mellitus with renal manifestation    Check labs Diet controlled F/u nephrology      Relevant Medications   losartan (COZAAR) tablet   Diabetes mellitus with renal manifestations, uncontrolled    Diet controlled Check labs      Relevant Medications   losartan (COZAAR) tablet   Other Relevant Orders   Hemoglobin A1c   Microalbumin / creatinine urine ratio   Chronic renal failure    F/u nephrology       Other Visit Diagnoses    Essential hypertension    -  Primary    Relevant Medications    bisoprolol (ZEBETA) tablet    furosemide (LASIX) tablet    losartan (COZAAR) tablet    warfarin (COUMADIN) tablet    Other Relevant Orders    Basic metabolic panel    CBC with Differential/Platelet    Hepatic function panel    Lipid panel    POCT urinalysis dipstick    CHF NYHA class II, chronic, systolic        Relevant Medications    bisoprolol (ZEBETA) tablet    furosemide (LASIX) tablet    losartan (COZAAR) tablet    warfarin (COUMADIN) tablet    Other Relevant Orders    Hepatic function panel    Lipid panel    POCT urinalysis dipstick    Acute idiopathic gout of foot, unspecified laterality        Relevant Medications    allopurinol (ZYLOPRIM) tablet    Other Relevant Orders    Hepatic function panel    Lipid panel    POCT urinalysis dipstick    Uric acid  Follow-up: Return in about 3 months (around 01/30/2015), or if symptoms worsen or fail to improve, for f/u and labs--- 1 month for pt .  Garnet Koyanagi, DO

## 2014-10-31 ENCOUNTER — Other Ambulatory Visit (INDEPENDENT_AMBULATORY_CARE_PROVIDER_SITE_OTHER): Payer: Medicare FFS

## 2014-10-31 LAB — VITAMIN D 25 HYDROXY (VIT D DEFICIENCY, FRACTURES): VITD: 26.36 ng/mL — AB (ref 30.00–100.00)

## 2014-11-03 ENCOUNTER — Other Ambulatory Visit: Payer: Self-pay

## 2014-11-03 MED ORDER — ERGOCALCIFEROL 1.25 MG (50000 UT) PO CAPS: 50000.0000 [IU] | ORAL_CAPSULE | ORAL | Status: DC

## 2014-11-30 ENCOUNTER — Ambulatory Visit: Payer: Medicare FFS

## 2014-12-12 ENCOUNTER — Ambulatory Visit (HOSPITAL_BASED_OUTPATIENT_CLINIC_OR_DEPARTMENT_OTHER)
Admission: RE | Admit: 2014-12-12 | Discharge: 2014-12-12 | Disposition: A | Discharge status: Home / Self Care | Payer: Medicare FFS | Source: Ambulatory Visit | Attending: Cardiology | Admitting: Cardiology

## 2014-12-12 ENCOUNTER — Ambulatory Visit (HOSPITAL_COMMUNITY)
Admission: RE | Admit: 2014-12-12 | Discharge: 2014-12-12 | Disposition: A | Discharge status: Home / Self Care | Payer: Medicare FFS | Source: Ambulatory Visit | Attending: Cardiology | Admitting: Cardiology

## 2014-12-12 VITALS — BP 116/70 | HR 70 | Resp 18 | Wt 197.2 lb

## 2014-12-12 DIAGNOSIS — I5020 Unspecified systolic (congestive) heart failure: Secondary | ICD-10-CM

## 2014-12-12 DIAGNOSIS — I5022 Chronic systolic (congestive) heart failure: Secondary | ICD-10-CM | POA: Diagnosis not present

## 2014-12-12 DIAGNOSIS — I739 Peripheral vascular disease, unspecified: Secondary | ICD-10-CM | POA: Diagnosis not present

## 2014-12-12 DIAGNOSIS — I5042 Chronic combined systolic (congestive) and diastolic (congestive) heart failure: Secondary | ICD-10-CM | POA: Diagnosis present

## 2014-12-12 DIAGNOSIS — I10 Essential (primary) hypertension: Secondary | ICD-10-CM | POA: Diagnosis not present

## 2014-12-12 DIAGNOSIS — I2510 Atherosclerotic heart disease of native coronary artery without angina pectoris: Secondary | ICD-10-CM

## 2014-12-12 DIAGNOSIS — I350 Nonrheumatic aortic (valve) stenosis: Secondary | ICD-10-CM | POA: Insufficient documentation

## 2014-12-12 DIAGNOSIS — N183 Chronic kidney disease, stage 3 unspecified: Secondary | ICD-10-CM

## 2014-12-12 DIAGNOSIS — I4819 Other persistent atrial fibrillation: Secondary | ICD-10-CM

## 2014-12-12 DIAGNOSIS — I481 Persistent atrial fibrillation: Secondary | ICD-10-CM

## 2014-12-12 MED ORDER — PERFLUTREN LIPID MICROSPHERE
1.0000 mL | Freq: Once | INTRAVENOUS | Status: AC
  Administered 2014-12-12: 2 mL via INTRAVENOUS

## 2014-12-12 MED ORDER — BISOPROLOL FUMARATE 10 MG PO TABS: 10.0000 mg | ORAL_TABLET | Freq: Every day | ORAL | Status: DC

## 2014-12-12 MED ORDER — LOSARTAN POTASSIUM 25 MG PO TABS: 12.5000 mg | ORAL_TABLET | Freq: Every day | ORAL | Status: DC

## 2014-12-12 MED ORDER — FUROSEMIDE 40 MG PO TABS: ORAL_TABLET | ORAL | Status: DC

## 2014-12-12 NOTE — Progress Notes (Signed)
Patient in clinic today for routine appointment and echocardiogram.  Patient required definity contrast for echocardiogram clarity.  PIV 22g started x 1 attempt in LAC.  2cc definitey pushed per echo tech directions.  Patient tolerated well.  PIV removed after study complete and clean dry gauze bandage applied to site.  Renee Pain

## 2014-12-12 NOTE — Patient Instructions (Signed)
Will schedule you for lower extremity arterial dopplers at [facility]. Address: [address], [place], [place] [address]  [phone]  Follow up 4 months.  Do the following things EVERYDAY: 1) Weigh yourself in the morning before breakfast. Write it down and keep it in a log. 2) Take your medicines as prescribed 3) Eat low salt foods-Limit salt (sodium) to 2000 mg per day.  4) Stay as active as you can everyday 5) Limit all fluids for the day to less than 2 liters
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
## 2014-12-20 ENCOUNTER — Encounter (HOSPITAL_COMMUNITY): Payer: Medicare FFS

## 2015-02-02 ENCOUNTER — Encounter: Payer: Self-pay | Admitting: Family Medicine

## 2015-02-02 ENCOUNTER — Ambulatory Visit (INDEPENDENT_AMBULATORY_CARE_PROVIDER_SITE_OTHER): Payer: Medicare FFS | Admitting: Family Medicine

## 2015-02-02 ENCOUNTER — Ambulatory Visit (HOSPITAL_BASED_OUTPATIENT_CLINIC_OR_DEPARTMENT_OTHER)
Admission: RE | Admit: 2015-02-02 | Discharge: 2015-02-02 | Disposition: A | Discharge status: Home / Self Care | Payer: Medicare FFS | Source: Ambulatory Visit | Attending: Family Medicine | Admitting: Family Medicine

## 2015-02-02 VITALS — BP 112/64 | HR 72 | Temp 98.1°F | Ht 66.0 in | Wt 188.4 lb

## 2015-02-02 DIAGNOSIS — M170 Bilateral primary osteoarthritis of knee: Secondary | ICD-10-CM

## 2015-02-02 DIAGNOSIS — R918 Other nonspecific abnormal finding of lung field: Secondary | ICD-10-CM | POA: Insufficient documentation

## 2015-02-02 DIAGNOSIS — J441 Chronic obstructive pulmonary disease with (acute) exacerbation: Secondary | ICD-10-CM | POA: Diagnosis not present

## 2015-02-02 DIAGNOSIS — J189 Pneumonia, unspecified organism: Secondary | ICD-10-CM | POA: Diagnosis not present

## 2015-02-02 DIAGNOSIS — J449 Chronic obstructive pulmonary disease, unspecified: Secondary | ICD-10-CM | POA: Insufficient documentation

## 2015-02-02 DIAGNOSIS — R05 Cough: Secondary | ICD-10-CM | POA: Diagnosis present

## 2015-02-02 MED ORDER — IPRATROPIUM-ALBUTEROL 0.5-2.5 (3) MG/3ML IN SOLN
3.0000 mL | Freq: Once | RESPIRATORY_TRACT | Status: AC
  Administered 2015-02-02: 3 mL via RESPIRATORY_TRACT

## 2015-02-02 MED ORDER — IPRATROPIUM-ALBUTEROL 0.5-2.5 (3) MG/3ML IN SOLN: 3.0000 mL | Freq: Four times a day (QID) | RESPIRATORY_TRACT | Status: AC | PRN

## 2015-02-02 MED ORDER — LEVOFLOXACIN 500 MG PO TABS: 500.0000 mg | ORAL_TABLET | Freq: Every day | ORAL | Status: DC

## 2015-02-02 MED ORDER — PREDNISONE 10 MG PO TABS: ORAL_TABLET | ORAL | Status: DC

## 2015-02-02 MED ORDER — METHYLPREDNISOLONE ACETATE 80 MG/ML IJ SUSP
80.0000 mg | Freq: Once | INTRAMUSCULAR | Status: AC
  Administered 2015-02-02: 80 mg via INTRAMUSCULAR

## 2015-02-02 MED ORDER — NONFORMULARY OR COMPOUNDED ITEM: Status: AC

## 2015-02-02 NOTE — Progress Notes (Signed)
Pre visit review using our clinic review tool, if applicable. No additional management support is needed unless otherwise documented below in the visit note. 

## 2015-02-02 NOTE — Patient Instructions (Signed)

## 2015-02-02 NOTE — Progress Notes (Signed)
Patient ID: Joe Jackson, male    DOB: [birth year]  Age: 81 y.o. MRN: [id number]    Subjective:  Subjective HPI MIRO BALDERSON presents c/o cough and congestion x several days.  His daughter is present and is requesting a powerwheelchair for him to get around the property.    Review of Systems  Constitutional: Negative for fever and chills.  HENT: Positive for congestion. Negative for postnasal drip, rhinorrhea and sinus pressure.   Respiratory: Positive for cough, chest tightness, shortness of breath and wheezing.   Cardiovascular: Negative for chest pain, palpitations and leg swelling.  Allergic/Immunologic: Negative for environmental allergies.  Psychiatric/Behavioral: Negative for dysphoric mood and decreased concentration. The patient is not nervous/anxious and is not hyperactive.     History Past Medical History  Diagnosis Date  . Hypertension   . Hyperlipidemia   . Chronic combined systolic and diastolic CHF (congestive heart failure)     a. 10/2012 Echo: EF 40-45%, Gr1 dd, postlat AK, mild to mod AS, Triv AI, Mild MR, mildly dil LA.  . Sleep apnea   . Aortic aneurysm, abdominal     f/u by [facility]  . Pulmonary fibrosis   . Ischemic cardiomyopathy     a. EF 40-45% by echo 10/2012  . CAD (coronary artery disease)     a. s/p prior MI;  b. 11/1999 Cath: LM nl, LAD 43m, LCX min irregs, OM 100, RCA 80p, 100d, L->L and L->R collats, EF 54%.  Marland Kitchen COPD (chronic obstructive pulmonary disease)   . PAF (paroxysmal atrial fibrillation)     a. Dx 10/2012 during hosp for resp failure/copd flare  . Aortic stenosis     mild to mod by echo 10/2012 (mean gradient 14 mmHg)  . Shortness of breath   . GERD (gastroesophageal reflux disease)     He has past surgical history that includes Neck surgery; arm surgery; Back surgery; Leg Surgery; and Cardioversion (N/A, 01/20/2013).   His family history includes Heart attack in his father and mother; Heart disease in his mother; Other in his mother.He reports  that he quit smoking about 3 years ago. His smoking use included Cigarettes. He has a 62.4 pack-year smoking history. His smokeless tobacco use includes Chew. He reports that he does not drink alcohol or use illicit drugs.  Current Outpatient Prescriptions on File Prior to Visit  Medication Sig Dispense Refill  . allopurinol (ZYLOPRIM) 100 MG tablet TAKE 1 TABLET (100 MG TOTAL) BY MOUTH DAILY. 30 tablet 2  . Azelastine HCl 0.15 % SOLN 1 spray each nostril bid 30 mL 5  . bisoprolol (ZEBETA) 10 MG tablet Take 1 tablet (10 mg total) by mouth daily. 90 tablet 3  . budesonide-formoterol (SYMBICORT) 160-4.5 MCG/ACT inhaler Inhale 2 puffs into the lungs 2 (two) times daily. 1 Inhaler 11  . calcitRIOL (ROCALTROL) 0.25 MCG capsule Take 1 capsule (0.25 mcg total) by mouth daily. 90 capsule 3  . ergocalciferol (VITAMIN D2) 50000 UNITS capsule Take 1 capsule (50,000 Units total) by mouth once a week. 4 capsule 3  . furosemide (LASIX) 40 MG tablet 2 po qam and 1 po qpm 240 tablet 3  . losartan (COZAAR) 25 MG tablet Take 0.5 tablets (12.5 mg total) by mouth daily. 135 tablet 1  . pravastatin (PRAVACHOL) 80 MG tablet TAKE 1 TABLET EVERY EVENING 90 tablet 3  . tiotropium (SPIRIVA HANDIHALER) 18 MCG inhalation capsule Place 1 capsule (18 mcg total) into inhaler and inhale daily. 30 capsule 0  . warfarin (COUMADIN)  5 MG tablet Take as directed. 30 tablet 3   No current facility-administered medications on file prior to visit.     Objective:  Objective Physical Exam  Constitutional: He appears well-developed and well-nourished. No distress.  HENT:  Head: Normocephalic and atraumatic.  Right Ear: Tympanic membrane normal.  Left Ear: Tympanic membrane normal.  Nose: No mucosal edema or rhinorrhea. Right sinus exhibits no maxillary sinus tenderness and no frontal sinus tenderness. Left sinus exhibits no maxillary sinus tenderness and no frontal sinus tenderness.  Mouth/Throat: Mucous membranes are normal. No  oropharyngeal exudate, posterior oropharyngeal edema or posterior oropharyngeal erythema.  Eyes: Conjunctivae and EOM are normal. Pupils are equal, round, and reactive to light.  Neck: Normal range of motion. Neck supple.  Cardiovascular: Normal rate, regular rhythm and normal heart sounds.   Pulmonary/Chest: Effort normal. No respiratory distress. He has wheezes. He has rales.  + hacking cough  Lymphadenopathy:    He has no cervical adenopathy.  Skin: Skin is warm and dry.  Psychiatric: He has a normal mood and affect. His behavior is normal.   BP 112/64 mmHg  Pulse 72  Temp(Src) 98.1 F (36.7 C) (Oral)  Ht 5\' 6"  (1.676 m)  Wt 188 lb 6.4 oz (85.458 kg)  BMI 30.42 kg/m2  SpO2 92% Wt Readings from Last 3 Encounters:  02/02/15 188 lb 6.4 oz (85.458 kg)  12/12/14 197 lb 4 oz (89.472 kg)  10/30/14 196 lb 12.8 oz (89.268 kg)     Lab Results  Component Value Date   WBC 8.5 10/30/2014   HGB 12.3* 10/30/2014   HCT 38.5* 10/30/2014   PLT 146.0* 10/30/2014   GLUCOSE 114* 10/30/2014   CHOL 132 10/30/2014   TRIG 137.0 10/30/2014   HDL 33.50* 10/30/2014   LDLDIRECT 140.4 04/14/2012   LDLCALC 71 10/30/2014   ALT 22 10/30/2014   AST 22 10/30/2014   NA 142 10/30/2014   K 4.8 10/30/2014   CL 102 10/30/2014   CREATININE 1.63* 10/30/2014   BUN 42* 10/30/2014   CO2 36* 10/30/2014   TSH 0.772 11/12/2012   PSA 1.37 04/14/2012   INR 2.7* 10/30/2014   HGBA1C 6.5 10/30/2014   MICROALBUR 2.2* 10/30/2014    No results found.   Assessment & Plan:  Plan I have discontinued Mr. Glasco's levalbuterol. I am also having him start on ipratropium-albuterol, predniSONE, levofloxacin, and NONFORMULARY OR COMPOUNDED ITEM. Additionally, I am having him maintain his budesonide-formoterol, tiotropium, pravastatin, calcitRIOL, warfarin, allopurinol, Azelastine HCl, ergocalciferol, bisoprolol, furosemide, and losartan.  Meds ordered this encounter  Medications  . ipratropium-albuterol (DUONEB)  0.5-2.5 (3) MG/3ML SOLN    Sig: Take 3 mLs by nebulization every 6 (six) hours as needed.    Dispense:  360 mL    Refill:  5  . predniSONE (DELTASONE) 10 MG tablet    Sig: 3 po qd for 3 days then 2 po qd for 3 days the 1 po qd for 3 days    Dispense:  18 tablet    Refill:  0  . levofloxacin (LEVAQUIN) 500 MG tablet    Sig: Take 1 tablet (500 mg total) by mouth daily.    Dispense:  10 tablet    Refill:  0  . NONFORMULARY OR COMPOUNDED ITEM    Sig: Power wheelchair #1  Dx - osteoarthritis, copd, atrial fib, chf    Dispense:  1 each    Refill:  0    Problem List Items Addressed This Visit    CAP (community  acquired pneumonia) - Primary   Relevant Medications   ipratropium-albuterol (DUONEB) 0.5-2.5 (3) MG/3ML SOLN   levofloxacin (LEVAQUIN) 500 MG tablet   Other Relevant Orders   DG Chest 2 View    Other Visit Diagnoses    COPD exacerbation        Relevant Medications    ipratropium-albuterol (DUONEB) 0.5-2.5 (3) MG/3ML SOLN    predniSONE (DELTASONE) 10 MG tablet    Other Relevant Orders    DG Chest 2 View    Primary osteoarthritis of both knees           Follow-up: Return in about 2 weeks (around 02/16/2015), or if symptoms worsen or fail to improve, for f/u pneumonia.  Garnet Koyanagi, DO

## 2015-02-02 NOTE — Addendum Note (Signed)
Addended by: Ewing Schlein on: 02/02/2015 11:32 AM   Modules accepted: Orders

## 2015-02-13 ENCOUNTER — Telehealth: Payer: Self-pay | Admitting: Family Medicine

## 2015-02-13 NOTE — Telephone Encounter (Signed)
He really needs to see pulmonary--- if they can t get him in soon he need s to come here

## 2015-02-13 NOTE — Telephone Encounter (Signed)
Please Triage      KP

## 2015-02-13 NOTE — Telephone Encounter (Signed)
Patient states he is feeling a little better since last visit, but still has a productive cough at times.  He states he is coughing less, but still brings up sputum sometimes.  He feels very hoarse.  He has no shortness of breath, no fevers.  He has been taking Mucinex and has completed his antibiotic.  He is requesting another antibiotic because he does not feel like the illness is completely resolved.    Please advise.

## 2015-02-13 NOTE — Telephone Encounter (Signed)
Please notify pts daughter if med is called in. She said pt has some alzheimers. Does pt need to come in or just rx sent in?   Caller name: Ermine, Spofford Relation to pt: daughter  Call back number: [phone]

## 2015-02-13 NOTE — Telephone Encounter (Signed)
Caller name: Durrel, Mcnee Relation to pt: daughter  Call back number: [phone] Pharmacy: [id number] - [place], [place] - [address] AT [place] [phone] (Phone) [phone] (Fax)         Reason for call:  As per daughter patient is not feeling better from last office visit 02/02/2015 patient is still, hoarse and congested.daughter states antibiotics did not work. Please advise

## 2015-02-14 ENCOUNTER — Ambulatory Visit (INDEPENDENT_AMBULATORY_CARE_PROVIDER_SITE_OTHER): Payer: Medicare FFS | Admitting: Internal Medicine

## 2015-02-14 ENCOUNTER — Encounter: Payer: Self-pay | Admitting: Internal Medicine

## 2015-02-14 VITALS — BP 124/78 | HR 96 | Temp 97.9°F | Ht 66.0 in | Wt 189.0 lb

## 2015-02-14 DIAGNOSIS — Z7901 Long term (current) use of anticoagulants: Secondary | ICD-10-CM | POA: Diagnosis not present

## 2015-02-14 DIAGNOSIS — R739 Hyperglycemia, unspecified: Secondary | ICD-10-CM | POA: Diagnosis not present

## 2015-02-14 DIAGNOSIS — J441 Chronic obstructive pulmonary disease with (acute) exacerbation: Secondary | ICD-10-CM

## 2015-02-14 LAB — POCT INR: INR: 2.7

## 2015-02-14 LAB — GLUCOSE, POCT (MANUAL RESULT ENTRY): POC GLUCOSE: 168 mg/dL — AB (ref 70–99)

## 2015-02-14 MED ORDER — BUDESONIDE-FORMOTEROL FUMARATE 160-4.5 MCG/ACT IN AERO: 2.0000 | INHALATION_SPRAY | Freq: Two times a day (BID) | RESPIRATORY_TRACT | Status: AC

## 2015-02-14 MED ORDER — PREDNISONE 10 MG PO TABS: ORAL_TABLET | ORAL | Status: DC

## 2015-02-14 NOTE — Telephone Encounter (Signed)
The patient has an apt scheduled for today with Dr.Paz at 1 pm.      KP

## 2015-02-14 NOTE — Telephone Encounter (Signed)
Called and spoke with the pt's daughter on (02/13/15) and informed her of the note below.  The daughter stated that the pt has not seen pulmonary.  He was seen here and dx with pneumonia, and she feels he needs to be followed up here.  Pt was scheduled for follow-up on Thurs with Dr. Rolla Flatten

## 2015-02-14 NOTE — Patient Instructions (Addendum)
Symbicort twice a day Spiriva daily Mucinex DM twice a day until better Nebulizations every 6 hours as needed Prednisone as prescribed for 5 days  Call if your breathing is not back to normal in the next 10 days Call if symptoms increase, fever, chills, increased difficulty breathing or swelling  Continue with the same Coumadin dose Schedule appointment to check your Coumadin in one month.  Diabetes: Check your blood sugar  once a day   Check your blood sugar  at different times of the day  GOALS: Fasting before a meal 70- 130 2 hours after a meal less than 180 At bedtime 90-150 Call if consistently not at goal

## 2015-02-14 NOTE — Progress Notes (Signed)
Subjective:    Patient ID: Joe Jackson, male    DOB: 1930-01-21, 81 y.o.   MRN: [id number]  DOS:  02/14/2015 Type of visit - description : Acute visit Interval history:  Was seen 02/02/2015 with respiratory symptoms, a chest x-ray show a question of pneumonitis but no consolidation, he was diagnosed with possible pneumonia and COPD exacerbation. He finished Levaquin and steroids. Overall he feels better but not back to baseline, still coughing, no sputum production, some hoarseness.   Review of Systems No fever chills No chest pain Lower extremity edema at baseline. No hemoptysis  Past Medical History  Diagnosis Date  . Hypertension   . Hyperlipidemia   . Chronic combined systolic and diastolic CHF (congestive heart failure)     a. 10/2012 Echo: EF 40-45%, Gr1 dd, postlat AK, mild to mod AS, Triv AI, Mild MR, mildly dil LA.  . Sleep apnea   . Aortic aneurysm, abdominal     f/u by [facility]  . Pulmonary fibrosis   . Ischemic cardiomyopathy     a. EF 40-45% by echo 10/2012  . CAD (coronary artery disease)     a. s/p prior MI;  b. 11/1999 Cath: LM nl, LAD 58m, LCX min irregs, OM 100, RCA 80p, 100d, L->L and L->R collats, EF 54%.  Marland Kitchen COPD (chronic obstructive pulmonary disease)   . PAF (paroxysmal atrial fibrillation)     a. Dx 10/2012 during hosp for resp failure/copd flare  . Aortic stenosis     mild to mod by echo 10/2012 (mean gradient 14 mmHg)  . Shortness of breath   . GERD (gastroesophageal reflux disease)     Past Surgical History  Procedure Laterality Date  . Neck surgery    . Arm surgery    . Back surgery    . Leg surgery    . Cardioversion N/A 01/20/2013    Procedure: CARDIOVERSION;  Surgeon: Darlin Coco, MD;  Location: [facility] ENDOSCOPY;  Service: Cardiovascular;  Laterality: N/A;    History   Social History  . Marital Status: Legally Separated    Spouse Name: N/A  . Number of Children: N/A  . Years of Education: N/A   Occupational History  . Not on file.    Social History Main Topics  . Smoking status: Former Smoker -- 0.80 packs/day for 78 years    Types: Cigarettes    Quit date: 08/05/2011  . Smokeless tobacco: Current User    Types: Chew    Last Attempt to Quit: 08/11/2011  . Alcohol Use: No  . Drug Use: No  . Sexual Activity: Not on file   Other Topics Concern  . Not on file   Social History Narrative   Lives in [place] by himself. Retired but active around the house/yard.        Medication List       This list is accurate as of: 02/14/15  5:28 PM.  Always use your most recent med list.               allopurinol 100 MG tablet  Commonly known as:  ZYLOPRIM  TAKE 1 TABLET (100 MG TOTAL) BY MOUTH DAILY.     Azelastine HCl 0.15 % Soln  1 spray each nostril bid     bisoprolol 10 MG tablet  Commonly known as:  ZEBETA  Take 1 tablet (10 mg total) by mouth daily.     budesonide-formoterol 160-4.5 MCG/ACT inhaler  Commonly known as:  SYMBICORT  Inhale 2 puffs  into the lungs 2 (two) times daily.     calcitRIOL 0.25 MCG capsule  Commonly known as:  ROCALTROL  Take 1 capsule (0.25 mcg total) by mouth daily.     ergocalciferol 50000 UNITS capsule  Commonly known as:  VITAMIN D2  Take 1 capsule (50,000 Units total) by mouth once a week.     furosemide 40 MG tablet  Commonly known as:  LASIX  2 po qam and 1 po qpm     ipratropium-albuterol 0.5-2.5 (3) MG/3ML Soln  Commonly known as:  DUONEB  Take 3 mLs by nebulization every 6 (six) hours as needed.     losartan 25 MG tablet  Commonly known as:  COZAAR  Take 0.5 tablets (12.5 mg total) by mouth daily.     NONFORMULARY OR COMPOUNDED ITEM  Power wheelchair #1  Dx - osteoarthritis, copd, atrial fib, chf     pravastatin 80 MG tablet  Commonly known as:  PRAVACHOL  TAKE 1 TABLET EVERY EVENING     predniSONE 10 MG tablet  Commonly known as:  DELTASONE  2 tablets a day for 5 days     tiotropium 18 MCG inhalation capsule  Commonly known as:  SPIRIVA HANDIHALER   Place 1 capsule (18 mcg total) into inhaler and inhale daily.     warfarin 5 MG tablet  Commonly known as:  COUMADIN  Take as directed.           Objective:   Physical Exam BP 124/78 mmHg  Pulse 96  Temp(Src) 97.9 F (36.6 C) (Oral)  Ht 5\' 6"  (1.676 m)  Wt 189 lb (85.73 kg)  BMI 30.52 kg/m2  SpO2 96%  General:   Well developed, well nourished . NAD. Vital signs stable HEENT:  Normocephalic . Face symmetric, atraumatic Lungs:  Decreased breath sounds, very few rhonchi bilaterally, no wheezing, prolonged expiratory time Normal respiratory effort, no intercostal retractions, no accessory muscle use. Heart: Regular? + Soft systolic murmur.  Trace pretibial edema bilaterally  Skin: Not pale. Not jaundice Neurologic:   alert & oriented X3.  Speech normal, gait appropriate for age and unassisted Psych--  Cognition and judgment appear intact.  Cooperative with normal attention span and concentration.  Behavior appropriate. No anxious or depressed appearing.       Assessment & Plan:    COPD exacerbation Symptoms improve, status post Levaquin and steroid spirit Plan: Continue with inhalers, nebulizations, will provide a second round of steroid, low dose. See  instructions  Coumadin management, last INR was March, the patient is resistant to check frequently his inr, "maybe once a month". Risk of Coumadin under and over treatment discussed. INR 2.7 today, recheck in one month, continue same dose of Coumadin  Mild diabetes, last A1c discussed with the patient. CBG today 168, he has a glucometer, recommend to check CBGs daily.   Today , I spent more than  25  min with the patient: >50% of the time counseling regards importance of Coumadin checks, discussing plan of care, patient and daughter had multiple questions, discussing CBG goals

## 2015-02-14 NOTE — Progress Notes (Signed)
Pre visit review using our clinic review tool, if applicable. No additional management support is needed unless otherwise documented below in the visit note. 

## 2015-02-15 ENCOUNTER — Other Ambulatory Visit: Payer: Self-pay | Admitting: Family Medicine

## 2015-03-26 ENCOUNTER — Telehealth: Payer: Self-pay | Admitting: Family Medicine

## 2015-03-26 NOTE — Telephone Encounter (Signed)
Caller name: Tye [place]  Relation to pt: from [place]  Call back number: [phone] option # 1 fax # [phone]    Reason for call:  [place] requesting last office notes and demo sheets/insurance information due to [facility] never servicing this patient.

## 2015-03-27 NOTE — Telephone Encounter (Signed)
All requested information sent.     KP

## 2015-04-02 ENCOUNTER — Ambulatory Visit: Payer: Medicare FFS | Admitting: Family Medicine

## 2015-04-03 ENCOUNTER — Ambulatory Visit: Payer: Medicare FFS | Admitting: Family Medicine

## 2015-05-07 ENCOUNTER — Other Ambulatory Visit: Payer: Self-pay

## 2015-05-07 MED ORDER — ALLOPURINOL 100 MG PO TABS: ORAL_TABLET | ORAL | Status: AC

## 2015-05-31 ENCOUNTER — Other Ambulatory Visit: Payer: Self-pay | Admitting: Family Medicine

## 2015-11-04 ENCOUNTER — Other Ambulatory Visit: Payer: Self-pay | Admitting: Internal Medicine

## 2015-11-04 ENCOUNTER — Other Ambulatory Visit: Payer: Self-pay | Admitting: Family Medicine

## 2016-01-04 ENCOUNTER — Encounter: Payer: Self-pay | Admitting: *Deleted

## 2016-01-04 ENCOUNTER — Emergency Department
Admission: EM | Admit: 2016-01-04 | Discharge: 2016-01-04 | Disposition: A | Discharge status: Home / Self Care | Payer: Medicare FFS | Source: Home / Self Care | Attending: Family Medicine | Admitting: Family Medicine

## 2016-01-04 ENCOUNTER — Emergency Department (INDEPENDENT_AMBULATORY_CARE_PROVIDER_SITE_OTHER): Payer: Medicare FFS

## 2016-01-04 DIAGNOSIS — S92401B Displaced unspecified fracture of right great toe, initial encounter for open fracture: Secondary | ICD-10-CM

## 2016-01-04 DIAGNOSIS — S99921A Unspecified injury of right foot, initial encounter: Secondary | ICD-10-CM

## 2016-01-04 DIAGNOSIS — S97111A Crushing injury of right great toe, initial encounter: Secondary | ICD-10-CM | POA: Diagnosis not present

## 2016-01-04 DIAGNOSIS — W208XXA Other cause of strike by thrown, projected or falling object, initial encounter: Secondary | ICD-10-CM | POA: Diagnosis not present

## 2016-01-04 MED ORDER — AMOXICILLIN 500 MG PO CAPS: 500.0000 mg | ORAL_CAPSULE | Freq: Two times a day (BID) | ORAL | Status: DC

## 2016-01-04 NOTE — Discharge Instructions (Signed)
Please take antibiotics as prescribed and be sure to complete entire course even if you start to feel better to ensure infection does not come back.  If you develop lips swelling, rash, difficulty breathing, or any other signs of allergic reaction, please stop taking antibiotic immediately and call urgent care, or go to closest emergency department.   To help with swelling, please keep foot elevated as much as possible.  This will help decrease swelling, pain, and bleeding.  You should change the bandage at least 2-3 times a day, sooner if it becomes wet or dirty.  Keep your wound clean with soap and water, pat dry wound with clean towel. You may apply over the counter antibiotic ointment before re bandaging .

## 2016-01-04 NOTE — ED Provider Notes (Signed)
CSN: [id number]     Arrival date & time 01/04/16  1548 History   First MD Initiated Contact with Patient 01/04/16 1552     Chief Complaint  Patient presents with  . Laceration   (Consider location/radiation/quality/duration/timing/severity/associated sxs/prior Treatment) HPI  The pt is an 82yo male brought to [facility] by his son-in-law for evaluation of a Right great toe injury that occurred around 8:30AM this morning when a propane tank rolled out of his car and fell onto his toe.  Pt is on coumadin. Denies any other injuries. Bleeding controlled PTA, occasionally still oozing blood. Pain was aching and sore this morning but minimal at this time. No pain medication taken PTA.  No hx of DM.  Past Medical History  Diagnosis Date  . Hypertension   . Hyperlipidemia   . Chronic combined systolic and diastolic CHF (congestive heart failure) ([place])     a. 10/2012 Echo: EF 40-45%, Gr1 dd, postlat AK, mild to mod AS, Triv AI, Mild MR, mildly dil LA.  . Sleep apnea   . Aortic aneurysm, abdominal ([place])     f/u by [facility]  . Pulmonary fibrosis ([place])   . Ischemic cardiomyopathy     a. EF 40-45% by echo 10/2012  . CAD (coronary artery disease)     a. s/p prior MI;  b. 11/1999 Cath: LM nl, LAD 84m, LCX min irregs, OM 100, RCA 80p, 100d, L->L and L->R collats, EF 54%.  Marland Kitchen COPD (chronic obstructive pulmonary disease) ([place])   . PAF (paroxysmal atrial fibrillation) ([place])     a. Dx 10/2012 during hosp for resp failure/copd flare  . Aortic stenosis     mild to mod by echo 10/2012 (mean gradient 14 mmHg)  . Shortness of breath   . GERD (gastroesophageal reflux disease)    Past Surgical History  Procedure Laterality Date  . Neck surgery    . Arm surgery    . Back surgery    . Leg surgery    . Cardioversion N/A 01/20/2013    Procedure: CARDIOVERSION;  Surgeon: Darlin Coco, MD;  Location: [facility] ENDOSCOPY;  Service: Cardiovascular;  Laterality: N/A;   Family History  Problem Relation Age of Onset  . Heart disease  Mother     Heart Disease before age  13  . Other Mother     Rheumatism  . Heart attack Mother   . Heart attack Father    Social History  Substance Use Topics  . Smoking status: Former Smoker -- 0.80 packs/day for 78 years    Types: Cigarettes    Quit date: 08/05/2011  . Smokeless tobacco: Current User    Types: Chew    Last Attempt to Quit: 08/11/2011  . Alcohol Use: No    Review of Systems  Musculoskeletal: Positive for myalgias, joint swelling and arthralgias.       Right great toe  Skin: Positive for color change ( blood blisters) and wound.  Neurological: Positive for weakness ( Right great toe due to pain). Negative for numbness.    Allergies  Ceftriaxone; Clindamycin/lincomycin; Lincomycin; and Ceftriaxone sodium in dextrose  Home Medications   Prior to Admission medications   Medication Sig Start Date End Date Taking? Authorizing Provider  allopurinol (ZYLOPRIM) 100 MG tablet TAKE 1 TABLET (100 MG TOTAL) BY MOUTH DAILY. 05/07/15   Rosalita Chessman Chase, DO  amoxicillin (AMOXIL) 500 MG capsule Take 1 capsule (500 mg total) by mouth 2 (two) times daily. For 7 days 01/04/16   Noland Fordyce,  PA-C  Azelastine HCl 0.15 % SOLN 1 spray each nostril bid 10/30/14   Alferd Apa Lowne Chase, DO  bisoprolol (ZEBETA) 10 MG tablet TAKE 1 TABLET EVERY DAY 11/05/15   Larey Dresser, MD  budesonide-formoterol [facility]) 160-4.5 MCG/ACT inhaler Inhale 2 puffs into the lungs 2 (two) times daily. 02/14/15   Colon Branch, MD  calcitRIOL (ROCALTROL) 0.25 MCG capsule Take 1 capsule (0.25 mcg total) by mouth daily. 10/30/14   Rosalita Chessman Chase, DO  ergocalciferol (VITAMIN D2) 50000 UNITS capsule Take 1 capsule (50,000 Units total) by mouth once a week. 11/03/14   Alferd Apa Lowne Chase, DO  furosemide (LASIX) 40 MG tablet TAKE 2 TABLETS EVERY MORNING  AND TAKE 1 TABLET EVERY EVENING 11/05/15   Larey Dresser, MD  ipratropium-albuterol (DUONEB) 0.5-2.5 (3) MG/3ML SOLN Take 3 mLs by nebulization every 6 (six)  hours as needed. 02/02/15   Rosalita Chessman Chase, DO  losartan (COZAAR) 25 MG tablet TAKE 1/2 TABLET EVERY DAY 11/05/15   Larey Dresser, MD  NONFORMULARY OR COMPOUNDED ITEM Power wheelchair #1  Dx - osteoarthritis, copd, atrial fib, chf 02/02/15   Rosalita Chessman Chase, DO  pravastatin (PRAVACHOL) 80 MG tablet TAKE 1 TABLET EVERY EVENING 11/06/15   Jolaine Artist, MD  predniSONE (DELTASONE) 10 MG tablet 2 tablets a day for 5 days 02/14/15   Colon Branch, MD  tiotropium (SPIRIVA HANDIHALER) 18 MCG inhalation capsule Place 1 capsule (18 mcg total) into inhaler and inhale daily. 10/28/13   Rigoberto Noel, MD  warfarin (COUMADIN) 5 MG tablet TAKE AS DIRECTED. 05/31/15   Rosalita Chessman Chase, DO   Meds Ordered and Administered this Visit  Medications - No data to display  BP 164/96 mmHg  Pulse 76  Temp(Src) 97.7 F (36.5 C) (Oral)  Resp 16  SpO2 98% No data found.   Physical Exam  Constitutional: He is oriented to person, place, and time. He appears well-developed and well-nourished.  HENT:  Head: Normocephalic and atraumatic.  Eyes: EOM are normal.  Neck: Normal range of motion.  Cardiovascular: Normal rate.   Pulses:      Dorsalis pedis pulses are 2+ on the right side.  Pulmonary/Chest: Effort normal.  Musculoskeletal: He exhibits edema ( mild edema Right great toe ) and tenderness.  Right great toe: limited flexion due to pain and swelling. Mild edema with tenderness. Right foot: no tenderness to foot or other 4 toes, full ROM.  Neurological: He is alert and oriented to person, place, and time.  Normal sensation to Right great toe.   Skin: Skin is warm and dry.  Right great toe: nail in tact, 2 large blood blisters with oozing red blood. Tender. No foreign bodies seen or palpated.  Psychiatric: He has a normal mood and affect. His behavior is normal.  Nursing note and vitals reviewed.   ED Course  Procedures (including critical care time)  Labs Review Labs Reviewed - No data to  display  Imaging Review Dg Foot Complete Right  01/04/2016  CLINICAL DATA:  Dropped probe vein tank on right foot. Pain of the distal great toe. EXAM: RIGHT FOOT COMPLETE - 3+ VIEW COMPARISON:  None. FINDINGS: There is a crush fracture of the distal phalanx of the great toe affecting the distal tuft in the midportion of the bone. No fracture line extends to the articular surface however. No other bone appears to be affected. There are advanced chronic degenerative changes of the MTP  joint of the great toe. IMPRESSION: Crush injury of the distal phalanx of the great toe without involvement of the IP joint Electronically Signed   By: Nelson Chimes M.D.   On: 01/04/2016 16:13      MDM   1. Open fracture of great toe, right, initial encounter   2. Injury of great toe, right, initial encounter    Injury to Right great toe, oozing red blood, 2 blood blisters. Nail in tact. Sensation and circulation in tact. Plain films c/w crush injury to distal phalanx of great toe  Consulted with Dr. Dianah Field, Sports Medicine.  Fracture should heal well with bandage and post-op shoe.  Rx: amoxicillin as pt's medical records indicate lip swelling and hives with ceftriaxone and clindamycin. Pt denies known reaction to PCN or amoxicillin.  Pt on coumadin, cannot give doxycycline. Advised to discontinue medication and go to emergency departmtent or call [facility] if signs of allergic reaction.    Call to schedule f/u appointment with Dr. Dianah Field, Sports Medicine, early next week.     Noland Fordyce, PA-C 01/04/16 1646

## 2016-01-04 NOTE — ED Notes (Signed)
A propane tank rolled out of his car and fell on right big toe.  Bloodied toe.  Happened around 8:30 this am.

## 2016-02-10 ENCOUNTER — Other Ambulatory Visit: Payer: Self-pay | Admitting: Family Medicine

## 2016-02-11 NOTE — Telephone Encounter (Signed)
Last seen 02/02/15, please advise    KP

## 2016-02-21 ENCOUNTER — Encounter: Payer: Self-pay | Admitting: Family Medicine

## 2016-02-21 ENCOUNTER — Ambulatory Visit (INDEPENDENT_AMBULATORY_CARE_PROVIDER_SITE_OTHER): Payer: Medicare FFS | Admitting: Family Medicine

## 2016-02-21 VITALS — BP 120/79 | HR 69 | Temp 98.1°F | Ht 66.0 in | Wt 177.6 lb

## 2016-02-21 DIAGNOSIS — Z7901 Long term (current) use of anticoagulants: Secondary | ICD-10-CM | POA: Diagnosis not present

## 2016-02-21 DIAGNOSIS — R062 Wheezing: Secondary | ICD-10-CM | POA: Diagnosis not present

## 2016-02-21 DIAGNOSIS — J441 Chronic obstructive pulmonary disease with (acute) exacerbation: Secondary | ICD-10-CM

## 2016-02-21 DIAGNOSIS — I4819 Other persistent atrial fibrillation: Secondary | ICD-10-CM

## 2016-02-21 DIAGNOSIS — E785 Hyperlipidemia, unspecified: Secondary | ICD-10-CM | POA: Diagnosis not present

## 2016-02-21 DIAGNOSIS — I481 Persistent atrial fibrillation: Secondary | ICD-10-CM

## 2016-02-21 DIAGNOSIS — E1121 Type 2 diabetes mellitus with diabetic nephropathy: Secondary | ICD-10-CM

## 2016-02-21 MED ORDER — AZITHROMYCIN 250 MG PO TABS: ORAL_TABLET | ORAL | Status: DC

## 2016-02-21 MED ORDER — IPRATROPIUM-ALBUTEROL 0.5-2.5 (3) MG/3ML IN SOLN
3.0000 mL | Freq: Once | RESPIRATORY_TRACT | Status: AC
  Administered 2016-02-21: 3 mL via RESPIRATORY_TRACT

## 2016-02-21 MED ORDER — PREDNISONE 20 MG PO TABS: ORAL_TABLET | ORAL | Status: DC

## 2016-02-21 MED ORDER — IPRATROPIUM-ALBUTEROL 0.5-2.5 (3) MG/3ML IN SOLN: 3.0000 mL | Freq: Once | RESPIRATORY_TRACT | Status: DC

## 2016-02-21 MED FILL — predniSONE 20 MG TABS: 20 | 5 days supply | Qty: 5 | Fill #0

## 2016-02-21 MED FILL — AZITHROMYCIN 250 MG TABLET: 250 | 5 days supply | Qty: 6 | Fill #0

## 2016-02-21 NOTE — Progress Notes (Addendum)
[facility] [address] [place], [place] [address] [phone]  Date:  02/21/2016   Name:  ESHAWN OHAGAN   DOB:  1930-03-28   MRN:  [id number]  PCP:  Ann Held, DO    Chief Complaint: No chief complaint on file.   History of Present Illness:  DAMIN EBERLE is a 82 y.o. very pleasant male patient who presents with the following:  History of DM (not on medication), COPD on symbicort and spiriva, chronic a fib on coumadin, HTN, CHF (EF one year ago 15- 45%).  Here today with illness- he has noted a cough and increased WOB for about 1 week.   The cough is not productive but he feels like his chest is congested.   He is using his duoneb at home per his report  No fever.  He has felt tired and achy however.  No sore throat or ear ache His appetite is not as good as usual No vomiting or diarrhea He has not tried any medication for his cough so far except cough drops  He states that he is still taking coumadin.  We have not seen him and he has not had an INR in one year, or an A1c in over a year.  He states that he felt like it was not necessary for him to continue to have follow-up testing so he stopped coming in He is on duoneb and used a dose of it this morning He is wheezing currently on exam He does not use oxygen He is still able to do his normal activities such as moving on a riding mower  Lab Results  Component Value Date   INR 2.7 02/14/2015   INR 2.7* 10/30/2014   INR 3.0 06/05/2014   Lab Results  Component Value Date   HGBA1C 6.5 10/30/2014   BP Readings from Last 3 Encounters:  02/21/16 120/79  01/04/16 164/96  02/14/15 124/78    Patient Active Problem List   Diagnosis Date Noted  . Diabetes mellitus with renal manifestations, uncontrolled ([place]) 10/30/2014  . Obesity (BMI 30-39.9) 12/05/2013  . Encounter for therapeutic drug monitoring 08/26/2013  . Gout 05/12/2013  . Ascending aortic  aneurysm ([place]) 02/15/2013  . Persistent atrial fibrillation ([place]) 01/06/2013  . Diabetes mellitus with renal manifestation ([place]) 12/20/2012  . Chronic renal failure 11/24/2012  . GI bleed 11/14/2012  . Atrial fibrillation with RVR ([place]) 11/12/2012  . CAP (community acquired pneumonia) 11/12/2012  . CHF (congestive heart failure) ([place]) 11/12/2012  . HTN (hypertension) 11/12/2012  . Diarrhea 11/12/2012  . Oral thrush 11/12/2012  . Edema of leg 11/12/2012  . Long term (current) use of anticoagulants 10/28/2012  . PAF (paroxysmal atrial fibrillation) ([place]) 10/24/2012  . Atrial fibrillation ([place]) 10/24/2012  . CAD (coronary artery disease)   . Ischemic cardiomyopathy   . Pulmonary fibrosis ([place])   . Chronic combined systolic and diastolic CHF (congestive heart failure) ([place])   . Hyperlipidemia   . Hypertension   . Leukocytosis, unspecified 10/23/2012  . Thrombocytopenia ([place]) 10/23/2012  . Dehydration 10/23/2012  . Vomiting 10/17/2011  . OA (osteoarthritis of spine) 06/10/2011  . COPD (chronic obstructive pulmonary disease) ([place]) 09/17/2010  . CLAUDICATION, INTERMITTENT 12/11/2009  . VARICOSE VEINS, LOWER EXTREMITIES, WITH INFLAMMATION 12/11/2009  . ALKALINE PHOSPHATASE, ELEVATED 12/11/2009  . NEVI, MULTIPLE 07/24/2009  . MEMORY LOSS 07/24/2009  . INSECT BITE 03/27/2009  . VARICOSE VEINS LOWER EXTREMITIES W/OTH  COMPS 05/22/2008  . HYPERLIPIDEMIA 10/12/2007  . HYPERTENSION 10/12/2007  . ABDOMINAL ANEURYSM WITHOUT MENTION OF RUPTURE 10/12/2007  . CORONARY HEART DISEASE 08/13/2007  . Congestive heart failure, unspecified 08/13/2007  . Unspecified sleep apnea 08/13/2007  . MEDIASTINAL ADENOPATHY CA[place]MANS D 08/13/2007    Past Medical History  Diagnosis Date  . Hypertension   . Hyperlipidemia   . Chronic combined systolic and diastolic CHF (congestive heart failure) ([place])     a. 10/2012 Echo: EF 40-45%, Gr1 dd, postlat AK, mild to mod AS, Triv AI, Mild MR, mildly dil LA.  .  Sleep apnea   . Aortic aneurysm, abdominal ([place])     f/u by [facility]  . Pulmonary fibrosis ([place])   . Ischemic cardiomyopathy     a. EF 40-45% by echo 10/2012  . CAD (coronary artery disease)     a. s/p prior MI;  b. 11/1999 Cath: LM nl, LAD 8m, LCX min irregs, OM 100, RCA 80p, 100d, L->L and L->R collats, EF 54%.  Marland Kitchen COPD (chronic obstructive pulmonary disease) ([place])   . PAF (paroxysmal atrial fibrillation) ([place])     a. Dx 10/2012 during hosp for resp failure/copd flare  . Aortic stenosis     mild to mod by echo 10/2012 (mean gradient 14 mmHg)  . Shortness of breath   . GERD (gastroesophageal reflux disease)     Past Surgical History  Procedure Laterality Date  . Neck surgery    . Arm surgery    . Back surgery    . Leg surgery    . Cardioversion N/A 01/20/2013    Procedure: CARDIOVERSION;  Surgeon: Darlin Coco, MD;  Location[facility] ENDOSCOPY;  Service: Cardiovascular;  Laterality: N/A;    Social History  Substance Use Topics  . Smoking status: Former Smoker -- 0.80 packs/day for 78 years    Types: Cigarettes    Quit date: 08/05/2011  . Smokeless tobacco: Current User    Types: Chew    Last Attempt to Quit: 08/11/2011  . Alcohol Use: No    Family History  Problem Relation Age of Onset  . Heart disease Mother     Heart Disease before age  55  . Other Mother     Rheumatism  . Heart attack Mother   . Heart attack Father     Allergies  Allergen Reactions  . Ceftriaxone Swelling    Lips swell and throat closes.  Pt has had Penicillin and amoxicillin before without reaction.  . Clindamycin/Lincomycin Hives  . Lincomycin Hives  . Ceftriaxone Sodium In Dextrose Swelling and Other (See Comments)    Causes lip swelling and redness    Medication list has been reviewed and updated.  Current Outpatient Prescriptions on File Prior to Visit  Medication Sig Dispense Refill  . allopurinol (ZYLOPRIM) 100 MG tablet TAKE 1 TABLET (100 MG TOTAL) BY MOUTH DAILY. 90 tablet 1  .  amoxicillin (AMOXIL) 500 MG capsule Take 1 capsule (500 mg total) by mouth 2 (two) times daily. For 7 days 14 capsule 0  . Azelastine HCl 0.15 % SOLN 1 spray each nostril bid 30 mL 5  . bisoprolol (ZEBETA) 10 MG tablet TAKE 1 TABLET EVERY DAY 90 tablet 3  . budesonide-formoterol (SYMBICORT) 160-4.5 MCG/ACT inhaler Inhale 2 puffs into the lungs 2 (two) times daily. 1 Inhaler 6  . calcitRIOL (ROCALTROL) 0.25 MCG capsule Take 1 capsule (0.25 mcg total) by mouth daily. 90 capsule 3  . ergocalciferol (VITAMIN D2) 50000 UNITS capsule Take 1 capsule (  50,000 Units total) by mouth once a week. 4 capsule 3  . furosemide (LASIX) 40 MG tablet TAKE 2 TABLETS EVERY MORNING  AND TAKE 1 TABLET EVERY EVENING 270 tablet 3  . ipratropium-albuterol (DUONEB) 0.5-2.5 (3) MG/3ML SOLN Take 3 mLs by nebulization every 6 (six) hours as needed. 360 mL 5  . losartan (COZAAR) 25 MG tablet TAKE 1/2 TABLET EVERY DAY 45 tablet 1  . NONFORMULARY OR COMPOUNDED ITEM Power wheelchair #1  Dx - osteoarthritis, copd, atrial fib, chf 1 each 0  . pravastatin (PRAVACHOL) 80 MG tablet TAKE 1 TABLET EVERY EVENING 90 tablet 3  . predniSONE (DELTASONE) 10 MG tablet 2 tablets a day for 5 days 10 tablet 0  . tiotropium (SPIRIVA HANDIHALER) 18 MCG inhalation capsule Place 1 capsule (18 mcg total) into inhaler and inhale daily. 30 capsule 0  . warfarin (COUMADIN) 5 MG tablet TAKE AS DIRECTED. 30 tablet 3   No current facility-administered medications on file prior to visit.    Review of Systems:  As per HPI- otherwise negative.    Physical Examination: Filed Vitals:   02/21/16 1355  BP: 120/79  Pulse: 69  Temp: 98.1 F (36.7 C)    Ideal Body Weight:   GEN: WDWN, NAD, Non-toxic, A & O x 3, looks well but is audibly wheezing.   HEENT: Atraumatic, Normocephalic. Neck supple. No masses, No LAD. Ears and Nose: No external deformity. CV: irregular rhythm consitent with rate controlled a fib, No M/G/R. No JVD. No thrill. No extra heart  sounds. PULM: diffuse wheezing bilaterally, no crackles, rhonchi. No retractions. No resp. distress. No accessory muscle use. ABD: S, NT, ND. No rebound. No HSM. EXTR: No c/c/e NEURO slow gait PSYCH: Normally interactive. Conversant. Not depressed or anxious appearing.  Calm demeanor.   Given a duoneb treatment: improved his wheezing. Moving better air  SpO2 Readings from Last 3 Encounters:  02/21/16 94%  01/04/16 98%  02/14/15 96%   Wt Readings from Last 3 Encounters:  02/21/16 177 lb 9.6 oz (80.559 kg)  02/14/15 189 lb (85.73 kg)  02/02/15 188 lb 6.4 oz (85.458 kg)    Assessment and Plan: COPD exacerbation ([place]) - Plan: predniSONE (DELTASONE) 20 MG tablet, azithromycin (ZITHROMAX) 250 MG tablet, DISCONTINUED: ipratropium-albuterol (DUONEB) 0.5-2.5 (3) MG/3ML nebulizer solution 3 mL  Wheezing - Plan: ipratropium-albuterol (DUONEB) 0.5-2.5 (3) MG/3ML nebulizer solution 3 mL, DISCONTINUED: ipratropium-albuterol (DUONEB) 0.5-2.5 (3) MG/3ML nebulizer solution 3 mL  Chronic anticoagulation - Plan: CBC, INR/PT  Type 2 diabetes mellitus with diabetic nephropathy, without long-term current use of insulin ([facility]) - Plan: Comprehensive metabolic panel, Hemoglobin A1c  Persistent atrial fibrillation ([place]) - Plan: INR/PT  Hyperlipidemia - Plan: Lipid panel  Here today with a COPD exacerbation of one weeks duration.  He states that today he is significantly better than he was yesterday. Treat with prednisone, albuterol.   Explained to him the importance of keeping up with his coumadin monitoring.  Also advised that we will need to follow his INR closely while he is on antibiotics as this can change his coumain function Check INR with other labs today, also CBC, A1c He is in persistent atrial fib but does not appear to be volume overloaded and his weight is down from last year about 10 lbs, no crackles on lung exam  It seems that you are having an exacerbation of your COPD We will check your  labs today- we need to check on your coumadin and diabetes labs Use your nebulizer treatment every  6 hours or so for the next few days Also use the prednisone one a day for 5 days, and the azithromycin antibiotic as directed  Please come and see [place] in one week for a nurse visit to check your INR!   Let me know if you are not feeling better  Signed Lamar Blinks, MD  7/21- received his labs, called to check on him.  Reached someone at his listed number who at first stated he was the pt, but then said he was not and that I had the wrong number.  Called his daughter Vicente Males and left message- INR ok, please make sure he comes in next week for recheck   Results for orders placed or performed in visit on 02/21/16  CBC  Result Value Ref Range   WBC 9.1 4.0 - 10.5 K/uL   RBC 4.65 4.22 - 5.81 Mil/uL   Platelets 149.0 (L) 150.0 - 400.0 K/uL   Hemoglobin 12.2 (L) 13.0 - 17.0 g/dL   HCT 38.4 (L) 39.0 - 52.0 %   MCV 82.7 78.0 - 100.0 fl   MCHC 31.8 30.0 - 36.0 g/dL   RDW 16.1 (H) 11.5 - 15.5 %  Comprehensive metabolic panel  Result Value Ref Range   Sodium 141 135 - 145 mEq/L   Potassium 4.1 3.5 - 5.1 mEq/L   Chloride 101 96 - 112 mEq/L   CO2 33 (H) 19 - 32 mEq/L   Glucose, Bld 88 70 - 99 mg/dL   BUN 39 (H) 6 - 23 mg/dL   Creatinine, Ser 1.63 (H) 0.40 - 1.50 mg/dL   Total Bilirubin 1.1 0.2 - 1.2 mg/dL   Alkaline Phosphatase 123 (H) 39 - 117 U/L   AST 18 0 - 37 U/L   ALT 10 0 - 53 U/L   Total Protein 8.3 6.0 - 8.3 g/dL   Albumin 4.0 3.5 - 5.2 g/dL   Calcium 10.1 8.4 - 10.5 mg/dL   GFR 43.01 (L) >60.00 mL/min  Lipid panel  Result Value Ref Range   Cholesterol 127 0 - 200 mg/dL   Triglycerides 115.0 0.0 - 149.0 mg/dL   HDL 30.30 (L) >39.00 mg/dL   VLDL 23.0 0.0 - 40.0 mg/dL   LDL Cholesterol 74 0 - 99 mg/dL   Total CHOL/HDL Ratio 4    NonHDL 97.10   Hemoglobin A1c  Result Value Ref Range   Hgb A1c MFr Bld 6.2 4.6 - 6.5 %  INR/PT  Result Value Ref Range   INR 3.0 (H) 0.8 - 1.0  ratio   Prothrombin Time 32.2 (H) 9.6 - 13.1 sec   Called again and spoke to his daughter Vicente Males on 7/24. Her dad is doing well, we made a nurse visit for him next week to check his INR.  She reports that he is feeling better overall and that his breathing is better.  She confirms that he does not answer his phone, and that it would be best to contact her directly

## 2016-02-21 NOTE — Progress Notes (Signed)
Pre visit review using our clinic review tool, if applicable. No additional management support is needed unless otherwise documented below in the visit note. 

## 2016-02-21 NOTE — Patient Instructions (Signed)
It seems that you are having an exacerbation of your COPD We will check your labs today- we need to check on your coumadin and diabetes labs Use your nebulizer treatment every 6 hours or so for the next few days Also use the prednisone one a day for 5 days, and the azithromycin antibiotic as directed  Please come and see [place] in one week for a nurse visit to check your INR!   Let me know if you are not feeling better

## 2016-02-22 LAB — CBC
HEMATOCRIT: 38.4 % — AB (ref 39.0–52.0)
HEMOGLOBIN: 12.2 g/dL — AB (ref 13.0–17.0)
MCHC: 31.8 g/dL (ref 30.0–36.0)
MCV: 82.7 fl (ref 78.0–100.0)
Platelets: 149 10*3/uL — ABNORMAL LOW (ref 150.0–400.0)
RBC: 4.65 Mil/uL (ref 4.22–5.81)
RDW: 16.1 % — AB (ref 11.5–15.5)
WBC: 9.1 10*3/uL (ref 4.0–10.5)

## 2016-02-22 LAB — COMPREHENSIVE METABOLIC PANEL
ALT: 10 U/L (ref 0–53)
AST: 18 U/L (ref 0–37)
Albumin: 4 g/dL (ref 3.5–5.2)
Alkaline Phosphatase: 123 U/L — ABNORMAL HIGH (ref 39–117)
BUN: 39 mg/dL — ABNORMAL HIGH (ref 6–23)
CHLORIDE: 101 meq/L (ref 96–112)
CO2: 33 meq/L — AB (ref 19–32)
Calcium: 10.1 mg/dL (ref 8.4–10.5)
Creatinine, Ser: 1.63 mg/dL — ABNORMAL HIGH (ref 0.40–1.50)
GFR: 43.01 mL/min — AB (ref >60.00)
GLUCOSE: 88 mg/dL (ref 70–99)
POTASSIUM: 4.1 meq/L (ref 3.5–5.1)
Sodium: 141 mEq/L (ref 135–145)
Total Bilirubin: 1.1 mg/dL (ref 0.2–1.2)
Total Protein: 8.3 g/dL (ref 6.0–8.3)

## 2016-02-22 LAB — PROTIME-INR
INR: 3 ratio — ABNORMAL HIGH (ref 0.8–1.0)
Prothrombin Time: 32.2 s — ABNORMAL HIGH (ref 9.6–13.1)

## 2016-02-22 LAB — LIPID PANEL
CHOL/HDL RATIO: 4
Cholesterol: 127 mg/dL (ref 0–200)
HDL: 30.3 mg/dL — AB (ref >39.00)
LDL Cholesterol: 74 mg/dL (ref 0–99)
NONHDL: 97.1
Triglycerides: 115 mg/dL (ref 0.0–149.0)
VLDL: 23 mg/dL (ref 0.0–40.0)

## 2016-02-22 LAB — HEMOGLOBIN A1C: Hgb A1c MFr Bld: 6.2 % (ref 4.6–6.5)

## 2016-02-25 ENCOUNTER — Encounter: Payer: Self-pay | Admitting: Family Medicine

## 2016-03-04 ENCOUNTER — Ambulatory Visit: Payer: Medicare FFS

## 2016-05-25 ENCOUNTER — Other Ambulatory Visit: Payer: Self-pay | Admitting: Family Medicine

## 2016-05-26 ENCOUNTER — Telehealth: Payer: Self-pay | Admitting: Family Medicine

## 2016-05-26 MED ORDER — WARFARIN SODIUM 5 MG PO TABS: ORAL_TABLET | ORAL | 0 refills | Status: AC

## 2016-05-26 NOTE — Telephone Encounter (Signed)
please schedule an OV with Dr.Lowne.     KP

## 2016-05-26 NOTE — Telephone Encounter (Signed)
[facility] [id number]   Clarity on COUMADIN Rx.

## 2016-05-30 NOTE — Telephone Encounter (Signed)
Pt has been scheduled.  °

## 2016-06-03 ENCOUNTER — Ambulatory Visit (INDEPENDENT_AMBULATORY_CARE_PROVIDER_SITE_OTHER): Payer: Medicare FFS | Admitting: *Deleted

## 2016-06-03 ENCOUNTER — Ambulatory Visit: Payer: Medicare FFS | Admitting: Family Medicine

## 2016-06-03 ENCOUNTER — Encounter: Payer: Self-pay | Admitting: *Deleted

## 2016-06-03 VITALS — BP 117/78 | HR 79 | Temp 97.7°F | Resp 16 | Ht 66.0 in | Wt 179.2 lb

## 2016-06-03 DIAGNOSIS — Z23 Encounter for immunization: Secondary | ICD-10-CM | POA: Diagnosis not present

## 2016-06-03 DIAGNOSIS — I4891 Unspecified atrial fibrillation: Secondary | ICD-10-CM | POA: Diagnosis not present

## 2016-06-03 DIAGNOSIS — Z7901 Long term (current) use of anticoagulants: Secondary | ICD-10-CM

## 2016-06-03 LAB — POCT INR: INR: 1.9

## 2016-06-03 NOTE — Progress Notes (Signed)
Pre visit review using our clinic review tool, if applicable. No additional management support is needed unless otherwise documented below in the visit note. 

## 2016-06-03 NOTE — Patient Instructions (Addendum)
We completed your Annual Wellness Exam today.    You received the flu vaccine today.    Follow up with Dr. Etter Sjogren as scheduled on Monday, June 09, 2016 at 9:15 am.    You have a physical exam scheduled on Tuesday July 08, 2016 at 3:30 pm .    Take medications as directed by your provider.  Maintain a healthy weight and a trim waistline.  Eat healthy meals and snacks, rich in fruits, vegetables, whole grains and lean proteins.  Aim for 150 minutes of moderate physical activity each week.  Get seven to nine hours of quality sleep each night.       Heart Failure Heart failure is a condition in which the heart has trouble pumping blood. This means your heart does not pump blood efficiently for your body to work well. In some cases of heart failure, fluid may back up into your lungs or you may have swelling (edema) in your lower legs. Heart failure is usually a long-term (chronic) condition. It is important for you to take good care of yourself and follow your health care provider's treatment plan. CAUSES  Some health conditions can cause heart failure. Those health conditions include:  High blood pressure (hypertension). Hypertension causes the heart muscle to work harder than normal. When pressure in the blood vessels is high, the heart needs to pump (contract) with more force in order to circulate blood throughout the body. High blood pressure eventually causes the heart to become stiff and weak.  Coronary artery disease (CAD). CAD is the buildup of cholesterol and fat (plaque) in the arteries of the heart. The blockage in the arteries deprives the heart muscle of oxygen and blood. This can cause chest pain and may lead to a heart attack. High blood pressure can also contribute to CAD.  Heart attack (myocardial infarction). A heart attack occurs when one or more arteries in the heart become blocked. The loss of oxygen damages the muscle tissue of the heart. When this happens, part of  the heart muscle dies. The injured tissue does not contract as well and weakens the heart's ability to pump blood.  Abnormal heart valves. When the heart valves do not open and close properly, it can cause heart failure. This makes the heart muscle pump harder to keep the blood flowing.  Heart muscle disease (cardiomyopathy or myocarditis). Heart muscle disease is damage to the heart muscle from a variety of causes. These can include drug or alcohol abuse, infections, or unknown reasons. These can increase the risk of heart failure.  Lung disease. Lung disease makes the heart work harder because the lungs do not work properly. This can cause a strain on the heart, leading it to fail.  Diabetes. Diabetes increases the risk of heart failure. High blood sugar contributes to high fat (lipid) levels in the blood. Diabetes can also cause slow damage to tiny blood vessels that carry important nutrients to the heart muscle. When the heart does not get enough oxygen and food, it can cause the heart to become weak and stiff. This leads to a heart that does not contract efficiently.  Other conditions can contribute to heart failure. These include abnormal heart rhythms, thyroid problems, and low blood counts (anemia). Certain unhealthy behaviors can increase the risk of heart failure, including:  Being overweight.  Smoking or chewing tobacco.  Eating foods high in fat and cholesterol.  Abusing illicit drugs or alcohol.  Lacking physical activity. SYMPTOMS  Heart failure symptoms may  vary and can be hard to detect. Symptoms may include:  Shortness of breath with activity, such as climbing stairs.  Persistent cough.  Swelling of the feet, ankles, legs, or abdomen.  Unexplained weight gain.  Difficulty breathing when lying flat (orthopnea).  Waking from sleep because of the need to sit up and get more air.  Rapid heartbeat.  Fatigue and loss of energy.  Feeling light-headed, dizzy, or  close to fainting.  Loss of appetite.  Nausea.  Increased urination during the night (nocturia). DIAGNOSIS  A diagnosis of heart failure is based on your history, symptoms, physical examination, and diagnostic tests. Diagnostic tests for heart failure may include:  Echocardiography.  Electrocardiography.  Chest X-ray.  Blood tests.  Exercise stress test.  Cardiac angiography.  Radionuclide scans. TREATMENT  Treatment is aimed at managing the symptoms of heart failure. Medicines, behavioral changes, or surgical intervention may be necessary to treat heart failure.  Medicines to help treat heart failure may include:  Angiotensin-converting enzyme (ACE) inhibitors. This type of medicine blocks the effects of a blood protein called angiotensin-converting enzyme. ACE inhibitors relax (dilate) the blood vessels and help lower blood pressure.  Angiotensin receptor blockers (ARBs). This type of medicine blocks the actions of a blood protein called angiotensin. Angiotensin receptor blockers dilate the blood vessels and help lower blood pressure.  Water pills (diuretics). Diuretics cause the kidneys to remove salt and water from the blood. The extra fluid is removed through urination. This loss of extra fluid lowers the volume of blood the heart pumps.  Beta blockers. These prevent the heart from beating too fast and improve heart muscle strength.  Digitalis. This increases the force of the heartbeat.  Healthy behavior changes include:  Obtaining and maintaining a healthy weight.  Stopping smoking or chewing tobacco.  Eating heart-healthy foods.  Limiting or avoiding alcohol.  Stopping illicit drug use.  Physical activity as directed by your health care provider.  Surgical treatment for heart failure may include:  A procedure to open blocked arteries, repair damaged heart valves, or remove damaged heart muscle tissue.  A pacemaker to improve heart muscle function and  control certain abnormal heart rhythms.  An internal cardioverter defibrillator to treat certain serious abnormal heart rhythms.  A left ventricular assist device (LVAD) to assist the pumping ability of the heart. HOME CARE INSTRUCTIONS   Take medicines only as directed by your health care provider. Medicines are important in reducing the workload of your heart, slowing the progression of heart failure, and improving your symptoms.  Do not stop taking your medicine unless directed by your health care provider.  Do not skip any dose of medicine.  Refill your prescriptions before you run out of medicine. Your medicines are needed every day.  Engage in moderate physical activity if directed by your health care provider. Moderate physical activity can benefit some people. The elderly and people with severe heart failure should consult with a health care provider for physical activity recommendations.  Eat heart-healthy foods. Food choices should be free of trans fat and low in saturated fat, cholesterol, and salt (sodium). Healthy choices include fresh or frozen fruits and vegetables, fish, lean meats, legumes, fat-free or low-fat dairy products, and whole grain or high fiber foods. Talk to a dietitian to learn more about heart-healthy foods.  Limit sodium if directed by your health care provider. Sodium restriction may reduce symptoms of heart failure in some people. Talk to a dietitian to learn more about heart-healthy seasonings.  Use  healthy cooking methods. Healthy cooking methods include roasting, grilling, broiling, baking, poaching, steaming, or stir-frying. Talk to a dietitian to learn more about healthy cooking methods.  Limit fluids if directed by your health care provider. Fluid restriction may reduce symptoms of heart failure in some people.  Weigh yourself every day. Daily weights are important in the early recognition of excess fluid. You should weigh yourself every morning after  you urinate and before you eat breakfast. Wear the same amount of clothing each time you weigh yourself. Record your daily weight. Provide your health care provider with your weight record.  Monitor and record your blood pressure if directed by your health care provider.  Check your pulse if directed by your health care provider.  Lose weight if directed by your health care provider. Weight loss may reduce symptoms of heart failure in some people.  Stop smoking or chewing tobacco. Nicotine makes your heart work harder by causing your blood vessels to constrict. Do not use nicotine gum or patches before talking to your health care provider.  Keep all follow-up visits as directed by your health care provider. This is important.  Limit alcohol intake to no more than 1 drink per day for nonpregnant women and 2 drinks per day for men. One drink equals 12 ounces of beer, 5 ounces of wine, or 1 ounces of hard liquor. Drinking more than that is harmful to your heart. Tell your health care provider if you drink alcohol several times a week. Talk with your health care provider about whether alcohol is safe for you. If your heart has already been damaged by alcohol or you have severe heart failure, drinking alcohol should be stopped completely.  Stop illicit drug use.  Stay up-to-date with immunizations. It is especially important to prevent respiratory infections through current pneumococcal and influenza immunizations.  Manage other health conditions such as hypertension, diabetes, thyroid disease, or abnormal heart rhythms as directed by your health care provider.  Learn to manage stress.  Plan rest periods when fatigued.  Learn strategies to manage high temperatures. If the weather is extremely hot:  Avoid vigorous physical activity.  Use air conditioning or fans or seek a cooler location.  Avoid caffeine and alcohol.  Wear loose-fitting, lightweight, and light-colored clothing.  Learn  strategies to manage cold temperatures. If the weather is extremely cold:  Avoid vigorous physical activity.  Layer clothes.  Wear mittens or gloves, a hat, and a scarf when going outside.  Avoid alcohol.  Obtain ongoing education and support as needed.  Participate in or seek rehabilitation as needed to maintain or improve independence and quality of life. SEEK MEDICAL CARE IF:   You have a rapid weight gain.  You have increasing shortness of breath that is unusual for you.  You are unable to participate in your usual physical activities.  You tire easily.  You cough more than normal, especially with physical activity.  You have any or more swelling in areas such as your hands, feet, ankles, or abdomen.  You are unable to sleep because it is hard to breathe.  You feel like your heart is beating fast (palpitations).  You become dizzy or light-headed upon standing up. SEEK IMMEDIATE MEDICAL CARE IF:   You have difficulty breathing.  There is a change in mental status such as decreased alertness or difficulty with concentration.  You have a pain or discomfort in your chest.  You have an episode of fainting (syncope). MAKE SURE YOU:   Understand  these instructions.  Will watch your condition.  Will get help right away if you are not doing well or get worse.   This information is not intended to replace advice given to you by your health care provider. Make sure you discuss any questions you have with your health care provider.   Document Released: 07/21/2005 Document Revised: 12/05/2014 Document Reviewed: 08/20/2012 Elsevier Interactive Patient Education 2016 [employer] Type 2 Diabetes Mellitus, Adult Type 2 diabetes mellitus, often simply referred to as type 2 diabetes, is a long-lasting (chronic) disease. In type 2 diabetes, the pancreas does not make enough insulin (a hormone), the cells are less responsive to the insulin that is made (insulin resistance), or  both. Normally, insulin moves sugars from food into the tissue cells. The tissue cells use the sugars for energy. The lack of insulin or the lack of normal response to insulin causes excess sugars to build up in the blood instead of going into the tissue cells. As a result, high blood sugar (hyperglycemia) develops. The effect of high sugar (glucose) levels can cause many complications. Type 2 diabetes was also previously called adult-onset diabetes, but it can occur at any age.  RISK FACTORS  A person is predisposed to developing type 2 diabetes if someone in the family has the disease and also has one or more of the following primary risk factors:  Weight gain, or being overweight or obese.  An inactive lifestyle.  A history of consistently eating high-calorie foods. Maintaining a normal weight and regular physical activity can reduce the chance of developing type 2 diabetes. SYMPTOMS  A person with type 2 diabetes may not show symptoms initially. The symptoms of type 2 diabetes appear slowly. The symptoms include:  Increased thirst (polydipsia).  Increased urination (polyuria).  Increased urination during the night (nocturia).  Sudden or unexplained weight changes.  Frequent, recurring infections.  Tiredness (fatigue).  Weakness.  Vision changes, such as blurred vision.  Fruity smell to your breath.  Abdominal pain.  Nausea or vomiting.  Cuts or bruises which are slow to heal.  Tingling or numbness in the hands or feet.  An open skin wound (ulcer). DIAGNOSIS Type 2 diabetes is frequently not diagnosed until complications of diabetes are present. Type 2 diabetes is diagnosed when symptoms or complications are present and when blood glucose levels are increased. Your blood glucose level may be checked by one or more of the following blood tests:  A fasting blood glucose test. You will not be allowed to eat for at least 8 hours before a blood sample is taken.  A  random blood glucose test. Your blood glucose is checked at any time of the day regardless of when you ate.  A hemoglobin A1c blood glucose test. A hemoglobin A1c test provides information about blood glucose control over the previous 3 months.  An oral glucose tolerance test (OGTT). Your blood glucose is measured after you have not eaten (fasted) for 2 hours and then after you drink a glucose-containing beverage. TREATMENT   You may need to take insulin or diabetes medicine daily to keep blood glucose levels in the desired range.  If you use insulin, you may need to adjust the dosage depending on the carbohydrates that you eat with each meal or snack.  Lifestyle changes are recommended as part of your treatment. These may include:  Following an individualized diet plan developed by a nutritionist or dietitian.  Exercising daily. Your health care providers will set individualized treatment goals  for you based on your age, your medicines, how long you have had diabetes, and any other medical conditions you have. Generally, the goal of treatment is to maintain the following blood glucose levels:  Before meals (preprandial): 80-130 mg/dL.  After meals (postprandial): below 180 mg/dL.  A1c: less than 6.5-7%. HOME CARE INSTRUCTIONS   Have your hemoglobin A1c level checked twice a year.  Perform daily blood glucose monitoring as directed by your health care provider.  Monitor urine ketones when you are ill and as directed by your health care provider.  Take your diabetes medicine or insulin as directed by your health care provider to maintain your blood glucose levels in the desired range.  Never run out of diabetes medicine or insulin. It is needed every day.  If you are using insulin, you may need to adjust the amount of insulin given based on your intake of carbohydrates. Carbohydrates can raise blood glucose levels but need to be included in your diet. Carbohydrates provide vitamins,  minerals, and fiber which are an essential part of a healthy diet. Carbohydrates are found in fruits, vegetables, whole grains, dairy products, legumes, and foods containing added sugars.  Eat healthy foods. You should make an appointment to see a registered dietitian to help you create an eating plan that is right for you.  Lose weight if you are overweight.  Carry a medical alert card or wear your medical alert jewelry.  Carry a 15-gram carbohydrate snack with you at all times to treat low blood glucose (hypoglycemia). Some examples of 15-gram carbohydrate snacks include:  Glucose tablets, 3 or 4.  Glucose gel, 15-gram tube.  Raisins, 2 tablespoons (24 grams).  Jelly beans, 6.  Animal crackers, 8.  Regular pop, 4 ounces (120 mL).  Gummy treats, 9.  Recognize hypoglycemia. Hypoglycemia occurs with blood glucose levels of 70 mg/dL and below. The risk for hypoglycemia increases when fasting or skipping meals, during or after intense exercise, and during sleep. Hypoglycemia symptoms can include:  Tremors or shakes.  Decreased ability to concentrate.  Sweating.  Increased heart rate.  Headache.  Dry mouth.  Hunger.  Irritability.  Anxiety.  Restless sleep.  Altered speech or coordination.  Confusion.  Treat hypoglycemia promptly. If you are alert and able to safely swallow, follow the 15:15 rule:  Take 15-20 grams of rapid-acting glucose or carbohydrate. Rapid-acting options include glucose gel, glucose tablets, or 4 ounces (120 mL) of fruit juice, regular soda, or low-fat milk.  Check your blood glucose level 15 minutes after taking the glucose.  Take 15-20 grams more of glucose if the repeat blood glucose level is still 70 mg/dL or below.  Eat a meal or snack within 1 hour once blood glucose levels return to normal.  Be alert to feeling very thirsty and urinating more frequently than usual, which are early signs of hyperglycemia. An early awareness of  hyperglycemia allows for prompt treatment. Treat hyperglycemia as directed by your health care provider.  Engage in at least 150 minutes of moderate-intensity physical activity a week, spread over at least 3 days of the week or as directed by your health care provider. In addition, you should engage in resistance exercise at least 2 times a week or as directed by your health care provider. Try to spend no more than 90 minutes at one time inactive.  Adjust your medicine and food intake as needed if you start a new exercise or sport.  Follow your sick-day plan anytime you are unable to  eat or drink as usual.  Do not use any tobacco products including cigarettes, chewing tobacco, or electronic cigarettes. If you need help quitting, ask your health care provider.  Limit alcohol intake to no more than 1 drink per day for nonpregnant women and 2 drinks per day for men. You should drink alcohol only when you are also eating food. Talk with your health care provider whether alcohol is safe for you. Tell your health care provider if you drink alcohol several times a week.  Keep all follow-up visits as directed by your health care provider. This is important.  Schedule an eye exam soon after the diagnosis of type 2 diabetes and then annually.  Perform daily skin and foot care. Examine your skin and feet daily for cuts, bruises, redness, nail problems, bleeding, blisters, or sores. A foot exam by a health care provider should be done annually.  Brush your teeth and gums at least twice a day and floss at least once a day. Follow up with your dentist regularly.  Share your diabetes management plan with your workplace or school.  Keep your immunizations up to date. It is recommended that you receive a flu (influenza) vaccine every year. It is also recommended that you receive a pneumonia (pneumococcal) vaccine. If you are 85 years of age or older and have never received a pneumonia vaccine, this vaccine may  be given as a series of two separate shots. Ask your health care provider which additional vaccines may be recommended.  Learn to manage stress.  Obtain ongoing diabetes education and support as needed.  Participate in or seek rehabilitation as needed to maintain or improve independence and quality of life. Request a physical or occupational therapy referral if you are having foot or hand numbness, or difficulties with grooming, dressing, eating, or physical activity. SEEK MEDICAL CARE IF:   You are unable to eat food or drink fluids for more than 6 hours.  You have nausea and vomiting for more than 6 hours.  Your blood glucose level is over 240 mg/dL.  There is a change in mental status.  You develop an additional serious illness.  You have diarrhea for more than 6 hours.  You have been sick or have had a fever for a couple of days and are not getting better.  You have pain during any physical activity.  SEEK IMMEDIATE MEDICAL CARE IF:  You have difficulty breathing.  You have moderate to large ketone levels.   This information is not intended to replace advice given to you by your health care provider. Make sure you discuss any questions you have with your health care provider.   Document Released: 07/21/2005 Document Revised: 04/11/2015 Document Reviewed: 02/17/2012 Elsevier Interactive Patient Education 2016 [employer]. Diabetes and Foot Care Diabetes may cause you to have problems because of poor blood supply (circulation) to your feet and legs. This may cause the skin on your feet to become thinner, break easier, and heal more slowly. Your skin may become dry, and the skin may peel and crack. You may also have nerve damage in your legs and feet causing decreased feeling in them. You may not notice minor injuries to your feet that could lead to infections or more serious problems. Taking care of your feet is one of the most important things you can do for yourself.  HOME  CARE INSTRUCTIONS  Wear shoes at all times, even in the house. Do not go barefoot. Bare feet are easily injured.  Check your feet daily for blisters, cuts, and redness. If you cannot see the bottom of your feet, use a mirror or ask someone for help.  Wash your feet with warm water (do not use hot water) and mild soap. Then pat your feet and the areas between your toes until they are completely dry. Do not soak your feet as this can dry your skin.  Apply a moisturizing lotion or petroleum jelly (that does not contain alcohol and is unscented) to the skin on your feet and to dry, brittle toenails. Do not apply lotion between your toes.  Trim your toenails straight across. Do not dig under them or around the cuticle. File the edges of your nails with an emery board or nail file.  Do not cut corns or calluses or try to remove them with medicine.  Wear clean socks or stockings every day. Make sure they are not too tight. Do not wear knee-high stockings since they may decrease blood flow to your legs.  Wear shoes that fit properly and have enough cushioning. To break in new shoes, wear them for just a few hours a day. This prevents you from injuring your feet. Always look in your shoes before you put them on to be sure there are no objects inside.  Do not cross your legs. This may decrease the blood flow to your feet.  If you find a minor scrape, cut, or break in the skin on your feet, keep it and the skin around it clean and dry. These areas may be cleansed with mild soap and water. Do not cleanse the area with peroxide, alcohol, or iodine.  When you remove an adhesive bandage, be sure not to damage the skin around it.  If you have a wound, look at it several times a day to make sure it is healing.  Do not use heating pads or hot water bottles. They may burn your skin. If you have lost feeling in your feet or legs, you may not know it is happening until it is too late.  Make sure your health  care provider performs a complete foot exam at least annually or more often if you have foot problems. Report any cuts, sores, or bruises to your health care provider immediately. SEEK MEDICAL CARE IF:   You have an injury that is not healing.  You have cuts or breaks in the skin.  You have an ingrown nail.  You notice redness on your legs or feet.  You feel burning or tingling in your legs or feet.  You have pain or cramps in your legs and feet.  Your legs or feet are numb.  Your feet always feel cold. SEEK IMMEDIATE MEDICAL CARE IF:   There is increasing redness, swelling, or pain in or around a wound.  There is a red line that goes up your leg.  Pus is coming from a wound.  You develop a fever or as directed by your health care provider.  You notice a bad smell coming from an ulcer or wound.   This information is not intended to replace advice given to you by your health care provider. Make sure you discuss any questions you have with your health care provider.   Document Released: 07/18/2000 Document Revised: 03/23/2013 Document Reviewed: 12/28/2012 Elsevier Interactive Patient Education [employer].

## 2016-06-03 NOTE — Progress Notes (Signed)
Subjective:   Joe Jackson is a 82 y.o. male who presents for Medicare Annual/Subsequent preventive examination.  Review of Systems: NOS   Cardiac Risk Factors include: advanced age (>30men, >81 women);diabetes mellitus;male gender;Other (see comment) (Atrial Fibrillation, CHF, HTN)   Sleep patterns:  Occasionally takes naps during the day and has difficulty going back to sleep when he wakes up during the night.   Home Safety/Smoke alarms: Feels safe at home. Smoke alarms present.   Seat belt safety/Bike Helmet:  Always wears seat belts.    Objective:    Vitals: BP 117/78 (BP Location: Right Arm, Patient Position: Sitting, Cuff Size: Normal)   Pulse 79   Temp 97.7 F (36.5 C) (Oral)   Resp 16   Ht 5\' 6"  (1.676 m)   Wt 179 lb 3.2 oz (81.3 kg)   SpO2 97%   BMI 28.92 kg/m   Body mass index is 28.92 kg/m.  Tobacco History  Smoking Status  . Former Smoker  . Packs/day: 0.80  . Years: 78.00  . Types: Cigarettes  . Quit date: 08/05/2011  Smokeless Tobacco  . Current User  . Types: Chew  . Last attempt to quit: 08/11/2011     Ready to quit: Not Answered Counseling given: No   Past Medical History:  Diagnosis Date  . Aortic aneurysm, abdominal ([place])    f/u by [facility]  . Aortic stenosis    mild to mod by echo 10/2012 (mean gradient 14 mmHg)  . CAD (coronary artery disease)    a. s/p prior MI;  b. 11/1999 Cath: LM nl, LAD 16m, LCX min irregs, OM 100, RCA 80p, 100d, L->L and L->R collats, EF 54%.  . Chronic combined systolic and diastolic CHF (congestive heart failure) ([place])    a. 10/2012 Echo: EF 40-45%, Gr1 dd, postlat AK, mild to mod AS, Triv AI, Mild MR, mildly dil LA.  Marland Kitchen COPD (chronic obstructive pulmonary disease) ([place])   . GERD (gastroesophageal reflux disease)   . Hyperlipidemia   . Hypertension   . Ischemic cardiomyopathy    a. EF 40-45% by echo 10/2012  . PAF (paroxysmal atrial fibrillation) ([place])    a. Dx 10/2012 during hosp for resp failure/copd flare  . Pulmonary  fibrosis ([place])   . Shortness of breath   . Sleep apnea    Past Surgical History:  Procedure Laterality Date  . arm surgery    . BACK SURGERY    . CARDIOVERSION N/A 01/20/2013   Procedure: CARDIOVERSION;  Surgeon: Darlin Coco, MD;  Location: [facility];  Service: Cardiovascular;  Laterality: N/A;  . LEG SURGERY    . NECK SURGERY     Family History  Problem Relation Age of Onset  . Heart disease Mother     Heart Disease before age  34  . Other Mother     Rheumatism  . Heart attack Mother   . Heart attack Father    History  Sexual Activity  . Sexual activity: Not on file    Outpatient Encounter Prescriptions as of 06/03/2016  Medication Sig  . Azelastine HCl 0.15 % SOLN 1 spray each nostril bid  . bisoprolol (ZEBETA) 10 MG tablet TAKE 1 TABLET EVERY DAY  . budesonide-formoterol (SYMBICORT) 160-4.5 MCG/ACT inhaler Inhale 2 puffs into the lungs 2 (two) times daily.  . Cholecalciferol (VITAMIN D) 2000 units CAPS Take 1 capsule by mouth daily.  . furosemide (LASIX) 40 MG tablet TAKE 2 TABLETS EVERY MORNING  AND TAKE 1 TABLET EVERY EVENING  .  ipratropium-albuterol (DUONEB) 0.5-2.5 (3) MG/3ML SOLN Take 3 mLs by nebulization every 6 (six) hours as needed.  Marland Kitchen losartan (COZAAR) 25 MG tablet TAKE 1/2 TABLET EVERY DAY  . pravastatin (PRAVACHOL) 80 MG tablet TAKE 1 TABLET EVERY EVENING  . tiotropium (SPIRIVA HANDIHALER) 18 MCG inhalation capsule Place 1 capsule (18 mcg total) into inhaler and inhale daily.  Marland Kitchen warfarin (COUMADIN) 5 MG tablet TAKE AS DIRECTED.  Marland Kitchen allopurinol (ZYLOPRIM) 100 MG tablet TAKE 1 TABLET (100 MG TOTAL) BY MOUTH DAILY. (Patient not taking: Reported on 06/03/2016)  . calcitRIOL (ROCALTROL) 0.25 MCG capsule Take 1 capsule (0.25 mcg total) by mouth daily. (Patient not taking: Reported on 06/03/2016)  . NONFORMULARY OR COMPOUNDED ITEM Power wheelchair #1  Dx - osteoarthritis, copd, atrial fib, chf (Patient not taking: Reported on 06/03/2016)  . [DISCONTINUED]  azithromycin (ZITHROMAX) 250 MG tablet Use as a zpack (Patient not taking: Reported on 06/03/2016)  . [DISCONTINUED] ergocalciferol (VITAMIN D2) 50000 UNITS capsule Take 1 capsule (50,000 Units total) by mouth once a week. (Patient not taking: Reported on 06/03/2016)  . [DISCONTINUED] predniSONE (DELTASONE) 20 MG tablet Take 1 tablet a day for 5 days (Patient not taking: Reported on 06/03/2016)   No facility-administered encounter medications on file as of 06/03/2016.     Activities of Daily Living In your present state of health, do you have any difficulty performing the following activities: 06/03/2016  Hearing? Y  Vision? N  Difficulty concentrating or making decisions? N  Walking or climbing stairs? Y  Dressing or bathing? N  Doing errands, shopping? N  Preparing Food and eating ? N  Using the Toilet? N  In the past six months, have you accidently leaked urine? Y  Do you have problems with loss of bowel control? N  Managing your Medications? Y  Managing your Finances? Y  Housekeeping or managing your Housekeeping? N  Some recent data might be hidden    Patient Care Team: Ann Held, DO as PCP - General (Family Medicine)    Assessment:  No assessment completed during this visit.  Assessment to be completed on physical exam 07/08/16.    INR completed during visit.  Results: 1.9.  Pt currently taking Warfarin 5 mg tablets--1 tablet by mouth daily.   Per provider, please take 1 1/2 tablet of Coumadin tomorrow then resume 1 tablet daily.  Recheck INR next week.    Exercise Activities and Dietary recommendations Current Exercise Habits: Structured exercise class (Goes dancing twice a week ), Time (Minutes): 20, Frequency (Times/Week): 2, Weekly Exercise (Minutes/Week): 40   Diet:  Regular diet.  Mostly quick frozen meals that can be warmed in microwave.  Drinks Pepsi, water (not too much).  Goals    . Get flu shot today.     . Increase physical activity      Fall  Risk Fall Risk  06/03/2016 02/21/2016 03/03/2014  Falls in the past year? No No No  Risk for fall due to : Impaired balance/gait - -   Depression Screen PHQ 2/9 Scores 06/03/2016 02/21/2016 03/03/2014 04/14/2012  PHQ - 2 Score 0 0 0 0    Cognitive Function MMSE - Mini Mental State Exam 06/03/2016  Orientation to time 5  Orientation to Place 5  Registration 3  Attention/ Calculation 5  Recall 1  Language- name 2 objects 2  Language- repeat 1  Language- follow 3 step command 3  Language- read & follow direction 1  Write a sentence 1  Copy design 1  Total score 28      Immunization History  Administered Date(s) Administered  . Influenza Split 06/10/2011, 04/14/2012  . Influenza Whole 05/29/2008, 05/10/2009, 05/21/2010  . Influenza, High Dose Seasonal PF 06/03/2016  . Influenza,inj,Quad PF,36+ Mos 04/19/2013, 06/05/2014  . Pneumococcal Conjugate-13 07/04/2013  . Pneumococcal Polysaccharide-23 05/21/2010  . Td 03/27/2009  . Tdap 03/03/2014  . Zoster 02/01/2010   Screening Tests Health Maintenance  Topic Date Due  . OPHTHALMOLOGY EXAM  12/17/2013  . HEMOGLOBIN A1C  08/23/2016  . FOOT EXAM  06/03/2017  . TETANUS/TDAP  03/03/2024  . INFLUENZA VACCINE  Completed  . ZOSTAVAX  Completed  . PNA vac Low Risk Adult  Completed      Plan:  We completed your Annual Wellness Exam today.    You received the flu vaccine today.    PT/INR: 1.9.  Per provider, please take 1 1/2 tablet of Coumadin tomorrow then resume 1 tablet daily.  Recheck INR next week.  Daughter informed of the same.    Follow up with Dr. Etter Sjogren as scheduled on Monday, June 09, 2016 at 9:15 am.    You have a physical exam scheduled on Tuesday July 08, 2016 at 3:30 pm .    Take medications as directed by your provider.  Maintain a healthy weight and a trim waistline.  Eat healthy meals and snacks, rich in fruits, vegetables, whole grains and lean proteins.  Aim for 150 minutes of moderate physical  activity each week.  Get seven to nine hours of quality sleep each night.     During the course of the visit the patient was educated and counseled about the following appropriate screening and preventive services:   Vaccines to include Pneumoccal, Influenza, Hepatitis B, Td, Zostavax, HCV  Electrocardiogram  Cardiovascular Disease  Colorectal cancer screening  Diabetes screening  Prostate Cancer Screening  Glaucoma screening  Nutrition counseling   Smoking cessation counseling  Patient Instructions (the written plan) was given to the patient.    Rudene Anda, RN  06/03/2016

## 2016-06-05 NOTE — Progress Notes (Signed)
reviewed

## 2016-06-09 ENCOUNTER — Ambulatory Visit (INDEPENDENT_AMBULATORY_CARE_PROVIDER_SITE_OTHER): Payer: Medicare FFS | Admitting: Family Medicine

## 2016-06-09 ENCOUNTER — Encounter: Payer: Self-pay | Admitting: Family Medicine

## 2016-06-09 VITALS — BP 142/80 | HR 77 | Temp 98.9°F | Resp 16 | Ht 66.0 in | Wt 184.4 lb

## 2016-06-09 DIAGNOSIS — I5042 Chronic combined systolic (congestive) and diastolic (congestive) heart failure: Secondary | ICD-10-CM

## 2016-06-09 DIAGNOSIS — I4891 Unspecified atrial fibrillation: Secondary | ICD-10-CM

## 2016-06-09 DIAGNOSIS — B351 Tinea unguium: Secondary | ICD-10-CM

## 2016-06-09 DIAGNOSIS — J441 Chronic obstructive pulmonary disease with (acute) exacerbation: Secondary | ICD-10-CM | POA: Diagnosis not present

## 2016-06-09 DIAGNOSIS — E785 Hyperlipidemia, unspecified: Secondary | ICD-10-CM | POA: Diagnosis not present

## 2016-06-09 DIAGNOSIS — E1151 Type 2 diabetes mellitus with diabetic peripheral angiopathy without gangrene: Secondary | ICD-10-CM

## 2016-06-09 DIAGNOSIS — J449 Chronic obstructive pulmonary disease, unspecified: Secondary | ICD-10-CM

## 2016-06-09 DIAGNOSIS — I10 Essential (primary) hypertension: Secondary | ICD-10-CM

## 2016-06-09 LAB — POCT INR: INR: 4.1

## 2016-06-09 MED ORDER — IPRATROPIUM-ALBUTEROL 0.5-2.5 (3) MG/3ML IN SOLN
3.0000 mL | Freq: Once | RESPIRATORY_TRACT | Status: AC
  Administered 2016-06-09: 3 mL via RESPIRATORY_TRACT

## 2016-06-09 MED ORDER — TIOTROPIUM BROMIDE MONOHYDRATE 18 MCG IN CAPS: 18.0000 ug | ORAL_CAPSULE | Freq: Every day | RESPIRATORY_TRACT | 5 refills | Status: DC

## 2016-06-09 NOTE — Patient Instructions (Addendum)
INR 4.1---  Hold it today and then resume 1 tab a day--- recheck INR next week    Chronic Obstructive Pulmonary Disease Chronic obstructive pulmonary disease (COPD) is a common lung condition in which airflow from the lungs is limited. COPD is a general term that can be used to describe many different lung problems that limit airflow, including both chronic bronchitis and emphysema. If you have COPD, your lung function will probably never return to normal, but there are measures you can take to improve lung function and make yourself feel better. CAUSES   Smoking (common).  Exposure to secondhand smoke.  Genetic problems.  Chronic inflammatory lung diseases or recurrent infections. SYMPTOMS  Shortness of breath, especially with physical activity.  Deep, persistent (chronic) cough with a large amount of thick mucus.  Wheezing.  Rapid breaths (tachypnea).  Gray or bluish discoloration (cyanosis) of the skin, especially in your fingers, toes, or lips.  Fatigue.  Weight loss.  Frequent infections or episodes when breathing symptoms become much worse (exacerbations).  Chest tightness. DIAGNOSIS Your health care provider will take a medical history and perform a physical examination to diagnose COPD. Additional tests for COPD may include:  Lung (pulmonary) function tests.  Chest X-ray.  CT scan.  Blood tests. TREATMENT  Treatment for COPD may include:  Inhaler and nebulizer medicines. These help manage the symptoms of COPD and make your breathing more comfortable.  Supplemental oxygen. Supplemental oxygen is only helpful if you have a low oxygen level in your blood.  Exercise and physical activity. These are beneficial for nearly all people with COPD.  Lung surgery or transplant.  Nutrition therapy to gain weight, if you are underweight.  Pulmonary rehabilitation. This may involve working with a team of health care providers and specialists, such as respiratory,  occupational, and physical therapists. HOME CARE INSTRUCTIONS  Take all medicines (inhaled or pills) as directed by your health care provider.  Avoid over-the-counter medicines or cough syrups that dry up your airway (such as antihistamines) and slow down the elimination of secretions unless instructed otherwise by your health care provider.  If you are a smoker, the most important thing that you can do is stop smoking. Continuing to smoke will cause further lung damage and breathing trouble. Ask your health care provider for help with quitting smoking. He or she can direct you to community resources or hospitals that provide support.  Avoid exposure to irritants such as smoke, chemicals, and fumes that aggravate your breathing.  Use oxygen therapy and pulmonary rehabilitation if directed by your health care provider. If you require home oxygen therapy, ask your health care provider whether you should purchase a pulse oximeter to measure your oxygen level at home.  Avoid contact with individuals who have a contagious illness.  Avoid extreme temperature and humidity changes.  Eat healthy foods. Eating smaller, more frequent meals and resting before meals may help you maintain your strength.  Stay active, but balance activity with periods of rest. Exercise and physical activity will help you maintain your ability to do things you want to do.  Preventing infection and hospitalization is very important when you have COPD. Make sure to receive all the vaccines your health care provider recommends, especially the pneumococcal and influenza vaccines. Ask your health care provider whether you need a pneumonia vaccine.  Learn and use relaxation techniques to manage stress.  Learn and use controlled breathing techniques as directed by your health care provider. Controlled breathing techniques include:  Pursed lip  breathing. Start by breathing in (inhaling) through your nose for 1 second. Then, purse  your lips as if you were going to whistle and breathe out (exhale) through the pursed lips for 2 seconds.  Diaphragmatic breathing. Start by putting one hand on your abdomen just above your waist. Inhale slowly through your nose. The hand on your abdomen should move out. Then purse your lips and exhale slowly. You should be able to feel the hand on your abdomen moving in as you exhale.  Learn and use controlled coughing to clear mucus from your lungs. Controlled coughing is a series of short, progressive coughs. The steps of controlled coughing are: 1. Lean your head slightly forward. 2. Breathe in deeply using diaphragmatic breathing. 3. Try to hold your breath for 3 seconds. 4. Keep your mouth slightly open while coughing twice. 5. Spit any mucus out into a tissue. 6. Rest and repeat the steps once or twice as needed. SEEK MEDICAL CARE IF:  You are coughing up more mucus than usual.  There is a change in the color or thickness of your mucus.  Your breathing is more labored than usual.  Your breathing is faster than usual. SEEK IMMEDIATE MEDICAL CARE IF:  You have shortness of breath while you are resting.  You have shortness of breath that prevents you from:  Being able to talk.  Performing your usual physical activities.  You have chest pain lasting longer than 5 minutes.  Your skin color is more cyanotic than usual.  You measure low oxygen saturations for longer than 5 minutes with a pulse oximeter. MAKE SURE YOU:  Understand these instructions.  Will watch your condition.  Will get help right away if you are not doing well or get worse.   This information is not intended to replace advice given to you by your health care provider. Make sure you discuss any questions you have with your health care provider.   Document Released: 04/30/2005 Document Revised: 08/11/2014 Document Reviewed: 03/17/2013 Elsevier Interactive Patient Education [employer].

## 2016-06-09 NOTE — Assessment & Plan Note (Signed)
Pt not taking spiriva and has not had f/u with pulm in a while We will get him into pulm and restart spiriva  F/u next week

## 2016-06-09 NOTE — Progress Notes (Signed)
Patient ID: Joe Jackson, male    DOB: 10-18-1929  Age: 82 y.o. MRN: [id number]    Subjective:  Subjective  HPI TRENDELL LEAHY presents for f/u copd, a fib , htn, ckd-- pt seeing cardiology , nephrologyand needs f/u with pulmonary.   He has not had f/u with pulmonary and is not using the spiriva.  He is sob easily.  He still goes dancing every Friday. He is also requesting a referral to a foot dr for thick nails and DM f/u.    Review of Systems  Constitutional: Positive for fatigue. Negative for appetite change, diaphoresis and unexpected weight change.  Eyes: Negative for pain, redness and visual disturbance.  Respiratory: Positive for cough and shortness of breath. Negative for chest tightness and wheezing.   Cardiovascular: Negative for chest pain, palpitations and leg swelling.  Endocrine: Negative for cold intolerance, heat intolerance, polydipsia, polyphagia and polyuria.  Genitourinary: Negative for difficulty urinating, dysuria and frequency.  Neurological: Negative for dizziness, light-headedness, numbness and headaches.    History Past Medical History:  Diagnosis Date  . Aortic aneurysm, abdominal ([place])    f/u by [facility]  . Aortic stenosis    mild to mod by echo 10/2012 (mean gradient 14 mmHg)  . CAD (coronary artery disease)    a. s/p prior MI;  b. 11/1999 Cath: LM nl, LAD 67m, LCX min irregs, OM 100, RCA 80p, 100d, L->L and L->R collats, EF 54%.  . Chronic combined systolic and diastolic CHF (congestive heart failure) ([place])    a. 10/2012 Echo: EF 40-45%, Gr1 dd, postlat AK, mild to mod AS, Triv AI, Mild MR, mildly dil LA.  Marland Kitchen COPD (chronic obstructive pulmonary disease) ([place])   . GERD (gastroesophageal reflux disease)   . Hyperlipidemia   . Hypertension   . Ischemic cardiomyopathy    a. EF 40-45% by echo 10/2012  . PAF (paroxysmal atrial fibrillation) ([place])    a. Dx 10/2012 during hosp for resp failure/copd flare  . Pulmonary fibrosis ([place])   . Shortness of breath   . Sleep  apnea     He has a past surgical history that includes Neck surgery; arm surgery; Back surgery; Leg Surgery; and Cardioversion (N/A, 01/20/2013).   His family history includes Heart attack in his father and mother; Heart disease in his mother; Other in his mother.He reports that he quit smoking about 4 years ago. His smoking use included Cigarettes. He has a 62.40 pack-year smoking history. His smokeless tobacco use includes Chew. He reports that he does not drink alcohol or use drugs.  Current Outpatient Prescriptions on File Prior to Visit  Medication Sig Dispense Refill  . allopurinol (ZYLOPRIM) 100 MG tablet TAKE 1 TABLET (100 MG TOTAL) BY MOUTH DAILY. (Patient not taking: Reported on 06/03/2016) 90 tablet 1  . Azelastine HCl 0.15 % SOLN 1 spray each nostril bid 30 mL 5  . bisoprolol (ZEBETA) 10 MG tablet TAKE 1 TABLET EVERY DAY 90 tablet 3  . budesonide-formoterol (SYMBICORT) 160-4.5 MCG/ACT inhaler Inhale 2 puffs into the lungs 2 (two) times daily. 1 Inhaler 6  . calcitRIOL (ROCALTROL) 0.25 MCG capsule Take 1 capsule (0.25 mcg total) by mouth daily. (Patient not taking: Reported on 06/03/2016) 90 capsule 3  . Cholecalciferol (VITAMIN D) 2000 units CAPS Take 1 capsule by mouth daily.    . furosemide (LASIX) 40 MG tablet TAKE 2 TABLETS EVERY MORNING  AND TAKE 1 TABLET EVERY EVENING 270 tablet 3  . ipratropium-albuterol (DUONEB) 0.5-2.5 (3) MG/3ML SOLN  Take 3 mLs by nebulization every 6 (six) hours as needed. 360 mL 5  . losartan (COZAAR) 25 MG tablet TAKE 1/2 TABLET EVERY DAY 45 tablet 1  . NONFORMULARY OR COMPOUNDED ITEM Power wheelchair #1  Dx - osteoarthritis, copd, atrial fib, chf (Patient not taking: Reported on 06/03/2016) 1 each 0  . pravastatin (PRAVACHOL) 80 MG tablet TAKE 1 TABLET EVERY EVENING 90 tablet 3  . warfarin (COUMADIN) 5 MG tablet TAKE AS DIRECTED. 30 tablet 0   No current facility-administered medications on file prior to visit.      Objective:  Objective  Physical  Exam  Constitutional: He is oriented to person, place, and time. He appears well-developed and well-nourished.  HENT:  Right Ear: External ear normal.  Left Ear: External ear normal.  Eyes: Conjunctivae are normal. Right eye exhibits no discharge. Left eye exhibits no discharge.  Cardiovascular: Normal rate, regular rhythm and normal heart sounds.   No murmur heard. Pulmonary/Chest: Effort normal. No respiratory distress. He has decreased breath sounds. He has wheezes. He has no rales. He exhibits no tenderness.  Musculoskeletal: He exhibits no edema.  Lymphadenopathy:    He has no cervical adenopathy.  Neurological: He is alert and oriented to person, place, and time.  Psychiatric: He has a normal mood and affect. His behavior is normal. Judgment and thought content normal.  Nursing note and vitals reviewed.  BP (!) 142/80 (BP Location: Left Arm, Patient Position: Sitting, Cuff Size: Normal)   Pulse 77   Temp 98.9 F (37.2 C) (Oral)   Resp 16   Ht 5\' 6"  (1.676 m)   Wt 184 lb 6.4 oz (83.6 kg)   SpO2 93%   BMI 29.76 kg/m  Wt Readings from Last 3 Encounters:  06/09/16 184 lb 6.4 oz (83.6 kg)  06/03/16 179 lb 3.2 oz (81.3 kg)  02/21/16 177 lb 9.6 oz (80.6 kg)     Lab Results  Component Value Date   WBC 9.1 02/21/2016   HGB 12.2 (L) 02/21/2016   HCT 38.4 (L) 02/21/2016   PLT 149.0 (L) 02/21/2016   GLUCOSE 88 02/21/2016   CHOL 127 02/21/2016   TRIG 115.0 02/21/2016   HDL 30.30 (L) 02/21/2016   LDLDIRECT 140.4 04/14/2012   LDLCALC 74 02/21/2016   ALT 10 02/21/2016   AST 18 02/21/2016   NA 141 02/21/2016   K 4.1 02/21/2016   CL 101 02/21/2016   CREATININE 1.63 (H) 02/21/2016   BUN 39 (H) 02/21/2016   CO2 33 (H) 02/21/2016   TSH 0.772 11/12/2012   PSA 1.37 04/14/2012   INR 4.1 06/09/2016   HGBA1C 6.2 02/21/2016   MICROALBUR 2.2 (H) 10/30/2014    Dg Foot Complete Right  Result Date: 01/04/2016 CLINICAL DATA:  Dropped probe vein tank on right foot. Pain of the  distal great toe. EXAM: RIGHT FOOT COMPLETE - 3+ VIEW COMPARISON:  None. FINDINGS: There is a crush fracture of the distal phalanx of the great toe affecting the distal tuft in the midportion of the bone. No fracture line extends to the articular surface however. No other bone appears to be affected. There are advanced chronic degenerative changes of the MTP joint of the great toe. IMPRESSION: Crush injury of the distal phalanx of the great toe without involvement of the IP joint Electronically Signed   By: Nelson Chimes M.D.   On: 01/04/2016 16:13     Assessment & Plan:  Plan  I am having Mr. Gaskins maintain his calcitRIOL, Azelastine  HCl, ipratropium-albuterol, NONFORMULARY OR COMPOUNDED ITEM, budesonide-formoterol, allopurinol, furosemide, losartan, pravastatin, bisoprolol, warfarin, Vitamin D, and tiotropium. We administered ipratropium-albuterol.  Meds ordered this encounter  Medications  . tiotropium (SPIRIVA HANDIHALER) 18 MCG inhalation capsule    Sig: Place 1 capsule (18 mcg total) into inhaler and inhale daily.    Dispense:  30 capsule    Refill:  5    Order Specific Question:   Lot Number?    Answer:   [id number]    Order Specific Question:   Expiration Date?    Answer:   09/04/2014    Order Specific Question:   Quantity    Answer:   3    Comments:   samples  . ipratropium-albuterol (DUONEB) 0.5-2.5 (3) MG/3ML nebulizer solution 3 mL    Problem List Items Addressed This Visit      Unprioritized   Atrial fibrillation ([place])    Recheck In'r today Hold coumadin today and restart  Recheck next week      Relevant Medications   ipratropium-albuterol (DUONEB) 0.5-2.5 (3) MG/3ML nebulizer solution 3 mL (Completed)   Other Relevant Orders   POCT INR (Completed)   Chronic combined systolic and diastolic CHF (congestive heart failure) ([place])    Con;t meds per cardiology      COPD (chronic obstructive pulmonary disease) ([place])    Pt not taking spiriva and has not had f/u with pulm in a  while We will get him into pulm and restart spiriva  F/u next week       Relevant Medications   tiotropium (SPIRIVA HANDIHALER) 18 MCG inhalation capsule   ipratropium-albuterol (DUONEB) 0.5-2.5 (3) MG/3ML nebulizer solution 3 mL (Completed)   Hypertension    Stable con't meds       Other Visit Diagnoses    COPD exacerbation ([place])    -  Primary   Relevant Medications   tiotropium (SPIRIVA HANDIHALER) 18 MCG inhalation capsule   ipratropium-albuterol (DUONEB) 0.5-2.5 (3) MG/3ML nebulizer solution 3 mL (Completed)   Other Relevant Orders   Ambulatory referral to Pulmonology   Hyperlipidemia LDL goal <100       Relevant Medications   ipratropium-albuterol (DUONEB) 0.5-2.5 (3) MG/3ML nebulizer solution 3 mL (Completed)   Onychomycosis       Relevant Orders   Ambulatory referral to Podiatry   DM (diabetes mellitus) type II controlled peripheral vascular disorder ([place])       Relevant Orders   Ambulatory referral to Podiatry      Follow-up: Return in about 1 week (around 06/16/2016) for INR.  Ann Held, DO

## 2016-06-09 NOTE — Assessment & Plan Note (Signed)
Stable con't meds 

## 2016-06-09 NOTE — Assessment & Plan Note (Signed)
Con;t meds per cardiology

## 2016-06-09 NOTE — Assessment & Plan Note (Signed)
Recheck In'r today Hold coumadin today and restart  Recheck next week

## 2016-06-09 NOTE — Progress Notes (Signed)
Pre visit review using our clinic review tool, if applicable. No additional management support is needed unless otherwise documented below in the visit note. 

## 2016-06-16 ENCOUNTER — Encounter: Payer: Self-pay | Admitting: Family Medicine

## 2016-06-16 ENCOUNTER — Ambulatory Visit (HOSPITAL_BASED_OUTPATIENT_CLINIC_OR_DEPARTMENT_OTHER)
Admission: RE | Admit: 2016-06-16 | Discharge: 2016-06-16 | Disposition: A | Discharge status: Home / Self Care | Payer: Medicare FFS | Source: Ambulatory Visit | Attending: Family Medicine | Admitting: Family Medicine

## 2016-06-16 ENCOUNTER — Ambulatory Visit (INDEPENDENT_AMBULATORY_CARE_PROVIDER_SITE_OTHER): Payer: Medicare FFS | Admitting: Family Medicine

## 2016-06-16 VITALS — BP 118/78 | HR 76 | Temp 99.1°F | Resp 18 | Ht 66.0 in | Wt 183.0 lb

## 2016-06-16 DIAGNOSIS — I517 Cardiomegaly: Secondary | ICD-10-CM | POA: Diagnosis not present

## 2016-06-16 DIAGNOSIS — I509 Heart failure, unspecified: Secondary | ICD-10-CM

## 2016-06-16 DIAGNOSIS — J441 Chronic obstructive pulmonary disease with (acute) exacerbation: Secondary | ICD-10-CM

## 2016-06-16 DIAGNOSIS — I700 Atherosclerosis of aorta: Secondary | ICD-10-CM | POA: Diagnosis not present

## 2016-06-16 DIAGNOSIS — I4891 Unspecified atrial fibrillation: Secondary | ICD-10-CM

## 2016-06-16 LAB — COMPREHENSIVE METABOLIC PANEL
ALT: 9 U/L (ref 0–53)
AST: 14 U/L (ref 0–37)
Albumin: 3.1 g/dL — ABNORMAL LOW (ref 3.5–5.2)
Alkaline Phosphatase: 125 U/L — ABNORMAL HIGH (ref 39–117)
BILIRUBIN TOTAL: 0.9 mg/dL (ref 0.2–1.2)
BUN: 38 mg/dL — ABNORMAL HIGH (ref 6–23)
CHLORIDE: 101 meq/L (ref 96–112)
CO2: 30 meq/L (ref 19–32)
Calcium: 9.1 mg/dL (ref 8.4–10.5)
Creatinine, Ser: 1.56 mg/dL — ABNORMAL HIGH (ref 0.40–1.50)
GFR: 45.21 mL/min — AB (ref >60.00)
GLUCOSE: 101 mg/dL — AB (ref 70–99)
Potassium: 3.4 mEq/L — ABNORMAL LOW (ref 3.5–5.1)
Sodium: 140 mEq/L (ref 135–145)
Total Protein: 7.2 g/dL (ref 6.0–8.3)

## 2016-06-16 LAB — PROTIME-INR
INR: 2.2 ratio — ABNORMAL HIGH (ref 0.8–1.0)
PROTHROMBIN TIME: 24 s — AB (ref 9.6–13.1)

## 2016-06-16 LAB — CBC WITH DIFFERENTIAL/PLATELET
BASOS ABS: 0 10*3/uL (ref 0.0–0.1)
BASOS PCT: 0.4 % (ref 0.0–3.0)
EOS ABS: 0.2 10*3/uL (ref 0.0–0.7)
Eosinophils Relative: 3 % (ref 0.0–5.0)
HCT: 32.2 % — ABNORMAL LOW (ref 39.0–52.0)
Hemoglobin: 10.2 g/dL — ABNORMAL LOW (ref 13.0–17.0)
LYMPHS ABS: 1.2 10*3/uL (ref 0.7–4.0)
Lymphocytes Relative: 19.7 % (ref 12.0–46.0)
MCHC: 31.8 g/dL (ref 30.0–36.0)
MCV: 81.8 fl (ref 78.0–100.0)
MONOS PCT: 10.8 % (ref 3.0–12.0)
Monocytes Absolute: 0.7 10*3/uL (ref 0.1–1.0)
NEUTROS ABS: 4 10*3/uL (ref 1.4–7.7)
NEUTROS PCT: 66.1 % (ref 43.0–77.0)
PLATELETS: 157 10*3/uL (ref 150.0–400.0)
RBC: 3.93 Mil/uL — ABNORMAL LOW (ref 4.22–5.81)
RDW: 15.5 % (ref 11.5–15.5)
WBC: 6.1 10*3/uL (ref 4.0–10.5)

## 2016-06-16 MED ORDER — IPRATROPIUM-ALBUTEROL 0.5-2.5 (3) MG/3ML IN SOLN
3.0000 mL | Freq: Four times a day (QID) | RESPIRATORY_TRACT | Status: DC
  Administered 2016-06-16: 3 mL via RESPIRATORY_TRACT

## 2016-06-16 MED ORDER — AZITHROMYCIN 250 MG PO TABS: ORAL_TABLET | ORAL | 0 refills | Status: DC

## 2016-06-16 MED ORDER — TIOTROPIUM BROMIDE MONOHYDRATE 18 MCG IN CAPS: 18.0000 ug | ORAL_CAPSULE | Freq: Every day | RESPIRATORY_TRACT | 5 refills | Status: AC

## 2016-06-16 MED FILL — AZITHROMYCIN 250 MG TABLET: 250 | 5 days supply | Qty: 6 | Fill #0

## 2016-06-16 MED FILL — SPIRIVA 18 MCG CP-HANDIHALE: 18 | 30 days supply | Qty: 30 | Fill #0

## 2016-06-16 NOTE — Progress Notes (Signed)
Pre visit review using our clinic review tool, if applicable. No additional management support is needed unless otherwise documented below in the visit note. 

## 2016-06-16 NOTE — Progress Notes (Signed)
Patient ID: Joe Jackson, male    DOB: 1930/06/16  Age: 82 y.o. MRN: [id number]    Subjective:  Subjective  HPI Joe Jackson presents for cough and congestion x 4 days.   +wheezing , fever.  Review of Systems  Constitutional: Positive for chills and fever.  HENT: Positive for congestion. Negative for postnasal drip, rhinorrhea and sinus pressure.   Respiratory: Positive for cough, chest tightness, shortness of breath and wheezing.   Cardiovascular: Negative for chest pain, palpitations and leg swelling.  Allergic/Immunologic: Negative for environmental allergies.    History Past Medical History:  Diagnosis Date  . Aortic aneurysm, abdominal ([place])    f/u by [facility]  . Aortic stenosis    mild to mod by echo 10/2012 (mean gradient 14 mmHg)  . CAD (coronary artery disease)    a. s/p prior MI;  b. 11/1999 Cath: LM nl, LAD 27m, LCX min irregs, OM 100, RCA 80p, 100d, L->L and L->R collats, EF 54%.  . Chronic combined systolic and diastolic CHF (congestive heart failure) ([place])    a. 10/2012 Echo: EF 40-45%, Gr1 dd, postlat AK, mild to mod AS, Triv AI, Mild MR, mildly dil LA.  Marland Kitchen COPD (chronic obstructive pulmonary disease) ([place])   . GERD (gastroesophageal reflux disease)   . Hyperlipidemia   . Hypertension   . Ischemic cardiomyopathy    a. EF 40-45% by echo 10/2012  . PAF (paroxysmal atrial fibrillation) ([place])    a. Dx 10/2012 during hosp for resp failure/copd flare  . Pulmonary fibrosis ([place])   . Shortness of breath   . Sleep apnea     He has a past surgical history that includes Neck surgery; arm surgery; Back surgery; Leg Surgery; and Cardioversion (N/A, 01/20/2013).   His family history includes Heart attack in his father and mother; Heart disease in his mother; Other in his mother.He reports that he quit smoking about 4 years ago. His smoking use included Cigarettes. He has a 62.40 pack-year smoking history. His smokeless tobacco use includes Chew. He reports that he does not drink  alcohol or use drugs.  Current Outpatient Prescriptions on File Prior to Visit  Medication Sig Dispense Refill  . allopurinol (ZYLOPRIM) 100 MG tablet TAKE 1 TABLET (100 MG TOTAL) BY MOUTH DAILY. 90 tablet 1  . Azelastine HCl 0.15 % SOLN 1 spray each nostril bid 30 mL 5  . bisoprolol (ZEBETA) 10 MG tablet TAKE 1 TABLET EVERY DAY 90 tablet 3  . budesonide-formoterol (SYMBICORT) 160-4.5 MCG/ACT inhaler Inhale 2 puffs into the lungs 2 (two) times daily. 1 Inhaler 6  . calcitRIOL (ROCALTROL) 0.25 MCG capsule Take 1 capsule (0.25 mcg total) by mouth daily. 90 capsule 3  . Cholecalciferol (VITAMIN D) 2000 units CAPS Take 1 capsule by mouth daily.    . furosemide (LASIX) 40 MG tablet TAKE 2 TABLETS EVERY MORNING  AND TAKE 1 TABLET EVERY EVENING 270 tablet 3  . ipratropium-albuterol (DUONEB) 0.5-2.5 (3) MG/3ML SOLN Take 3 mLs by nebulization every 6 (six) hours as needed. 360 mL 5  . losartan (COZAAR) 25 MG tablet TAKE 1/2 TABLET EVERY DAY 45 tablet 1  . NONFORMULARY OR COMPOUNDED ITEM Power wheelchair #1  Dx - osteoarthritis, copd, atrial fib, chf 1 each 0  . pravastatin (PRAVACHOL) 80 MG tablet TAKE 1 TABLET EVERY EVENING 90 tablet 3  . warfarin (COUMADIN) 5 MG tablet TAKE AS DIRECTED. 30 tablet 0   No current facility-administered medications on file prior to visit.  Objective:  Objective  Physical Exam  Constitutional: He is oriented to person, place, and time. Vital signs are normal. He appears well-developed and well-nourished. He is sleeping.  HENT:  Head: Normocephalic and atraumatic.  Mouth/Throat: Oropharynx is clear and moist.  Eyes: EOM are normal. Pupils are equal, round, and reactive to light.  Neck: Normal range of motion. Neck supple. No thyromegaly present.  Cardiovascular: Normal rate and regular rhythm.   Murmur heard. Pulmonary/Chest: Effort normal. No respiratory distress. He has wheezes. He has rales.     He exhibits no tenderness.  Musculoskeletal: He exhibits  no edema or tenderness.  Neurological: He is alert and oriented to person, place, and time.  Skin: Skin is warm and dry.  Psychiatric: He has a normal mood and affect. His behavior is normal. Judgment and thought content normal.  Nursing note and vitals reviewed.  BP 118/78 (BP Location: Left Arm, Patient Position: Sitting, Cuff Size: Normal)   Pulse 76   Temp 99.1 F (37.3 C) (Oral)   Resp 18   Ht 5\' 6"  (1.676 m)   Wt 183 lb (83 kg)   SpO2 90%   BMI 29.54 kg/m  Wt Readings from Last 3 Encounters:  06/16/16 183 lb (83 kg)  06/09/16 184 lb 6.4 oz (83.6 kg)  06/03/16 179 lb 3.2 oz (81.3 kg)     Lab Results  Component Value Date   WBC 6.1 06/16/2016   HGB 10.2 (L) 06/16/2016   HCT 32.2 (L) 06/16/2016   PLT 157.0 06/16/2016   GLUCOSE 101 (H) 06/16/2016   CHOL 127 02/21/2016   TRIG 115.0 02/21/2016   HDL 30.30 (L) 02/21/2016   LDLDIRECT 140.4 04/14/2012   LDLCALC 74 02/21/2016   ALT 9 06/16/2016   AST 14 06/16/2016   NA 140 06/16/2016   K 3.4 (L) 06/16/2016   CL 101 06/16/2016   CREATININE 1.56 (H) 06/16/2016   BUN 38 (H) 06/16/2016   CO2 30 06/16/2016   TSH 0.772 11/12/2012   PSA 1.37 04/14/2012   INR 2.2 (H) 06/16/2016   HGBA1C 6.2 02/21/2016   MICROALBUR 2.2 (H) 10/30/2014    Dg Foot Complete Right  Result Date: 01/04/2016 CLINICAL DATA:  Dropped probe vein tank on right foot. Pain of the distal great toe. EXAM: RIGHT FOOT COMPLETE - 3+ VIEW COMPARISON:  None. FINDINGS: There is a crush fracture of the distal phalanx of the great toe affecting the distal tuft in the midportion of the bone. No fracture line extends to the articular surface however. No other bone appears to be affected. There are advanced chronic degenerative changes of the MTP joint of the great toe. IMPRESSION: Crush injury of the distal phalanx of the great toe without involvement of the IP joint Electronically Signed   By: Nelson Chimes M.D.   On: 01/04/2016 16:13     Assessment & Plan:  Plan  I  am having Mr. Hosek start on azithromycin. I am also having him maintain his calcitRIOL, Azelastine HCl, ipratropium-albuterol, NONFORMULARY OR COMPOUNDED ITEM, budesonide-formoterol, allopurinol, furosemide, losartan, pravastatin, bisoprolol, warfarin, Vitamin D, and tiotropium. We administered ipratropium-albuterol. We will continue to administer ipratropium-albuterol.  Meds ordered this encounter  Medications  . ipratropium-albuterol (DUONEB) 0.5-2.5 (3) MG/3ML nebulizer solution 3 mL  . azithromycin (ZITHROMAX) 250 MG tablet    Sig: As directed    Dispense:  6 each    Refill:  0  . tiotropium (SPIRIVA HANDIHALER) 18 MCG inhalation capsule    Sig: Place 1 capsule (  18 mcg total) into inhaler and inhale daily.    Dispense:  30 capsule    Refill:  5    Order Specific Question:   Lot Number?    Answer:   [id number]    Order Specific Question:   Expiration Date?    Answer:   09/04/2014    Order Specific Question:   Quantity    Answer:   3    Comments:   samples    Problem List Items Addressed This Visit      Unprioritized   CHF (congestive heart failure) ([place])   Relevant Orders   Ambulatory referral to Cardiology   Atrial fibrillation [facility])   Relevant Orders   Ambulatory referral to Cardiology   Protime-INR (Completed)    Other Visit Diagnoses    COPD exacerbation ([place])    -  Primary   Relevant Medications   ipratropium-albuterol (DUONEB) 0.5-2.5 (3) MG/3ML nebulizer solution 3 mL   azithromycin (ZITHROMAX) 250 MG tablet   tiotropium (SPIRIVA HANDIHALER) 18 MCG inhalation capsule   Other Relevant Orders   Ambulatory referral to [place]   DG Chest 2 View (Completed)   CBC with Differential/Platelet (Completed)   Comprehensive metabolic panel (Completed)   Ambulatory referral to Pulmonology    if symptoms worsen-- go to hosp   Follow-up: Return in about 1 week (around 06/23/2016), or if symptoms worsen or fail to improve, for pt check.  Ann Held, DO

## 2016-06-16 NOTE — Patient Instructions (Signed)
Chronic Obstructive Pulmonary Disease Chronic obstructive pulmonary disease (COPD) is a common lung condition in which airflow from the lungs is limited. COPD is a general term that can be used to describe many different lung problems that limit airflow, including both chronic bronchitis and emphysema. If you have COPD, your lung function will probably never return to normal, but there are measures you can take to improve lung function and make yourself feel better. CAUSES   Smoking (common).  Exposure to secondhand smoke.  Genetic problems.  Chronic inflammatory lung diseases or recurrent infections. SYMPTOMS  Shortness of breath, especially with physical activity.  Deep, persistent (chronic) cough with a large amount of thick mucus.  Wheezing.  Rapid breaths (tachypnea).  Gray or bluish discoloration (cyanosis) of the skin, especially in your fingers, toes, or lips.  Fatigue.  Weight loss.  Frequent infections or episodes when breathing symptoms become much worse (exacerbations).  Chest tightness. DIAGNOSIS Your health care provider will take a medical history and perform a physical examination to diagnose COPD. Additional tests for COPD may include:  Lung (pulmonary) function tests.  Chest X-ray.  CT scan.  Blood tests. TREATMENT  Treatment for COPD may include:  Inhaler and nebulizer medicines. These help manage the symptoms of COPD and make your breathing more comfortable.  Supplemental oxygen. Supplemental oxygen is only helpful if you have a low oxygen level in your blood.  Exercise and physical activity. These are beneficial for nearly all people with COPD.  Lung surgery or transplant.  Nutrition therapy to gain weight, if you are underweight.  Pulmonary rehabilitation. This may involve working with a team of health care providers and specialists, such as respiratory, occupational, and physical therapists. HOME CARE INSTRUCTIONS  Take all medicines  (inhaled or pills) as directed by your health care provider.  Avoid over-the-counter medicines or cough syrups that dry up your airway (such as antihistamines) and slow down the elimination of secretions unless instructed otherwise by your health care provider.  If you are a smoker, the most important thing that you can do is stop smoking. Continuing to smoke will cause further lung damage and breathing trouble. Ask your health care provider for help with quitting smoking. He or she can direct you to community resources or hospitals that provide support.  Avoid exposure to irritants such as smoke, chemicals, and fumes that aggravate your breathing.  Use oxygen therapy and pulmonary rehabilitation if directed by your health care provider. If you require home oxygen therapy, ask your health care provider whether you should purchase a pulse oximeter to measure your oxygen level at home.  Avoid contact with individuals who have a contagious illness.  Avoid extreme temperature and humidity changes.  Eat healthy foods. Eating smaller, more frequent meals and resting before meals may help you maintain your strength.  Stay active, but balance activity with periods of rest. Exercise and physical activity will help you maintain your ability to do things you want to do.  Preventing infection and hospitalization is very important when you have COPD. Make sure to receive all the vaccines your health care provider recommends, especially the pneumococcal and influenza vaccines. Ask your health care provider whether you need a pneumonia vaccine.  Learn and use relaxation techniques to manage stress.  Learn and use controlled breathing techniques as directed by your health care provider. Controlled breathing techniques include:  Pursed lip breathing. Start by breathing in (inhaling) through your nose for 1 second. Then, purse your lips as if you were   going to whistle and breathe out (exhale) through the  pursed lips for 2 seconds.  Diaphragmatic breathing. Start by putting one hand on your abdomen just above your waist. Inhale slowly through your nose. The hand on your abdomen should move out. Then purse your lips and exhale slowly. You should be able to feel the hand on your abdomen moving in as you exhale.  Learn and use controlled coughing to clear mucus from your lungs. Controlled coughing is a series of short, progressive coughs. The steps of controlled coughing are: 1. Lean your head slightly forward. 2. Breathe in deeply using diaphragmatic breathing. 3. Try to hold your breath for 3 seconds. 4. Keep your mouth slightly open while coughing twice. 5. Spit any mucus out into a tissue. 6. Rest and repeat the steps once or twice as needed. SEEK MEDICAL CARE IF:  You are coughing up more mucus than usual.  There is a change in the color or thickness of your mucus.  Your breathing is more labored than usual.  Your breathing is faster than usual. SEEK IMMEDIATE MEDICAL CARE IF:  You have shortness of breath while you are resting.  You have shortness of breath that prevents you from:  Being able to talk.  Performing your usual physical activities.  You have chest pain lasting longer than 5 minutes.  Your skin color is more cyanotic than usual.  You measure low oxygen saturations for longer than 5 minutes with a pulse oximeter. MAKE SURE YOU:  Understand these instructions.  Will watch your condition.  Will get help right away if you are not doing well or get worse.   This information is not intended to replace advice given to you by your health care provider. Make sure you discuss any questions you have with your health care provider.   Document Released: 04/30/2005 Document Revised: 08/11/2014 Document Reviewed: 03/17/2013 Elsevier Interactive Patient Education 2016 Elsevier Inc.  

## 2016-06-17 ENCOUNTER — Ambulatory Visit: Payer: Medicare FFS

## 2016-06-17 ENCOUNTER — Ambulatory Visit: Payer: Medicare FFS | Admitting: Podiatry

## 2016-06-19 ENCOUNTER — Other Ambulatory Visit: Payer: Self-pay

## 2016-06-19 ENCOUNTER — Telehealth: Payer: Self-pay | Admitting: Family Medicine

## 2016-06-19 MED ORDER — LEVOFLOXACIN 500 MG PO TABS: 500.0000 mg | ORAL_TABLET | Freq: Every day | ORAL | 0 refills | Status: DC

## 2016-06-19 MED FILL — levoFLOXacin 500 MG TABS: 500 | 10 days supply | Qty: 10 | Fill #0

## 2016-06-19 NOTE — Telephone Encounter (Signed)
Caller name:Anna Nilan Relationship to patient:daughter Can be reached:[phone] Pharmacy:  Reason for call:Returning call

## 2016-06-19 NOTE — Telephone Encounter (Signed)
Left message on pt's daughter vm to give the office a call back. Left a detailed message stating her father may have pneumonia and to take the abx per PCP, f/u 3 weeks or sooner. LB

## 2016-06-19 NOTE — Telephone Encounter (Signed)
Spoke with pt's daughter, she states her father is not getting better on current abx and wondering if PCP can prescribe a stronger abx until follow up appointment Monday morning. LB

## 2016-06-19 NOTE — Progress Notes (Signed)
Spoke with pt's daughter and she request for a stronger abx due her father not getting better and she feel the abx that he is currently taking is not working for his pneumonia. She states her father has an appointment with PCP Monday. I advise pt she should take her father to ER is he is getting worse, she replied that she is taking care of her grandson and won't be able to take her father to the ER she just feel he needs a stronger abx. Rx for abx is put in per pt's daughter request. Continued to advise pt to take her father to Er. LB

## 2016-06-23 ENCOUNTER — Telehealth: Payer: Self-pay | Admitting: Family Medicine

## 2016-06-23 ENCOUNTER — Ambulatory Visit: Payer: Medicare FFS | Admitting: Family Medicine

## 2016-06-23 NOTE — Telephone Encounter (Signed)
Daughter lvm 06/23/16 at 7:22am cancelling patient 9am appointment due to patient no feeling better, charge or no charge

## 2016-06-23 NOTE — Telephone Encounter (Signed)
No charge. 

## 2016-06-23 NOTE — Telephone Encounter (Signed)
Spoke with patient he stated that his daughter had another appointment that's why she could not bring him here. He stated that he did not know he had an appt. He stated that he is getting better and will continue to take his meds as perscribed. PC

## 2016-06-23 NOTE — Telephone Encounter (Signed)
That is the reason the pt needed to come in--- he needs to go to the ER most likely

## 2016-06-24 ENCOUNTER — Telehealth: Payer: Self-pay | Admitting: Family Medicine

## 2016-06-24 NOTE — Telephone Encounter (Signed)
Estill Bamberg - RN - [facility] Means [phone]   Called in to make provider aware of pt's BP. Sitting it is 122/64 and standing it is 84/48.   She would like to be advised.     ALSO, she need orders for point of care when call back.

## 2016-06-24 NOTE — Telephone Encounter (Signed)
Joe Jackson, home health RN. She states that the pt is refusing ED, but daughter agrees w/ ED eval. This is what the daughter told me as well.   Joe Jackson has concerns that the pt is not safe at home due to cognitive issues. He is disoriented to time, unsteady on his feet, and was preoccupied with the phone during her assessment. However, the pt is adamant about staying at home and maintaining his independence. Amanda recommended PT eval to daughter, and she refused.   PE per Joe Jackson: O2 sat 90% on RA (same as last OV), lungs CTA, no wheezing.   Joe Jackson requests verbal orders for social work to look into possible benefits available through the [place] and for nursing 2x weekly for 2 weeks, then 1x weekly for 1 week. Okay per Dr. Carollee Herter, verbal order given as directed.   Returned call to Joe Jackson, pt's daughter, and again advised ED eval. Joe Jackson stated that she agrees and has already called her sister and they are planning to take pt to [facility] ED now. States he should be there by 5:30pm.

## 2016-06-24 NOTE — Telephone Encounter (Signed)
Spoke w/ pt's daughter regarding lab results. States pt was dizzy and lightheaded when orthostatics were obtained. Advised her to instruct pt to move slowly from sitting to standing position.   ED eval advised multiple times, Vicente Males states pt refuses to go to ED. Acute appt scheduled w/ Dr. Nani Ravens tomorrow at 2pm per Anna's request. Declined same day appt w/ PCP. Emphasized the importance of keeping this appt, as pt no showed for appt yesterday and instructed her to seek immediate care for pt if his symptoms worsen prior to appt tomorrow and she verbalized understanding.  Please advise if any additional recommendations for [facility] RN.

## 2016-06-24 NOTE — Telephone Encounter (Signed)
Pt is orthostatic,dehydrated--- is pt refusing per [facility] or is family refusing?   Pt needs IVF and probably IV abx ----- needs to go to ER Have they checked O2 sat?

## 2016-06-24 NOTE — Telephone Encounter (Signed)
Great thank you!

## 2016-06-25 ENCOUNTER — Ambulatory Visit: Payer: Medicare FFS | Admitting: Family Medicine

## 2016-06-30 ENCOUNTER — Other Ambulatory Visit: Payer: Self-pay

## 2016-06-30 ENCOUNTER — Telehealth: Payer: Self-pay | Admitting: Family Medicine

## 2016-06-30 DIAGNOSIS — N183 Chronic kidney disease, stage 3 unspecified: Secondary | ICD-10-CM

## 2016-06-30 NOTE — Telephone Encounter (Signed)
Verbal orders where giving to [facility] to resume care for patient.   PC

## 2016-06-30 NOTE — Progress Notes (Signed)
Referral for patient has been placed for patient. PC

## 2016-06-30 NOTE — Telephone Encounter (Signed)
[place] Relation to pt: Call back [place]:  Reason for call: pt is needing nursing orders a resumption of care, verbal is ok

## 2016-07-01 ENCOUNTER — Ambulatory Visit (INDEPENDENT_AMBULATORY_CARE_PROVIDER_SITE_OTHER): Payer: Medicare FFS | Admitting: Podiatry

## 2016-07-01 ENCOUNTER — Encounter: Payer: Self-pay | Admitting: Podiatry

## 2016-07-01 DIAGNOSIS — B351 Tinea unguium: Secondary | ICD-10-CM | POA: Diagnosis not present

## 2016-07-01 DIAGNOSIS — M79675 Pain in left toe(s): Secondary | ICD-10-CM

## 2016-07-01 DIAGNOSIS — M79674 Pain in right toe(s): Secondary | ICD-10-CM | POA: Diagnosis not present

## 2016-07-01 DIAGNOSIS — I8393 Asymptomatic varicose veins of bilateral lower extremities: Secondary | ICD-10-CM | POA: Diagnosis not present

## 2016-07-01 NOTE — Progress Notes (Signed)
   Subjective:    Patient ID: Joe Jackson, male    DOB: 1929-08-24, 82 y.o.   MRN: [id number]  HPI    Review of Systems     Objective:   Physical Exam        Assessment & Plan:

## 2016-07-01 NOTE — Progress Notes (Signed)
   Subjective:    Patient ID: Joe Jackson, male    DOB: 1929/09/23, 82 y.o.   MRN: [id number]  HPI  82 year old male presents the office if her concerns of thick, painful, elongated toenails that he cannot trim himself. If he has toenail fungus. His been ongoing for several years. The right big toenail that follow-up is started to grow back in. Denies any redness or drainage race plates and the toenail sites.  He also has significant varicose veins and he did see a vein specialist sometime ago in the blood was "drained" from the veins on the left side. He wears compression socks intermittently.  He has no other complaints today.   Review of Systems  All other systems reviewed and are negative.      Objective:   Physical Exam General: AAO x3, NAD  Dermatological: Nails are hypertrophic, dystrophic, brittle, discolored, elongated 9. The right hallux toenail has previously fallen off and started to grow back in. There is a small scab on the nail bed site. There is no  surrounding redness or drainage. Tenderness nails 1-5 bilaterally except for the right hallux toenail. No open lesions or pre-ulcerative lesions are identified today.  Vascular: Dorsalis Pedis artery and Posterior Tibial artery pedal pulses are 1/4 bilateral with immedate capillary fill time. P significant varicose veins are present with a right side worse and left. There is no pain with calf compression, swelling, warmth, erythema.   Neruologic: Sensation intact with Derrel Nip monofilament.  Musculoskeletal: No gross boney pedal deformities bilateral. No pain, crepitus, or limitation noted with foot and ankle range of motion bilateral. Muscular strength 5/5 in all groups tested bilateral.  Gait: Unassisted, Nonantalgic.      Assessment & Plan:  82 year old male with symptomatic onychomycosis; significant varicose veins -Treatment options discussed including all alternatives, risks, and complications -Etiology of  symptoms were discussed -Nails debrided 9 without complications or bleeding. Discussed the treatment option onychomycosis. Can other medications if he desires treatment will proceed with topical treatment. I ordered compound cream [facility].  -Daily foot inspection -He is unsure of who he saw previously for the veins. Will place a consult for vein and vascular today.  -Follow-up in 3 months or sooner if any problems arise. In the meantime, encouraged to call the office with any questions, concerns, change in symptoms.   *After the patient left the patient's daughter was in the waiting room and was upset wanting to know what his treatment was today. The patients daughter did not accompany the patient in the treatment room during the visit. [place] Mon discussed with her the treatment for nail fungus and regular debridements. She states they already have a vascular doctor and encouraged them to make a follow-up  Celesta Gentile, DPM

## 2016-07-02 ENCOUNTER — Telehealth: Payer: Self-pay | Admitting: *Deleted

## 2016-07-02 DIAGNOSIS — I872 Venous insufficiency (chronic) (peripheral): Secondary | ICD-10-CM

## 2016-07-02 NOTE — Telephone Encounter (Addendum)
-----   Message from Trula Slade, DPM sent at 07/01/2016 10:26 AM EST ----- Can you please put in for a vein and vascular consult for him? He has bad varicose veins and causing pain. He had them "drained" before. 07/02/2016-Left message informing pt and pt's Dtr, Vicente Males that Dr. Jacqualyn Posey was referring to [place] [phone] for pt's varicose veins. Consultation referral, pt demographics, and clinicals faxed to [facility].

## 2016-07-04 ENCOUNTER — Telehealth: Payer: Self-pay | Admitting: Family Medicine

## 2016-07-04 NOTE — Telephone Encounter (Signed)
Spoke with Joe Jackson for Joe Jackson to give verbal orders for 1 wk, 1 and 2 week 2 for nursing for pneumonia management. Admanda states she will send orders in about a week via fax for provider signature. LB

## 2016-07-04 NOTE — Telephone Encounter (Signed)
Estill Bamberg from [facility] called stating that they are resuming care since patient has been released from the hospital. They are requesting verbal order for 1 week 1 and 2 week 2 for nursing for pneumonia management. Please advise  Estill Bamberg phone: [phone]

## 2016-07-08 ENCOUNTER — Encounter: Payer: Self-pay | Admitting: Family Medicine

## 2016-07-08 ENCOUNTER — Telehealth: Payer: Self-pay

## 2016-07-08 ENCOUNTER — Ambulatory Visit (INDEPENDENT_AMBULATORY_CARE_PROVIDER_SITE_OTHER): Payer: Medicare FFS | Admitting: Family Medicine

## 2016-07-08 ENCOUNTER — Other Ambulatory Visit: Payer: Self-pay | Admitting: Family Medicine

## 2016-07-08 VITALS — BP 92/60 | HR 71 | Temp 98.6°F | Resp 16 | Ht 66.0 in | Wt 179.2 lb

## 2016-07-08 DIAGNOSIS — I4891 Unspecified atrial fibrillation: Secondary | ICD-10-CM | POA: Diagnosis not present

## 2016-07-08 DIAGNOSIS — R351 Nocturia: Secondary | ICD-10-CM

## 2016-07-08 DIAGNOSIS — H538 Other visual disturbances: Secondary | ICD-10-CM

## 2016-07-08 DIAGNOSIS — R319 Hematuria, unspecified: Secondary | ICD-10-CM

## 2016-07-08 DIAGNOSIS — J449 Chronic obstructive pulmonary disease, unspecified: Secondary | ICD-10-CM | POA: Diagnosis not present

## 2016-07-08 DIAGNOSIS — Z23 Encounter for immunization: Secondary | ICD-10-CM

## 2016-07-08 DIAGNOSIS — M79662 Pain in left lower leg: Secondary | ICD-10-CM

## 2016-07-08 DIAGNOSIS — E785 Hyperlipidemia, unspecified: Secondary | ICD-10-CM | POA: Diagnosis not present

## 2016-07-08 DIAGNOSIS — I10 Essential (primary) hypertension: Secondary | ICD-10-CM | POA: Diagnosis not present

## 2016-07-08 DIAGNOSIS — I70213 Atherosclerosis of native arteries of extremities with intermittent claudication, bilateral legs: Secondary | ICD-10-CM

## 2016-07-08 DIAGNOSIS — I8393 Asymptomatic varicose veins of bilateral lower extremities: Secondary | ICD-10-CM

## 2016-07-08 DIAGNOSIS — M79661 Pain in right lower leg: Secondary | ICD-10-CM

## 2016-07-08 DIAGNOSIS — I481 Persistent atrial fibrillation: Secondary | ICD-10-CM

## 2016-07-08 DIAGNOSIS — I4819 Other persistent atrial fibrillation: Secondary | ICD-10-CM

## 2016-07-08 LAB — POCT URINALYSIS DIPSTICK
Bilirubin, UA: NEGATIVE
Glucose, UA: NEGATIVE
KETONES UA: NEGATIVE
LEUKOCYTES UA: NEGATIVE
NITRITE UA: NEGATIVE
PH UA: 6
PROTEIN UA: NEGATIVE
Spec Grav, UA: 1.025
Urobilinogen, UA: 0.2

## 2016-07-08 NOTE — Assessment & Plan Note (Signed)
Per cardiology On coumadin

## 2016-07-08 NOTE — Assessment & Plan Note (Signed)
Stable con't losartan 

## 2016-07-08 NOTE — Progress Notes (Signed)
Patient ID: Joe Jackson, male    DOB: Mar 26, 1930  Age: 82 y.o. MRN: [id number]    Subjective:  Subjective  HPI FALLON SALAIZ presents for cpe -- pt c/o L foot pain and black and blue-- unsure of how it happened Pneumonia symptoms have resolved Pt saw podiatrist-- appointments with cardiology, nephrology And vascular are pending Review of Systems  Constitutional: Negative.   HENT: Negative for congestion, ear pain, hearing loss, nosebleeds, postnasal drip, rhinorrhea, sinus pressure, sneezing and tinnitus.   Eyes: Negative for photophobia, discharge, itching and visual disturbance.  Respiratory: Negative.   Cardiovascular: Negative.   Gastrointestinal: Negative for abdominal distention, abdominal pain, anal bleeding, blood in stool and constipation.  Endocrine: Negative.   Genitourinary: Negative.   Musculoskeletal: Negative.   Skin: Negative.   Allergic/Immunologic: Negative.   Neurological: Negative for dizziness, weakness, light-headedness, numbness and headaches.  Psychiatric/Behavioral: Negative for agitation, confusion, decreased concentration, dysphoric mood, sleep disturbance and suicidal ideas. The patient is not nervous/anxious.     History Past Medical History:  Diagnosis Date  . Aortic aneurysm, abdominal ([place])    f/u by [facility]  . Aortic stenosis    mild to mod by echo 10/2012 (mean gradient 14 mmHg)  . CAD (coronary artery disease)    a. s/p prior MI;  b. 11/1999 Cath: LM nl, LAD 44m, LCX min irregs, OM 100, RCA 80p, 100d, L->L and L->R collats, EF 54%.  . Chronic combined systolic and diastolic CHF (congestive heart failure) ([place])    a. 10/2012 Echo: EF 40-45%, Gr1 dd, postlat AK, mild to mod AS, Triv AI, Mild MR, mildly dil LA.  Marland Kitchen COPD (chronic obstructive pulmonary disease) ([place])   . GERD (gastroesophageal reflux disease)   . Hyperlipidemia   . Hypertension   . Ischemic cardiomyopathy    a. EF 40-45% by echo 10/2012  . PAF (paroxysmal atrial fibrillation) ([place])    a. Dx 10/2012 during hosp for resp failure/copd flare  . Pulmonary fibrosis ([place])   . Shortness of breath   . Sleep apnea     He has a past surgical history that includes Neck surgery; arm surgery; Back surgery; Leg Surgery; and Cardioversion (N/A, 01/20/2013).   His family history includes Heart attack in his father and mother; Heart disease in his mother; Other in his mother.He reports that he quit smoking about 4 years ago. His smoking use included Cigarettes. He has a 62.40 pack-year smoking history. His smokeless tobacco use includes Chew. He reports that he does not drink alcohol or use drugs.  Current Outpatient Prescriptions on File Prior to Visit  Medication Sig Dispense Refill  . allopurinol (ZYLOPRIM) 100 MG tablet TAKE 1 TABLET (100 MG TOTAL) BY MOUTH DAILY. 90 tablet 1  . Azelastine HCl 0.15 % SOLN 1 spray each nostril bid 30 mL 5  . azithromycin (ZITHROMAX) 250 MG tablet As directed 6 each 0  . bisoprolol (ZEBETA) 10 MG tablet TAKE 1 TABLET EVERY DAY 90 tablet 3  . budesonide-formoterol (SYMBICORT) 160-4.5 MCG/ACT inhaler Inhale 2 puffs into the lungs 2 (two) times daily. 1 Inhaler 6  . calcitRIOL (ROCALTROL) 0.25 MCG capsule Take 1 capsule (0.25 mcg total) by mouth daily. 90 capsule 3  . Cholecalciferol (VITAMIN D) 2000 units CAPS Take 1 capsule by mouth daily.    . furosemide (LASIX) 40 MG tablet TAKE 2 TABLETS EVERY MORNING  AND TAKE 1 TABLET EVERY EVENING 270 tablet 3  . ipratropium-albuterol (DUONEB) 0.5-2.5 (3) MG/3ML SOLN Take 3 mLs by  nebulization every 6 (six) hours as needed. 360 mL 5  . levofloxacin (LEVAQUIN) 500 MG tablet Take 1 tablet (500 mg total) by mouth daily. 10 tablet 0  . losartan (COZAAR) 25 MG tablet TAKE 1/2 TABLET EVERY DAY 45 tablet 1  . NONFORMULARY OR COMPOUNDED ITEM Power wheelchair #1  Dx - osteoarthritis, copd, atrial fib, chf 1 each 0  . pravastatin (PRAVACHOL) 80 MG tablet TAKE 1 TABLET EVERY EVENING 90 tablet 3  . tiotropium (SPIRIVA HANDIHALER)  18 MCG inhalation capsule Place 1 capsule (18 mcg total) into inhaler and inhale daily. 30 capsule 5  . warfarin (COUMADIN) 5 MG tablet TAKE AS DIRECTED. 30 tablet 0   Current Facility-Administered Medications on File Prior to Visit  Medication Dose Route Frequency Provider Last Rate Last Dose  . ipratropium-albuterol (DUONEB) 0.5-2.5 (3) MG/3ML nebulizer solution 3 mL  3 mL Nebulization Q6H Anahis Furgeson R Lowne Chase, DO   3 mL at 06/16/16 1018     Objective:  Objective  Physical Exam  Constitutional: He is oriented to person, place, and time. Vital signs are normal. He appears well-developed and well-nourished. He is sleeping.  HENT:  Head: Normocephalic and atraumatic.  Mouth/Throat: Oropharynx is clear and moist.  Eyes: EOM are normal. Pupils are equal, round, and reactive to light.  Neck: Normal range of motion. Neck supple. No thyromegaly present.  Cardiovascular: Normal rate and regular rhythm.   No murmur heard. Pulmonary/Chest: Effort normal and breath sounds normal. No respiratory distress. He has no wheezes. He has no rales. He exhibits no tenderness.  Musculoskeletal: He exhibits tenderness. He exhibits no edema.       Arms: Neurological: He is alert and oriented to person, place, and time.  Skin: Skin is warm and dry.  Psychiatric: He has a normal mood and affect. His behavior is normal. Judgment and thought content normal.  Nursing note and vitals reviewed. + pedal pulses palpated b/l but weak Both feet black and blue b/l -- pt thinks he hit them on something BP 92/60 (BP Location: Right Arm, Patient Position: Sitting, Cuff Size: Normal)   Pulse 71   Temp 98.6 F (37 C) (Oral)   Resp 16   Ht 5\' 6"  (1.676 m)   Wt 179 lb 3.2 oz (81.3 kg)   SpO2 95%   BMI 28.92 kg/m  Wt Readings from Last 3 Encounters:  07/08/16 179 lb 3.2 oz (81.3 kg)  06/16/16 183 lb (83 kg)  06/09/16 184 lb 6.4 oz (83.6 kg)     Lab Results  Component Value Date   WBC 6.1 06/16/2016   HGB 10.2  (L) 06/16/2016   HCT 32.2 (L) 06/16/2016   PLT 157.0 06/16/2016   GLUCOSE 101 (H) 06/16/2016   CHOL 127 02/21/2016   TRIG 115.0 02/21/2016   HDL 30.30 (L) 02/21/2016   LDLDIRECT 140.4 04/14/2012   LDLCALC 74 02/21/2016   ALT 9 06/16/2016   AST 14 06/16/2016   NA 140 06/16/2016   K 3.4 (L) 06/16/2016   CL 101 06/16/2016   CREATININE 1.56 (H) 06/16/2016   BUN 38 (H) 06/16/2016   CO2 30 06/16/2016   TSH 0.772 11/12/2012   PSA 1.37 04/14/2012   INR 2.2 (H) 06/16/2016   HGBA1C 6.2 02/21/2016   MICROALBUR 2.2 (H) 10/30/2014    Dg Chest 2 View  Result Date: 06/16/2016 CLINICAL DATA:  Cough, chest congestion, and wheezing for the past week. History of CHF, COPD, pulmonary fibrosis, coronary artery disease, aortic stenosis. EXAM: CHEST  2 VIEW COMPARISON:  PA and lateral chest x-ray of February 02, 2015 FINDINGS: The lungs are well-expanded. The interstitial markings are increased diffusely. Density at the right lung base has increased. There is no significant pleural effusion and no pneumothorax. The cardiac silhouette is enlarged. The pulmonary vascularity is not clearly engorged. There is calcification in the wall of the aortic arch. The thoracic vertebral bodies are preserved in height. There is multilevel degenerative disc disease of the thoracic spine. IMPRESSION: Chronic pulmonary fibrotic changes with probable superimposed atelectasis or pneumonia in the right lower lobe. Followup PA and lateral chest X-ray is recommended in 3-4 weeks following trial of antibiotic therapy to ensure resolution and exclude underlying malignancy. Cardiomegaly, chronic.  No definite pulmonary edema. Thoracic aortic atherosclerosis. Electronically Signed   By: David  Martinique M.D.   On: 06/16/2016 11:12     Assessment & Plan:  Plan  I am having Mr. Anand maintain his calcitRIOL, Azelastine HCl, ipratropium-albuterol, NONFORMULARY OR COMPOUNDED ITEM, budesonide-formoterol, allopurinol, furosemide, losartan,  pravastatin, bisoprolol, warfarin, Vitamin D, azithromycin, tiotropium, and levofloxacin. We will continue to administer ipratropium-albuterol.  No orders of the defined types were placed in this encounter.   Problem List Items Addressed This Visit      Unprioritized   Atrial fibrillation ([place]) - Primary   COPD (chronic obstructive pulmonary disease) ([place])   Essential hypertension    Stable con't losartan      Relevant Orders   CBC with Differential/Platelet   Comprehensive metabolic panel   POCT urinalysis dipstick (Completed)   Persistent atrial fibrillation ([facility]) (Chronic)    Per cardiology On coumadin        Other Visit Diagnoses    Hyperlipidemia LDL goal <100       Relevant Orders   Lipid panel   Comprehensive metabolic panel   POCT urinalysis dipstick (Completed)   Nocturia       Relevant Orders   PSA   Bilateral calf pain       Relevant Orders   [place] Extrem Low Bilat Comp   Varicose veins of both lower extremities       Relevant Orders   [place] Extrem Low Bilat Comp   Blurry vision       Relevant Orders   Ambulatory referral to Ophthalmology   Need for prophylactic vaccination against Streptococcus pneumoniae (pneumococcus)       Relevant Orders   Pneumococcal polysaccharide vaccine 23-valent greater than or equal to 2yo subcutaneous/IM (Completed)   Hematuria, unspecified type       Relevant Orders   Urine culture      Follow-up: Return in about 3 months (around 10/06/2016).  Ann Held, DO

## 2016-07-08 NOTE — Telephone Encounter (Signed)
Please attempt to get patient on the phone and give him number for cardiology-- he needs f/u appointment-- he may have been in the hosp when they called    ----- Message -----  From: Charlett Nose  Sent: 07/03/2016  4:13 PM  To: Ann Held, DO          This message is to inform you that we have made 4 consecutive attempts to contact the patient. Although we were unsuccessful in these attempts we wanted you to be aware of our efforts. Will remove the patient from our [place] at this time. .      Thank You,  [facility]   Called patient and left a message for call back.

## 2016-07-08 NOTE — Progress Notes (Signed)
Pre visit review using our clinic review tool, if applicable. No additional management support is needed unless otherwise documented below in the visit note. 

## 2016-07-08 NOTE — Patient Instructions (Signed)
Preventive Care 80 Years and Older, Male Preventive care refers to lifestyle choices and visits with your health care provider that can promote health and wellness. What does preventive care include?  A yearly physical exam. This is also called an annual well check.  Dental exams once or twice a year.  Routine eye exams. Ask your health care provider how often you should have your eyes checked.  Personal lifestyle choices, including:  Daily care of your teeth and gums.  Regular physical activity.  Eating a healthy diet.  Avoiding tobacco and drug use.  Limiting alcohol use.  Practicing safe sex.  Taking low doses of aspirin every day.  Taking vitamin and mineral supplements as recommended by your health care provider. What happens during an annual well check? The services and screenings done by your health care provider during your annual well check will depend on your age, overall health, lifestyle risk factors, and family history of disease. Counseling  Your health care provider may ask you questions about your:  Alcohol use.  Tobacco use.  Drug use.  Emotional well-being.  Home and relationship well-being.  Sexual activity.  Eating habits.  History of falls.  Memory and ability to understand (cognition).  Work and work environment. Screening  You may have the following tests or measurements:  Height, weight, and BMI.  Blood pressure.  Lipid and cholesterol levels. These may be checked every 5 years, or more frequently if you are over 50 years old.  Skin check.  Lung cancer screening. You may have this screening every year starting at age 55 if you have a 30-pack-year history of smoking and currently smoke or have quit within the past 15 years.  Fecal occult blood test (FOBT) of the stool. You may have this test every year starting at age 50.  Flexible sigmoidoscopy or colonoscopy. You may have a sigmoidoscopy every 5 years or a colonoscopy every 10  years starting at age 50.  Prostate cancer screening. Recommendations will vary depending on your family history and other risks.  Hepatitis C blood test.  Hepatitis B blood test.  Sexually transmitted disease (STD) testing.  Diabetes screening. This is done by checking your blood sugar (glucose) after you have not eaten for a while (fasting). You may have this done every 1-3 years.  Abdominal aortic aneurysm (AAA) screening. You may need this if you are a current or former smoker.  Osteoporosis. You may be screened starting at age 70 if you are at high risk. Talk with your health care provider about your test results, treatment options, and if necessary, the need for more tests. Vaccines  Your health care provider may recommend certain vaccines, such as:  Influenza vaccine. This is recommended every year.  Tetanus, diphtheria, and acellular pertussis (Tdap, Td) vaccine. You may need a Td booster every 10 years.  Varicella vaccine. You may need this if you have not been vaccinated.  Zoster vaccine. You may need this after age 60.  Measles, mumps, and rubella (MMR) vaccine. You may need at least one dose of MMR if you were born in 1957 or later. You may also need a second dose.  Pneumococcal 13-valent conjugate (PCV13) vaccine. One dose is recommended after age 65.  Pneumococcal polysaccharide (PPSV23) vaccine. One dose is recommended after age 65.  Meningococcal vaccine. You may need this if you have certain conditions.  Hepatitis A vaccine. You may need this if you have certain conditions or if you travel or work in places where   you may be exposed to hepatitis A.  Hepatitis B vaccine. You may need this if you have certain conditions or if you travel or work in places where you may be exposed to hepatitis B.  Haemophilus influenzae type b (Hib) vaccine. You may need this if you have certain risk factors. Talk to your health care provider about which screenings and vaccines you  need and how often you need them. This information is not intended to replace advice given to you by your health care provider. Make sure you discuss any questions you have with your health care provider. Document Released: 08/17/2015 Document Revised: 04/09/2016 Document Reviewed: 05/22/2015 Elsevier Interactive Patient Education  2017 [employer].

## 2016-07-08 NOTE — Telephone Encounter (Signed)
Pt and daughter here in the office.  Daughter confirmed that patient has an appt with Cardio on 08/07/16.

## 2016-07-09 LAB — COMPREHENSIVE METABOLIC PANEL
ALT: 13 U/L (ref 0–53)
AST: 24 U/L (ref 0–37)
Albumin: 3.4 g/dL — ABNORMAL LOW (ref 3.5–5.2)
Alkaline Phosphatase: 138 U/L — ABNORMAL HIGH (ref 39–117)
BILIRUBIN TOTAL: 0.7 mg/dL (ref 0.2–1.2)
BUN: 58 mg/dL — AB (ref 6–23)
CO2: 31 meq/L (ref 19–32)
Calcium: 9.1 mg/dL (ref 8.4–10.5)
Chloride: 99 mEq/L (ref 96–112)
Creatinine, Ser: 1.89 mg/dL — ABNORMAL HIGH (ref 0.40–1.50)
GFR: 36.23 mL/min — AB (ref >60.00)
GLUCOSE: 90 mg/dL (ref 70–99)
Potassium: 4.3 mEq/L (ref 3.5–5.1)
SODIUM: 140 meq/L (ref 135–145)
Total Protein: 7.2 g/dL (ref 6.0–8.3)

## 2016-07-09 LAB — LIPID PANEL
CHOL/HDL RATIO: 4
Cholesterol: 131 mg/dL (ref 0–200)
HDL: 35.7 mg/dL — AB (ref >39.00)
LDL CALC: 64 mg/dL (ref 0–99)
NONHDL: 95.67
TRIGLYCERIDES: 157 mg/dL — AB (ref 0.0–149.0)
VLDL: 31.4 mg/dL (ref 0.0–40.0)

## 2016-07-09 LAB — CBC WITH DIFFERENTIAL/PLATELET
BASOS ABS: 0.1 10*3/uL (ref 0.0–0.1)
Basophils Relative: 1 % (ref 0.0–3.0)
EOS ABS: 0.6 10*3/uL (ref 0.0–0.7)
Eosinophils Relative: 6.8 % — ABNORMAL HIGH (ref 0.0–5.0)
HCT: 35.8 % — ABNORMAL LOW (ref 39.0–52.0)
Hemoglobin: 11.6 g/dL — ABNORMAL LOW (ref 13.0–17.0)
LYMPHS ABS: 1.6 10*3/uL (ref 0.7–4.0)
Lymphocytes Relative: 18 % (ref 12.0–46.0)
MCHC: 32.4 g/dL (ref 30.0–36.0)
MCV: 80.9 fl (ref 78.0–100.0)
MONO ABS: 0.8 10*3/uL (ref 0.1–1.0)
Monocytes Relative: 9.2 % (ref 3.0–12.0)
NEUTROS ABS: 5.9 10*3/uL (ref 1.4–7.7)
NEUTROS PCT: 65 % (ref 43.0–77.0)
PLATELETS: 168 10*3/uL (ref 150.0–400.0)
RBC: 4.42 Mil/uL (ref 4.22–5.81)
RDW: 16.4 % — ABNORMAL HIGH (ref 11.5–15.5)
WBC: 9.1 10*3/uL (ref 4.0–10.5)

## 2016-07-09 LAB — URINE CULTURE

## 2016-07-09 LAB — PSA: PSA: 1.68 ng/mL (ref 0.10–4.00)

## 2016-07-09 MED FILL — SPIRIVA 18 MCG CP-HANDIHALE: 18 | 30 days supply | Qty: 30 | Fill #1

## 2016-07-10 ENCOUNTER — Ambulatory Visit (HOSPITAL_COMMUNITY)
Admission: RE | Admit: 2016-07-10 | Discharge: 2016-07-10 | Disposition: A | Discharge status: Home / Self Care | Payer: Medicare FFS | Source: Ambulatory Visit | Attending: Internal Medicine | Admitting: Internal Medicine

## 2016-07-10 VITALS — BP 108/80 | HR 86 | Wt 183.8 lb

## 2016-07-10 DIAGNOSIS — N183 Chronic kidney disease, stage 3 unspecified: Secondary | ICD-10-CM

## 2016-07-10 DIAGNOSIS — Z79899 Other long term (current) drug therapy: Secondary | ICD-10-CM | POA: Diagnosis not present

## 2016-07-10 DIAGNOSIS — I130 Hypertensive heart and chronic kidney disease with heart failure and stage 1 through stage 4 chronic kidney disease, or unspecified chronic kidney disease: Secondary | ICD-10-CM | POA: Diagnosis not present

## 2016-07-10 DIAGNOSIS — I350 Nonrheumatic aortic (valve) stenosis: Secondary | ICD-10-CM | POA: Diagnosis not present

## 2016-07-10 DIAGNOSIS — I481 Persistent atrial fibrillation: Secondary | ICD-10-CM | POA: Diagnosis not present

## 2016-07-10 DIAGNOSIS — I714 Abdominal aortic aneurysm, without rupture: Secondary | ICD-10-CM | POA: Diagnosis not present

## 2016-07-10 DIAGNOSIS — I5042 Chronic combined systolic (congestive) and diastolic (congestive) heart failure: Secondary | ICD-10-CM | POA: Diagnosis not present

## 2016-07-10 DIAGNOSIS — I712 Thoracic aortic aneurysm, without rupture: Secondary | ICD-10-CM | POA: Insufficient documentation

## 2016-07-10 DIAGNOSIS — I2510 Atherosclerotic heart disease of native coronary artery without angina pectoris: Secondary | ICD-10-CM | POA: Insufficient documentation

## 2016-07-10 DIAGNOSIS — Z5189 Encounter for other specified aftercare: Secondary | ICD-10-CM | POA: Diagnosis present

## 2016-07-10 DIAGNOSIS — I5022 Chronic systolic (congestive) heart failure: Secondary | ICD-10-CM | POA: Insufficient documentation

## 2016-07-10 DIAGNOSIS — I255 Ischemic cardiomyopathy: Secondary | ICD-10-CM | POA: Diagnosis not present

## 2016-07-10 DIAGNOSIS — I482 Chronic atrial fibrillation: Secondary | ICD-10-CM | POA: Insufficient documentation

## 2016-07-10 DIAGNOSIS — I739 Peripheral vascular disease, unspecified: Secondary | ICD-10-CM | POA: Diagnosis not present

## 2016-07-10 DIAGNOSIS — I4819 Other persistent atrial fibrillation: Secondary | ICD-10-CM

## 2016-07-10 NOTE — Progress Notes (Signed)
Advanced Heart Failure Medication Review by a Pharmacist  Does the patient  feel that his/her medications are working for him/her?  yes  Has the patient been experiencing any side effects to the medications prescribed?  no  Does the patient measure his/her own blood pressure or blood glucose at home?  yes   Does the patient have any problems obtaining medications due to transportation or finances?   no  Understanding of regimen: good Understanding of indications: good Potential of compliance: good Patient understands to avoid NSAIDs. Patient understands to avoid decongestants.  Issues to address at subsequent visits: None   Pharmacist comments:  Joe Jackson is a pleasant 82 yo M presenting with his daughter and a current medication list. He reports good compliance with his regimen and did not have any specific medication-related questions or concerns for me at this time.   Ruta Hinds. Velva Harman, PharmD, BCPS, CPP Clinical Pharmacist Pager: [phone] Phone: [phone] 07/10/2016 10:28 AM      Time with patient: 10 minutes Preparation and documentation time: 2 minutes Total time: 12 minutes

## 2016-07-10 NOTE — Patient Instructions (Addendum)
No changes to medication.  No lab work today.  Will schedule you for an echocardiogram at [facility]. Address: [address] ([place]), [place], [place] [address]  Phone: [phone]  Follow up with Dr. Aundra Dubin in 3 months. We will call you closer to this time, or you may call our office to schedule 1 month before you are due to be seen.  Do the following things EVERYDAY: 1) Weigh yourself in the morning before breakfast. Write it down and keep it in a log. 2) Take your medicines as prescribed 3) Eat low salt foods-Limit salt (sodium) to 2000 mg per day.  4) Stay as active as you can everyday 5) Limit all fluids for the day to less than 2 liters
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
## 2016-07-15 ENCOUNTER — Other Ambulatory Visit: Payer: Self-pay

## 2016-07-15 ENCOUNTER — Ambulatory Visit (HOSPITAL_COMMUNITY): Payer: Medicare FFS | Attending: Cardiology

## 2016-07-15 DIAGNOSIS — I5042 Chronic combined systolic (congestive) and diastolic (congestive) heart failure: Secondary | ICD-10-CM | POA: Insufficient documentation

## 2016-07-15 DIAGNOSIS — I351 Nonrheumatic aortic (valve) insufficiency: Secondary | ICD-10-CM | POA: Diagnosis not present

## 2016-07-25 ENCOUNTER — Other Ambulatory Visit: Payer: Self-pay | Admitting: *Deleted

## 2016-07-25 DIAGNOSIS — I872 Venous insufficiency (chronic) (peripheral): Secondary | ICD-10-CM

## 2016-07-25 MED FILL — INCRUSE ELLIPTA 62.5 MCG IN: 62.5 | 30 days supply | Qty: 30 | Fill #0

## 2016-07-25 MED FILL — BREO ELLIPTA 200-25 MCG INH: 200-25 | 30 days supply | Qty: 60 | Fill #0

## 2016-08-06 ENCOUNTER — Telehealth: Payer: Self-pay | Admitting: Cardiology

## 2016-08-07 ENCOUNTER — Ambulatory Visit: Payer: Medicare FFS | Admitting: Cardiology

## 2016-08-08 NOTE — Telephone Encounter (Signed)
Closed encounter °

## 2016-08-12 ENCOUNTER — Institutional Professional Consult (permissible substitution): Payer: Medicare FFS | Admitting: Pulmonary Disease

## 2016-08-28 ENCOUNTER — Telehealth: Payer: Self-pay | Admitting: Family Medicine

## 2016-08-28 NOTE — Telephone Encounter (Signed)
[facility] Relationship to patient: Can be reached:[phone] Pharmacy:  Reason for call:Patient is being discharged today and needs PT INR checked in 2days. Does have hospital follow up scheduled for 09/04/16 w/ [facility](Dr Etter Sjogren did not have anything available in 1 week).

## 2016-08-29 ENCOUNTER — Ambulatory Visit: Payer: Medicare FFS

## 2016-08-29 NOTE — Telephone Encounter (Signed)
No sure what ? Is--- he needs pt check Monday?

## 2016-08-29 NOTE — Telephone Encounter (Signed)
noted 
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
## 2016-09-02 ENCOUNTER — Encounter (HOSPITAL_COMMUNITY): Payer: Medicare FFS

## 2016-09-02 ENCOUNTER — Encounter: Payer: Medicare FFS | Admitting: Vascular Surgery

## 2016-09-04 ENCOUNTER — Ambulatory Visit: Payer: Medicare FFS | Admitting: Medical

## 2016-09-04 DEATH — deceased

## 2016-09-24 ENCOUNTER — Institutional Professional Consult (permissible substitution): Payer: Medicare FFS | Admitting: Pulmonary Disease

## 2016-09-30 ENCOUNTER — Encounter: Payer: Medicare FFS | Admitting: Vascular Surgery

## 2016-09-30 ENCOUNTER — Encounter (HOSPITAL_COMMUNITY): Payer: Medicare FFS

## 2016-10-06 ENCOUNTER — Ambulatory Visit: Payer: Medicare FFS | Admitting: Family Medicine

## 2016-10-17 ENCOUNTER — Other Ambulatory Visit: Payer: Self-pay | Admitting: Family Medicine
